# Patient Record
Sex: Male | Born: 1960 | Race: Black or African American | Hispanic: No | Marital: Single | Smoking: Current every day smoker
Health system: Southern US, Community
[De-identification: ages and names within clinical notes are randomized; demographics above are authoritative.]

## PROBLEM LIST (undated history)

## (undated) DIAGNOSIS — C801 Malignant (primary) neoplasm, unspecified: Secondary | ICD-10-CM

---

## 2015-09-04 ENCOUNTER — Emergency Department (HOSPITAL_COMMUNITY)
Admission: EM | Admit: 2015-09-04 | Discharge: 2015-09-04 | Disposition: A | Discharge status: Home / Self Care | Payer: BLUE CROSS/BLUE SHIELD | Attending: Emergency Medicine | Admitting: Emergency Medicine

## 2015-09-04 ENCOUNTER — Encounter (HOSPITAL_COMMUNITY): Payer: Self-pay | Admitting: Emergency Medicine

## 2015-09-04 ENCOUNTER — Emergency Department (HOSPITAL_COMMUNITY): Payer: BLUE CROSS/BLUE SHIELD

## 2015-09-04 DIAGNOSIS — F17210 Nicotine dependence, cigarettes, uncomplicated: Secondary | ICD-10-CM | POA: Insufficient documentation

## 2015-09-04 DIAGNOSIS — M25562 Pain in left knee: Secondary | ICD-10-CM | POA: Diagnosis not present

## 2015-09-04 MED ORDER — ACETAMINOPHEN 500 MG PO TABS
1000.0000 mg | ORAL_TABLET | Freq: Once | ORAL | Status: AC
  Administered 2015-09-04: 1000 mg via ORAL
  Filled 2015-09-04: qty 2

## 2015-09-04 MED ORDER — NAPROXEN 500 MG PO TABS: 500.0000 mg | ORAL_TABLET | Freq: Two times a day (BID) | ORAL | Status: DC

## 2015-09-04 MED ORDER — IBUPROFEN 400 MG PO TABS
800.0000 mg | ORAL_TABLET | Freq: Once | ORAL | Status: AC
  Administered 2015-09-04: 800 mg via ORAL
  Filled 2015-09-04: qty 2

## 2015-09-04 MED ORDER — ACETAMINOPHEN 500 MG PO TABS: 500.0000 mg | ORAL_TABLET | Freq: Four times a day (QID) | ORAL | Status: AC | PRN

## 2015-09-04 NOTE — ED Provider Notes (Signed)
CSN: [id number]     Arrival date & time 09/04/15  2993 History   First MD Initiated Contact with Patient 09/04/15 [phone]     Chief Complaint  Patient presents with  . Knee Pain     (Consider location/radiation/quality/duration/timing/severity/associated sxs/prior Treatment) Patient is a 55 y.o. male presenting with knee pain. The history is provided by the patient.  Knee Pain Location:  Knee Time since incident:  2 months Injury: no   Knee location:  L knee Pain details:    Quality:  Aching ("feels like someone stuck a knife in my knee and twisting it")   Radiates to:  Does not radiate   Severity:  Moderate   Onset quality:  Gradual   Timing:  Constant   Progression:  Worsening Dislocation: no   Prior injury to area:  No Relieved by:  NSAIDs Worsened by:  Nothing tried Associated symptoms: decreased ROM (secondary to pain)   Associated symptoms: no fever, no muscle weakness, no numbness, no swelling and no tingling    Patient presents with left knee pain x 2 months without known injury/trauma.  Patient ambulatory.  Pain relieved by [facility] powder, advil, and aleve.  No swelling, color change, fever, chills, numbness, or weakness.  History reviewed. No pertinent past medical history. History reviewed. No pertinent past surgical history. No family history on file. Social History  Substance Use Topics  . Smoking status: Current Every Day Smoker    Types: Cigars  . Smokeless tobacco: None  . Alcohol Use: Yes    Review of Systems  Constitutional: Negative for fever.  Cardiovascular: Negative for leg swelling.  Musculoskeletal: Positive for arthralgias (left knee) and gait problem (secondary to pain). Negative for joint swelling.  Skin: Negative for color change.  Neurological: Negative for weakness and numbness.  All other systems reviewed and are negative.     Allergies  Aleve  Home Medications   Prior to Admission medications   Medication Sig Start Date End Date  Taking? Authorizing Provider  acetaminophen (TYLENOL) 500 MG tablet Take 1 tablet (500 mg total) by mouth every 6 (six) hours as needed. 09/04/15   Gloriann Loan, PA-C  naproxen (NAPROSYN) 500 MG tablet Take 1 tablet (500 mg total) by mouth 2 (two) times daily. 09/04/15   Noble Bodie, PA-C   BP 118/70 mmHg  Pulse 51  Temp(Src) 97.7 F (36.5 C) (Oral)  Resp 18  SpO2 100% Physical Exam  Constitutional: He is oriented to person, place, and time. He appears well-developed and well-nourished.  HENT:  Head: Atraumatic.  Eyes: Conjunctivae are normal. No scleral icterus.  Neck: No tracheal deviation present.  Cardiovascular: Normal rate, regular rhythm and normal heart sounds.   Pulses:      Posterior tibial pulses are 2+ on the right side, and 2+ on the left side.  Pulmonary/Chest: Effort normal and breath sounds normal. No respiratory distress.  Abdominal: He exhibits no distension.  Musculoskeletal: He exhibits tenderness. He exhibits no edema.       Left knee: He exhibits decreased range of motion (secondary to pain). He exhibits no swelling, no effusion, no ecchymosis, no deformity, no laceration, no erythema, normal alignment, no LCL laxity, normal patellar mobility and no MCL laxity. Tenderness found.       Legs: Neurological: He is alert and oriented to person, place, and time.  Strength and sensation intact bilaterally throughout lower extremities.   Skin: Skin is warm and dry. No erythema.  No swelling, warmth, erythema.  Psychiatric: He  has a normal mood and affect. His behavior is normal.    ED Course  Procedures (including critical care time) Labs Review Labs Reviewed - No data to display  Imaging Review Dg Knee Complete 4 Views Left  09/04/2015  CLINICAL DATA:  55 year old male with knee pain for 2 months with no known injury. Initial encounter. EXAM: LEFT KNEE - COMPLETE 4+ VIEW COMPARISON:  None. FINDINGS: Mild medial compartment joint space loss. Lateral and patellofemoral  compartments are preserved. No significant degenerative spurring. There does appear to be suprapatellar soft tissue stranding, and anterior soft tissue thickening at the knee. Small joint effusion not excluded. Bone mineralization is within normal limits. Patella intact. No acute osseous abnormality identified. IMPRESSION: Mild degenerative changes at the medial compartment. Suprapatellar small joint effusion or soft tissue stranding. No acute osseous abnormality identified. Electronically Signed   By: Genevie Ann M.D.   On: 09/04/2015 10:18   I have personally reviewed and evaluated these images and lab results as part of my medical decision-making.   EKG Interpretation None      MDM   Final diagnoses:  Left knee pain    Patient presents with left knee pain x 2 months.  No injury.  No prior injury/surgery.  No swelling, warmth, erythema, ecchymosis.  Patient ambulatory.  TTP along lateral left knee.  Decreased ROM secondary to pain.  Intact distal pulses.  Will obtain plain films of left knee.  Patient given ibuprofen and tylenol in ED.    Plain films show mild degenerative changes at the medial compartment.  Suprapatellar small joint effusion or soft tissue stranding.  No acte osseous abnormality.    Patient given knee sleeve.  Recommend Aleve and Tylenol for pain.  Follow up orthopedics.  Discussed return precautions.  Patient agrees and acknowledges the above plan for discharge.    Gloriann Loan, PA-C 09/04/15 1036  Julianne Rice, MD 09/04/15 1539

## 2015-09-04 NOTE — Discharge Instructions (Signed)

## 2015-09-04 NOTE — ED Notes (Signed)
Patient states has had L knee pain x 2 months.   Patient states he has been taking Goody's, BC's, and advil for pain.   Patient denies injury.  Denies swelling.

## 2015-12-06 ENCOUNTER — Emergency Department (HOSPITAL_COMMUNITY): Payer: BLUE CROSS/BLUE SHIELD

## 2015-12-06 ENCOUNTER — Inpatient Hospital Stay (HOSPITAL_COMMUNITY)
Admission: EM | Admit: 2015-12-06 | Discharge: 2015-12-16 | DRG: 481 | Disposition: A | Discharge status: Rehabilitation Facility | Payer: BLUE CROSS/BLUE SHIELD | Attending: Internal Medicine | Admitting: Internal Medicine

## 2015-12-06 ENCOUNTER — Encounter (HOSPITAL_COMMUNITY): Payer: Self-pay | Admitting: Radiology

## 2015-12-06 ENCOUNTER — Inpatient Hospital Stay (HOSPITAL_COMMUNITY): Payer: BLUE CROSS/BLUE SHIELD

## 2015-12-06 DIAGNOSIS — Z419 Encounter for procedure for purposes other than remedying health state, unspecified: Secondary | ICD-10-CM

## 2015-12-06 DIAGNOSIS — D539 Nutritional anemia, unspecified: Secondary | ICD-10-CM | POA: Diagnosis present

## 2015-12-06 DIAGNOSIS — Z590 Homelessness: Secondary | ICD-10-CM | POA: Diagnosis not present

## 2015-12-06 DIAGNOSIS — S72302A Unspecified fracture of shaft of left femur, initial encounter for closed fracture: Secondary | ICD-10-CM | POA: Insufficient documentation

## 2015-12-06 DIAGNOSIS — R1084 Generalized abdominal pain: Secondary | ICD-10-CM | POA: Diagnosis not present

## 2015-12-06 DIAGNOSIS — E876 Hypokalemia: Secondary | ICD-10-CM | POA: Diagnosis not present

## 2015-12-06 DIAGNOSIS — D62 Acute posthemorrhagic anemia: Secondary | ICD-10-CM | POA: Diagnosis not present

## 2015-12-06 DIAGNOSIS — M7989 Other specified soft tissue disorders: Secondary | ICD-10-CM

## 2015-12-06 DIAGNOSIS — Z79899 Other long term (current) drug therapy: Secondary | ICD-10-CM | POA: Diagnosis not present

## 2015-12-06 DIAGNOSIS — M84552A Pathological fracture in neoplastic disease, left femur, initial encounter for fracture: Secondary | ICD-10-CM | POA: Diagnosis present

## 2015-12-06 DIAGNOSIS — M799 Soft tissue disorder, unspecified: Secondary | ICD-10-CM | POA: Diagnosis not present

## 2015-12-06 DIAGNOSIS — M25469 Effusion, unspecified knee: Secondary | ICD-10-CM

## 2015-12-06 DIAGNOSIS — T148XXA Other injury of unspecified body region, initial encounter: Secondary | ICD-10-CM | POA: Insufficient documentation

## 2015-12-06 DIAGNOSIS — E46 Unspecified protein-calorie malnutrition: Secondary | ICD-10-CM | POA: Diagnosis present

## 2015-12-06 DIAGNOSIS — C7931 Secondary malignant neoplasm of brain: Secondary | ICD-10-CM | POA: Diagnosis present

## 2015-12-06 DIAGNOSIS — S7292XA Unspecified fracture of left femur, initial encounter for closed fracture: Secondary | ICD-10-CM | POA: Diagnosis present

## 2015-12-06 DIAGNOSIS — Z681 Body mass index (BMI) 19 or less, adult: Secondary | ICD-10-CM

## 2015-12-06 DIAGNOSIS — Z01811 Encounter for preprocedural respiratory examination: Secondary | ICD-10-CM

## 2015-12-06 DIAGNOSIS — S7290XA Unspecified fracture of unspecified femur, initial encounter for closed fracture: Secondary | ICD-10-CM | POA: Diagnosis present

## 2015-12-06 DIAGNOSIS — M868X5 Other osteomyelitis, thigh: Secondary | ICD-10-CM | POA: Diagnosis not present

## 2015-12-06 DIAGNOSIS — R9431 Abnormal electrocardiogram [ECG] [EKG]: Secondary | ICD-10-CM

## 2015-12-06 DIAGNOSIS — R222 Localized swelling, mass and lump, trunk: Secondary | ICD-10-CM | POA: Diagnosis present

## 2015-12-06 DIAGNOSIS — R609 Edema, unspecified: Secondary | ICD-10-CM | POA: Diagnosis not present

## 2015-12-06 DIAGNOSIS — J432 Centrilobular emphysema: Secondary | ICD-10-CM | POA: Diagnosis present

## 2015-12-06 DIAGNOSIS — N5089 Other specified disorders of the male genital organs: Secondary | ICD-10-CM | POA: Insufficient documentation

## 2015-12-06 DIAGNOSIS — E279 Disorder of adrenal gland, unspecified: Secondary | ICD-10-CM | POA: Diagnosis present

## 2015-12-06 DIAGNOSIS — C799 Secondary malignant neoplasm of unspecified site: Secondary | ICD-10-CM | POA: Diagnosis present

## 2015-12-06 DIAGNOSIS — Z801 Family history of malignant neoplasm of trachea, bronchus and lung: Secondary | ICD-10-CM | POA: Diagnosis not present

## 2015-12-06 DIAGNOSIS — F17290 Nicotine dependence, other tobacco product, uncomplicated: Secondary | ICD-10-CM | POA: Diagnosis present

## 2015-12-06 DIAGNOSIS — C3490 Malignant neoplasm of unspecified part of unspecified bronchus or lung: Secondary | ICD-10-CM

## 2015-12-06 DIAGNOSIS — M79605 Pain in left leg: Secondary | ICD-10-CM | POA: Diagnosis present

## 2015-12-06 DIAGNOSIS — C3431 Malignant neoplasm of lower lobe, right bronchus or lung: Secondary | ICD-10-CM | POA: Diagnosis present

## 2015-12-06 DIAGNOSIS — C7972 Secondary malignant neoplasm of left adrenal gland: Secondary | ICD-10-CM | POA: Diagnosis not present

## 2015-12-06 DIAGNOSIS — R52 Pain, unspecified: Secondary | ICD-10-CM | POA: Insufficient documentation

## 2015-12-06 DIAGNOSIS — E871 Hypo-osmolality and hyponatremia: Secondary | ICD-10-CM | POA: Diagnosis not present

## 2015-12-06 DIAGNOSIS — Z9889 Other specified postprocedural states: Secondary | ICD-10-CM | POA: Diagnosis not present

## 2015-12-06 DIAGNOSIS — F4321 Adjustment disorder with depressed mood: Secondary | ICD-10-CM | POA: Diagnosis not present

## 2015-12-06 DIAGNOSIS — M25462 Effusion, left knee: Secondary | ICD-10-CM | POA: Diagnosis not present

## 2015-12-06 DIAGNOSIS — D179 Benign lipomatous neoplasm, unspecified: Secondary | ICD-10-CM | POA: Diagnosis not present

## 2015-12-06 DIAGNOSIS — Z515 Encounter for palliative care: Secondary | ICD-10-CM | POA: Diagnosis not present

## 2015-12-06 DIAGNOSIS — Z7189 Other specified counseling: Secondary | ICD-10-CM | POA: Diagnosis not present

## 2015-12-06 DIAGNOSIS — D638 Anemia in other chronic diseases classified elsewhere: Secondary | ICD-10-CM | POA: Diagnosis not present

## 2015-12-06 DIAGNOSIS — R64 Cachexia: Secondary | ICD-10-CM | POA: Diagnosis not present

## 2015-12-06 DIAGNOSIS — S72322A Displaced transverse fracture of shaft of left femur, initial encounter for closed fracture: Secondary | ICD-10-CM | POA: Diagnosis not present

## 2015-12-06 DIAGNOSIS — M84552S Pathological fracture in neoplastic disease, left femur, sequela: Secondary | ICD-10-CM | POA: Diagnosis not present

## 2015-12-06 DIAGNOSIS — C801 Malignant (primary) neoplasm, unspecified: Secondary | ICD-10-CM | POA: Diagnosis not present

## 2015-12-06 DIAGNOSIS — C7800 Secondary malignant neoplasm of unspecified lung: Secondary | ICD-10-CM

## 2015-12-06 DIAGNOSIS — C7801 Secondary malignant neoplasm of right lung: Secondary | ICD-10-CM | POA: Diagnosis not present

## 2015-12-06 DIAGNOSIS — N433 Hydrocele, unspecified: Secondary | ICD-10-CM | POA: Diagnosis present

## 2015-12-06 DIAGNOSIS — K5900 Constipation, unspecified: Secondary | ICD-10-CM | POA: Diagnosis present

## 2015-12-06 DIAGNOSIS — C7951 Secondary malignant neoplasm of bone: Secondary | ICD-10-CM | POA: Diagnosis not present

## 2015-12-06 LAB — HEPATIC FUNCTION PANEL
ALT: 8 U/L — ABNORMAL LOW (ref 17–63)
AST: 14 U/L — AB (ref 15–41)
Albumin: 2.9 g/dL — ABNORMAL LOW (ref 3.5–5.0)
Alkaline Phosphatase: 71 U/L (ref 38–126)
Total Bilirubin: 0.2 mg/dL — ABNORMAL LOW (ref 0.3–1.2)
Total Protein: 6.1 g/dL — ABNORMAL LOW (ref 6.5–8.1)

## 2015-12-06 LAB — CBC
HEMATOCRIT: 32.3 % — AB (ref 39.0–52.0)
HEMOGLOBIN: 10.6 g/dL — AB (ref 13.0–17.0)
MCH: 33.2 pg (ref 26.0–34.0)
MCHC: 32.8 g/dL (ref 30.0–36.0)
MCV: 101.3 fL — AB (ref 78.0–100.0)
Platelets: 314 10*3/uL (ref 150–400)
RBC: 3.19 MIL/uL — ABNORMAL LOW (ref 4.22–5.81)
RDW: 14 % (ref 11.5–15.5)
WBC: 5.6 10*3/uL (ref 4.0–10.5)

## 2015-12-06 LAB — BASIC METABOLIC PANEL
Anion gap: 10 (ref 5–15)
BUN: 13 mg/dL (ref 6–20)
CALCIUM: 8.5 mg/dL — AB (ref 8.9–10.3)
CHLORIDE: 105 mmol/L (ref 101–111)
CO2: 23 mmol/L (ref 22–32)
CREATININE: 0.64 mg/dL (ref 0.61–1.24)
GFR calc Af Amer: >60 mL/min (ref >60)
GFR calc non Af Amer: >60 mL/min (ref >60)
GLUCOSE: 92 mg/dL (ref 65–99)
Potassium: 3.6 mmol/L (ref 3.5–5.1)
Sodium: 138 mmol/L (ref 135–145)

## 2015-12-06 LAB — PSA: PSA: 0.86 ng/mL (ref 0.00–4.00)

## 2015-12-06 MED ORDER — HYDROMORPHONE HCL 1 MG/ML IJ SOLN
1.0000 mg | INTRAMUSCULAR | Status: DC | PRN
  Administered 2015-12-06 – 2015-12-07 (×4): 1 mg via INTRAVENOUS
  Filled 2015-12-06 (×3): qty 1

## 2015-12-06 MED ORDER — ENOXAPARIN SODIUM 40 MG/0.4ML ~~LOC~~ SOLN
40.0000 mg | SUBCUTANEOUS | Status: DC
  Administered 2015-12-06: 40 mg via SUBCUTANEOUS
  Filled 2015-12-06: qty 0.4

## 2015-12-06 MED ORDER — HYDROMORPHONE HCL 1 MG/ML IJ SOLN
1.0000 mg | Freq: Once | INTRAMUSCULAR | Status: AC
  Administered 2015-12-06: 1 mg via INTRAVENOUS
  Filled 2015-12-06: qty 1

## 2015-12-06 MED ORDER — DIATRIZOATE MEGLUMINE & SODIUM 66-10 % PO SOLN
ORAL | Status: AC
  Filled 2015-12-06: qty 30

## 2015-12-06 MED ORDER — ONDANSETRON HCL 4 MG/2ML IJ SOLN: 4.0000 mg | Freq: Four times a day (QID) | INTRAMUSCULAR | Status: DC | PRN

## 2015-12-06 MED ORDER — IOPAMIDOL (ISOVUE-300) INJECTION 61%
INTRAVENOUS | Status: AC
  Administered 2015-12-06: 100 mL
  Filled 2015-12-06: qty 100

## 2015-12-06 MED ORDER — HYDROMORPHONE HCL 1 MG/ML IJ SOLN
1.0000 mg | INTRAMUSCULAR | Status: DC | PRN
  Administered 2015-12-06: 1 mg via INTRAVENOUS
  Filled 2015-12-06: qty 1

## 2015-12-06 MED ORDER — PROMETHAZINE HCL 25 MG PO TABS: 25.0000 mg | ORAL_TABLET | Freq: Four times a day (QID) | ORAL | Status: DC | PRN

## 2015-12-06 MED ORDER — MORPHINE SULFATE (PF) 4 MG/ML IV SOLN
4.0000 mg | Freq: Once | INTRAVENOUS | Status: AC
  Administered 2015-12-06: 4 mg via INTRAVENOUS
  Filled 2015-12-06: qty 1

## 2015-12-06 NOTE — ED Notes (Signed)
Pt transported to CT ?

## 2015-12-06 NOTE — ED Notes (Signed)
CT notified to pick up patient per PA, to not wait for oral contrast

## 2015-12-06 NOTE — ED Provider Notes (Signed)
CSN: [id number]     Arrival date & time 12/06/15  1449 History   First MD Initiated Contact with Patient 12/06/15 1459     No chief complaint on file.    (Consider location/radiation/quality/duration/timing/severity/associated sxs/prior Treatment) HPI Comments: Patient presents to the ED with a chief complaint of left leg pain.  Patient states that he missed a small step at work and felt his left thigh pop.  It was followed by intense pain.  He has been unable to walk since the injury.  He states that he has chronic knee pain, but denies any new injury or pain to his left knee.  He denies any other symptoms.  The pain is worsened with palpation and movement.  The history is provided by the patient. No language interpreter was used.    No past medical history on file. No past surgical history on file. No family history on file. Social History  Substance Use Topics  . Smoking status: Current Every Day Smoker    Types: Cigars  . Smokeless tobacco: Not on file  . Alcohol Use: Yes    Review of Systems  Constitutional: Negative for fever and chills.  Respiratory: Negative for shortness of breath.   Cardiovascular: Negative for chest pain.  Gastrointestinal: Negative for nausea, vomiting, diarrhea and constipation.  Genitourinary: Negative for dysuria.  Musculoskeletal: Positive for myalgias and arthralgias.  All other systems reviewed and are negative.     Allergies  Aleve  Home Medications   Prior to Admission medications   Medication Sig Start Date End Date Taking? Authorizing Provider  acetaminophen (TYLENOL) 500 MG tablet Take 1 tablet (500 mg total) by mouth every 6 (six) hours as needed. 09/04/15   Gloriann Loan, PA-C  naproxen (NAPROSYN) 500 MG tablet Take 1 tablet (500 mg total) by mouth 2 (two) times daily. 09/04/15   Gloriann Loan, PA-C   There were no vitals taken for this visit. Physical Exam  Constitutional: He is oriented to person, place, and time. He appears  well-developed and well-nourished.  HENT:  Head: Normocephalic and atraumatic.  Eyes: Conjunctivae and EOM are normal. Pupils are equal, round, and reactive to light. Right eye exhibits no discharge. Left eye exhibits no discharge. No scleral icterus.  Neck: Normal range of motion. Neck supple. No JVD present.  Cardiovascular: Normal rate, regular rhythm and normal heart sounds.  Exam reveals no gallop and no friction rub.   No murmur heard. Pulmonary/Chest: Effort normal and breath sounds normal. No respiratory distress. He has no wheezes. He has no rales. He exhibits no tenderness.  Abdominal: Soft. He exhibits no distension and no mass. There is no tenderness. There is no rebound and no guarding.  Musculoskeletal: Normal range of motion. He exhibits no edema or tenderness.  Left thigh remarkable for obvious deformity, tender to palpation, no leg shortening, unable to extend knee, no bony abnormality or deformity, ROM and strength limited by pain  Neurological: He is alert and oriented to person, place, and time.  Skin: Skin is warm and dry.  Psychiatric: He has a normal mood and affect. His behavior is normal. Judgment and thought content normal.  Nursing note and vitals reviewed.   ED Course  Procedures (including critical care time) Results for orders placed or performed during the hospital encounter of 12/06/15  CBC  Result Value Ref Range   WBC 5.6 4.0 - 10.5 K/uL   RBC 3.19 (L) 4.22 - 5.81 MIL/uL   Hemoglobin 10.6 (L) 13.0 - 17.0 g/dL  HCT 32.3 (L) 39.0 - 52.0 %   MCV 101.3 (H) 78.0 - 100.0 fL   MCH 33.2 26.0 - 34.0 pg   MCHC 32.8 30.0 - 36.0 g/dL   RDW 14.0 11.5 - 15.5 %   Platelets 314 150 - 400 K/uL  Basic metabolic panel  Result Value Ref Range   Sodium 138 135 - 145 mmol/L   Potassium 3.6 3.5 - 5.1 mmol/L   Chloride 105 101 - 111 mmol/L   CO2 23 22 - 32 mmol/L   Glucose, Bld 92 65 - 99 mg/dL   BUN 13 6 - 20 mg/dL   Creatinine, Ser 0.64 0.61 - 1.24 mg/dL   Calcium  8.5 (L) 8.9 - 10.3 mg/dL   GFR calc non Af Amer >60 >60 mL/min   GFR calc Af Amer >60 >60 mL/min   Anion gap 10 5 - 15  PSA  Result Value Ref Range   PSA 0.86 0.00 - 4.00 ng/mL  Hepatic function panel  Result Value Ref Range   Total Protein 6.1 (L) 6.5 - 8.1 g/dL   Albumin 2.9 (L) 3.5 - 5.0 g/dL   AST 14 (L) 15 - 41 U/L   ALT 8 (L) 17 - 63 U/L   Alkaline Phosphatase 71 38 - 126 U/L   Total Bilirubin 0.2 (L) 0.3 - 1.2 mg/dL   Bilirubin, Direct <0.1 (L) 0.1 - 0.5 mg/dL   Indirect Bilirubin NOT CALCULATED 0.3 - 0.9 mg/dL   Dg Chest 1 View  12/06/2015  CLINICAL DATA:  Left femur fracture. Preoperative respiratory examination. EXAM: CHEST 1 VIEW COMPARISON:  None. FINDINGS: The cardiomediastinal silhouette is unremarkable. Pulmonary vascular congestion is noted. There is no evidence of focal airspace disease, pulmonary edema, suspicious pulmonary nodule/mass, pleural effusion, or pneumothorax. No acute bony abnormalities are identified. IMPRESSION: Pulmonary vascular congestion. Electronically Signed   By: Margarette Canada M.D.   On: 12/06/2015 16:35   Ct Chest W Contrast  12/06/2015  CLINICAL DATA:  Multiple myeloma.  Fell today. EXAM: CT CHEST, ABDOMEN, AND PELVIS WITH CONTRAST TECHNIQUE: Multidetector CT imaging of the chest, abdomen and pelvis was performed following the standard protocol during bolus administration of intravenous contrast. CONTRAST:  122m ISOVUE-300 IOPAMIDOL (ISOVUE-300) INJECTION 61% COMPARISON:  None. FINDINGS: CT CHEST No pneumothorax. No effusions. No acute fracture. No intrathoracic vascular injury. No mediastinal or hilar adenopathy. Axillary regions are unremarkable. There is a 2.5 x 3.6 cm mass in the posterior basal segment of the right lower lobe, suspicious for neoplasm. Extensive centrilobular emphysematous changes are present in the upper lobes. CT ABDOMEN AND PELVIS No acute traumatic findings. No peritoneal blood or free air. Liver, spleen, pancreas and kidneys are  unremarkable and intact. Aorta and IVC are normal in caliber, intact. There are adrenal masses bilaterally, measuring approximately 2 cm. There also is a 2.2 cm mass at the upper pole the right kidney which might represent a far posterior right adrenal nodule. The adrenals are poorly defined due to the marked paucity of surrounding fat. Bowel is unremarkable. Small volume ascites. No acute fracture is evident in the chest, abdomen or pelvis. There is a destructive bone lesion with associated soft tissue mass at the anterior aspect of the left femur greater trochanter, seen to best advantage on coronal images 76 series 304. This soft tissue component measures 1.9 x 2.5 cm. There also are lytic defects of the left femoral subcapital and intertrochanteric regions which are worrisome for risk of pathologic fracture. IMPRESSION: 1. No evidence  of acute traumatic injury in the chest, abdomen or pelvis. 2. 2.5 x 3.6 cm right lower lobe lung mass, suspicious for neoplasm. 3. Bilateral adrenal masses. There also is a 2.2 cm right suprarenal mass more posteriorly which may represent an additional adrenal mass. 4. Lytic lesions of the left femoral neck and intertrochanteric region, worrisome for risk for pathologic fracture. Electronically Signed   By: Andreas Newport M.D.   On: 12/06/2015 19:46   Ct Abdomen Pelvis W Contrast  12/06/2015  CLINICAL DATA:  Multiple myeloma.  Fell today. EXAM: CT CHEST, ABDOMEN, AND PELVIS WITH CONTRAST TECHNIQUE: Multidetector CT imaging of the chest, abdomen and pelvis was performed following the standard protocol during bolus administration of intravenous contrast. CONTRAST:  158m ISOVUE-300 IOPAMIDOL (ISOVUE-300) INJECTION 61% COMPARISON:  None. FINDINGS: CT CHEST No pneumothorax. No effusions. No acute fracture. No intrathoracic vascular injury. No mediastinal or hilar adenopathy. Axillary regions are unremarkable. There is a 2.5 x 3.6 cm mass in the posterior basal segment of the right  lower lobe, suspicious for neoplasm. Extensive centrilobular emphysematous changes are present in the upper lobes. CT ABDOMEN AND PELVIS No acute traumatic findings. No peritoneal blood or free air. Liver, spleen, pancreas and kidneys are unremarkable and intact. Aorta and IVC are normal in caliber, intact. There are adrenal masses bilaterally, measuring approximately 2 cm. There also is a 2.2 cm mass at the upper pole the right kidney which might represent a far posterior right adrenal nodule. The adrenals are poorly defined due to the marked paucity of surrounding fat. Bowel is unremarkable. Small volume ascites. No acute fracture is evident in the chest, abdomen or pelvis. There is a destructive bone lesion with associated soft tissue mass at the anterior aspect of the left femur greater trochanter, seen to best advantage on coronal images 76 series 304. This soft tissue component measures 1.9 x 2.5 cm. There also are lytic defects of the left femoral subcapital and intertrochanteric regions which are worrisome for risk of pathologic fracture. IMPRESSION: 1. No evidence of acute traumatic injury in the chest, abdomen or pelvis. 2. 2.5 x 3.6 cm right lower lobe lung mass, suspicious for neoplasm. 3. Bilateral adrenal masses. There also is a 2.2 cm right suprarenal mass more posteriorly which may represent an additional adrenal mass. 4. Lytic lesions of the left femoral neck and intertrochanteric region, worrisome for risk for pathologic fracture. Electronically Signed   By: DAndreas NewportM.D.   On: 12/06/2015 19:46   Ct Femur Left Wo Contrast  12/06/2015  CLINICAL DATA:  55year old male with a history of fracture left femur EXAM: CT OF THE LOWER LEFT EXTREMITY WITHOUT CONTRAST TECHNIQUE: Multidetector CT imaging of the was performed according to the standard protocol. COMPARISON:  Plain film of today's date. Contemporaneously CT of the abdomen/pelvis FINDINGS: Transverse fracture in the mid femur with  approximately 26 degrees of lateral angulation at the fracture site. There is medial displacement of the distal fracture fragment by 1/2 shaft width. Soft tissue somewhat limited by the exclusion of IV contrast, however, there is abnormal soft tissue centered at the site of the fracture involving the type low ex base and expanding outside of the cortex circumferentially at the fracture site. Differentiating abnormal/pathologic soft tissue from hematoma is difficult. Permeative changes of the cortex at the fracture site with mild periosteal reaction and a wide zone of transition at the lesion. Lytic lesion at the greater trochanter, the femoral head neck junction, and the posterior femoral neck. Partially imaged  right hydrocele versus lesion. IMPRESSION: Pathologic fracture at the mid left femoral diaphysis, with approximately 26 degrees of angulation at the fracture site, and permeative bone changes of the cortex. There is abnormal soft tissue within the medullary space centered at the fracture site, with circumferential soft tissue about the femur, and a wide zone of transition. The soft tissue may either represent a primary osteosarcoma, or metastatic disease. Additional lytic lesions of the femur involves the femoral neck junction, posterior femoral neck, and the greater trochanter, compatible with metastatic disease. Partially imaged right-sided hydrocele versus scrotal lesion. Recommend correlation with physical exam. Signed, Dulcy Fanny. Earleen Newport, DO [facility] Electronically Signed   By: Corrie Mckusick D.O.   On: 12/06/2015 19:46   Dg Femur Min 2 Views Left  12/06/2015  CLINICAL DATA:  Patient twisted leg with mid femur pain. Initial encounter. EXAM: LEFT FEMUR 2 VIEWS COMPARISON:  None. FINDINGS: There is a transverse displaced fracture through the mid femoral diaphysis. There is an irregular lytic lesion involving the mid femoral diaphysis at the  fracture site with overlying periosteal reaction. Overlying soft tissue swelling. IMPRESSION: Displaced transverse fracture through the mid femoral diaphysis. Irregular lytic lesion and periosteal reaction involving the mid femoral diaphysis at the fracture site is concerning for the possibility of metastatic disease or multiple myeloma. Electronically Signed   By: Lovey Newcomer M.D.   On: 12/06/2015 16:17    I have personally reviewed and evaluated these images and lab results as part of my medical decision-making.   EKG Interpretation   Date/Time:  Saturday Dec 06 2015 18:21:26 EDT Ventricular Rate:  62 PR Interval:  149 QRS Duration: 109 QT Interval:  415 QTC Calculation: 421 R Axis:   71 Text Interpretation:  Sinus rhythm Non-specific ST-t changes No previous  tracing Confirmed by Ashok Cordia  MD, Lennette Bihari ([id number]) on 12/06/2015 6:43:00 PM      MDM   Final diagnoses:  Fracture  Closed fracture of shaft of left femur, unspecified fracture morphology, initial encounter [facility])    Patient with left femur pain.  He felt a pop and twist in his left thigh.  Has been unable to walk since then.  Cannot extend knee. Very tender to palpation. Will check plain films.  Distal pulses intact.  Femur fracture as described above seen on plain films. Patient discussed with Dr. Ninfa Linden of orthopedics, who recommends medicine admission, as this is thought to be a pathologic fracture. Recommend CT scan of femur, also chest, abdomen, and pelvis. Recommends SPEP, UPEP, and PSA.  Also states that patient will need to be placed in Buck's Traction at 10 pounds after CT scan.  Appreciate internal medicine resident service for admitting the patient.      Montine Circle, PA-C 12/06/15 2131  Lajean Saver, MD 12/07/15 [phone]

## 2015-12-06 NOTE — ED Notes (Signed)
Went in to room to assess pt, pt in xray

## 2015-12-06 NOTE — Consult Note (Signed)
Reason for Consult:  Left femur fracture Referring Physician:   EDP Ashok Cordia, MD  Jung Yurchak is an 55 y.o. male.  HPI:   55 yo male who injured his femur just stepping wrong.  Felt a pop in his leg and was unable to ambulate. He denies previous left thigh or left hip pain, but does report left knee pain.  He states that he has felt minimal fatigue recently, but has experienced some weight loss.  He is a smoker.  History reviewed. No pertinent past medical history.  No past surgical history on file.  Family History  Problem Relation Age of Onset  . Hypertension Mother   . Heart disease Mother   . Diabetes Mother   . Lung cancer Father     Social History:  reports that he has been smoking Cigars.  He does not have any smokeless tobacco history on file. He reports that he drinks alcohol. He reports that he does not use illicit drugs.  Allergies:  Allergies  Allergen Reactions  . Aleve [Naproxen Sodium] Itching         Medications: I have reviewed the patient's current medications.  Results for orders placed or performed during the hospital encounter of 12/06/15 (from the past 48 hour(s))  CBC     Status: Abnormal   Collection Time: 12/06/15  4:20 PM  Result Value Ref Range   WBC 5.6 4.0 - 10.5 K/uL   RBC 3.19 (L) 4.22 - 5.81 MIL/uL   Hemoglobin 10.6 (L) 13.0 - 17.0 g/dL   HCT 32.3 (L) 39.0 - 52.0 %   MCV 101.3 (H) 78.0 - 100.0 fL   MCH 33.2 26.0 - 34.0 pg   MCHC 32.8 30.0 - 36.0 g/dL   RDW 14.0 11.5 - 15.5 %   Platelets 314 150 - 400 K/uL  Basic metabolic panel     Status: Abnormal   Collection Time: 12/06/15  4:20 PM  Result Value Ref Range   Sodium 138 135 - 145 mmol/L   Potassium 3.6 3.5 - 5.1 mmol/L   Chloride 105 101 - 111 mmol/L   CO2 23 22 - 32 mmol/L   Glucose, Bld 92 65 - 99 mg/dL   BUN 13 6 - 20 mg/dL   Creatinine, Ser 0.64 0.61 - 1.24 mg/dL   Calcium 8.5 (L) 8.9 - 10.3 mg/dL   GFR calc non Af Amer >60 >60 mL/min   GFR calc Af Amer >60 >60 mL/min     Comment: (NOTE) The eGFR has been calculated using the CKD EPI equation. This calculation has not been validated in all clinical situations. eGFR's persistently <60 mL/min signify possible Chronic Kidney Disease.    Anion gap 10 5 - 15  PSA     Status: None   Collection Time: 12/06/15  5:30 PM  Result Value Ref Range   PSA 0.86 0.00 - 4.00 ng/mL    Comment: (NOTE) While PSA levels of <=4.0 ng/ml are reported as reference range, some men with levels below 4.0 ng/ml can have prostate cancer and many men with PSA above 4.0 ng/ml do not have prostate cancer.  Other tests such as free PSA, age specific reference ranges, PSA velocity and PSA doubling time may be helpful especially in men less than 49 years old.   Hepatic function panel     Status: Abnormal   Collection Time: 12/06/15  7:09 PM  Result Value Ref Range   Total Protein 6.1 (L) 6.5 - 8.1 g/dL  Albumin 2.9 (L) 3.5 - 5.0 g/dL   AST 14 (L) 15 - 41 U/L   ALT 8 (L) 17 - 63 U/L   Alkaline Phosphatase 71 38 - 126 U/L   Total Bilirubin 0.2 (L) 0.3 - 1.2 mg/dL   Bilirubin, Direct <0.1 (L) 0.1 - 0.5 mg/dL   Indirect Bilirubin NOT CALCULATED 0.3 - 0.9 mg/dL    Dg Chest 1 View  12/06/2015  CLINICAL DATA:  Left femur fracture. Preoperative respiratory examination. EXAM: CHEST 1 VIEW COMPARISON:  None. FINDINGS: The cardiomediastinal silhouette is unremarkable. Pulmonary vascular congestion is noted. There is no evidence of focal airspace disease, pulmonary edema, suspicious pulmonary nodule/mass, pleural effusion, or pneumothorax. No acute bony abnormalities are identified. IMPRESSION: Pulmonary vascular congestion. Electronically Signed   By: Margarette Canada M.D.   On: 12/06/2015 16:35   Ct Chest W Contrast  12/06/2015  CLINICAL DATA:  Multiple myeloma.  Fell today. EXAM: CT CHEST, ABDOMEN, AND PELVIS WITH CONTRAST TECHNIQUE: Multidetector CT imaging of the chest, abdomen and pelvis was performed following the standard protocol during bolus  administration of intravenous contrast. CONTRAST:  146m ISOVUE-300 IOPAMIDOL (ISOVUE-300) INJECTION 61% COMPARISON:  None. FINDINGS: CT CHEST No pneumothorax. No effusions. No acute fracture. No intrathoracic vascular injury. No mediastinal or hilar adenopathy. Axillary regions are unremarkable. There is a 2.5 x 3.6 cm mass in the posterior basal segment of the right lower lobe, suspicious for neoplasm. Extensive centrilobular emphysematous changes are present in the upper lobes. CT ABDOMEN AND PELVIS No acute traumatic findings. No peritoneal blood or free air. Liver, spleen, pancreas and kidneys are unremarkable and intact. Aorta and IVC are normal in caliber, intact. There are adrenal masses bilaterally, measuring approximately 2 cm. There also is a 2.2 cm mass at the upper pole the right kidney which might represent a far posterior right adrenal nodule. The adrenals are poorly defined due to the marked paucity of surrounding fat. Bowel is unremarkable. Small volume ascites. No acute fracture is evident in the chest, abdomen or pelvis. There is a destructive bone lesion with associated soft tissue mass at the anterior aspect of the left femur greater trochanter, seen to best advantage on coronal images 76 series 304. This soft tissue component measures 1.9 x 2.5 cm. There also are lytic defects of the left femoral subcapital and intertrochanteric regions which are worrisome for risk of pathologic fracture. IMPRESSION: 1. No evidence of acute traumatic injury in the chest, abdomen or pelvis. 2. 2.5 x 3.6 cm right lower lobe lung mass, suspicious for neoplasm. 3. Bilateral adrenal masses. There also is a 2.2 cm right suprarenal mass more posteriorly which may represent an additional adrenal mass. 4. Lytic lesions of the left femoral neck and intertrochanteric region, worrisome for risk for pathologic fracture. Electronically Signed   By: DAndreas NewportM.D.   On: 12/06/2015 19:46   Ct Abdomen Pelvis W  Contrast  12/06/2015  CLINICAL DATA:  Multiple myeloma.  Fell today. EXAM: CT CHEST, ABDOMEN, AND PELVIS WITH CONTRAST TECHNIQUE: Multidetector CT imaging of the chest, abdomen and pelvis was performed following the standard protocol during bolus administration of intravenous contrast. CONTRAST:  1063mISOVUE-300 IOPAMIDOL (ISOVUE-300) INJECTION 61% COMPARISON:  None. FINDINGS: CT CHEST No pneumothorax. No effusions. No acute fracture. No intrathoracic vascular injury. No mediastinal or hilar adenopathy. Axillary regions are unremarkable. There is a 2.5 x 3.6 cm mass in the posterior basal segment of the right lower lobe, suspicious for neoplasm. Extensive centrilobular emphysematous changes are present in  the upper lobes. CT ABDOMEN AND PELVIS No acute traumatic findings. No peritoneal blood or free air. Liver, spleen, pancreas and kidneys are unremarkable and intact. Aorta and IVC are normal in caliber, intact. There are adrenal masses bilaterally, measuring approximately 2 cm. There also is a 2.2 cm mass at the upper pole the right kidney which might represent a far posterior right adrenal nodule. The adrenals are poorly defined due to the marked paucity of surrounding fat. Bowel is unremarkable. Small volume ascites. No acute fracture is evident in the chest, abdomen or pelvis. There is a destructive bone lesion with associated soft tissue mass at the anterior aspect of the left femur greater trochanter, seen to best advantage on coronal images 76 series 304. This soft tissue component measures 1.9 x 2.5 cm. There also are lytic defects of the left femoral subcapital and intertrochanteric regions which are worrisome for risk of pathologic fracture. IMPRESSION: 1. No evidence of acute traumatic injury in the chest, abdomen or pelvis. 2. 2.5 x 3.6 cm right lower lobe lung mass, suspicious for neoplasm. 3. Bilateral adrenal masses. There also is a 2.2 cm right suprarenal mass more posteriorly which may represent an  additional adrenal mass. 4. Lytic lesions of the left femoral neck and intertrochanteric region, worrisome for risk for pathologic fracture. Electronically Signed   By: Andreas Newport M.D.   On: 12/06/2015 19:46   Ct Femur Left Wo Contrast  12/06/2015  CLINICAL DATA:  55 year old male with a history of fracture left femur EXAM: CT OF THE LOWER LEFT EXTREMITY WITHOUT CONTRAST TECHNIQUE: Multidetector CT imaging of the was performed according to the standard protocol. COMPARISON:  Plain film of today's date. Contemporaneously CT of the abdomen/pelvis FINDINGS: Transverse fracture in the mid femur with approximately 26 degrees of lateral angulation at the fracture site. There is medial displacement of the distal fracture fragment by 1/2 shaft width. Soft tissue somewhat limited by the exclusion of IV contrast, however, there is abnormal soft tissue centered at the site of the fracture involving the type low ex base and expanding outside of the cortex circumferentially at the fracture site. Differentiating abnormal/pathologic soft tissue from hematoma is difficult. Permeative changes of the cortex at the fracture site with mild periosteal reaction and a wide zone of transition at the lesion. Lytic lesion at the greater trochanter, the femoral head neck junction, and the posterior femoral neck. Partially imaged right hydrocele versus lesion. IMPRESSION: Pathologic fracture at the mid left femoral diaphysis, with approximately 26 degrees of angulation at the fracture site, and permeative bone changes of the cortex. There is abnormal soft tissue within the medullary space centered at the fracture site, with circumferential soft tissue about the femur, and a wide zone of transition. The soft tissue may either represent a primary osteosarcoma, or metastatic disease. Additional lytic lesions of the femur involves the femoral neck junction, posterior femoral neck, and the greater trochanter, compatible with metastatic  disease. Partially imaged right-sided hydrocele versus scrotal lesion. Recommend correlation with physical exam. Signed, Dulcy Fanny. Earleen Newport, DO [facility] Radiology Electronically Signed   By: Corrie Mckusick D.O.   On: 12/06/2015 19:46   Dg Femur Min 2 Views Left  12/06/2015  CLINICAL DATA:  Patient twisted leg with mid femur pain. Initial encounter. EXAM: LEFT FEMUR 2 VIEWS COMPARISON:  None. FINDINGS: There is a transverse displaced fracture through the mid femoral diaphysis. There is an irregular lytic lesion involving the mid femoral diaphysis at the fracture  site with overlying periosteal reaction. Overlying soft tissue swelling. IMPRESSION: Displaced transverse fracture through the mid femoral diaphysis. Irregular lytic lesion and periosteal reaction involving the mid femoral diaphysis at the fracture site is concerning for the possibility of metastatic disease or multiple myeloma. Electronically Signed   By: Lovey Newcomer M.D.   On: 12/06/2015 16:17    Review of Systems  Constitutional: Positive for weight loss.  All other systems reviewed and are negative.  Blood pressure 135/79, pulse 62, temperature 98.6 F (37 C), temperature source Oral, resp. rate 16, height _0  (1.981 m), weight 73.211 kg (161 lb 6.4 oz), SpO2 100 %. Physical Exam  Constitutional: He is oriented to person, place, and time. He appears well-developed.  HENT:  Head: Normocephalic and atraumatic.  Eyes: EOM are normal. Pupils are equal, round, and reactive to light.  Neck: Neck supple.  Cardiovascular: Normal rate and regular rhythm.   Respiratory: Effort normal.  GI: Soft. Bowel sounds are normal.  Musculoskeletal:       Left upper leg: He exhibits tenderness, bony tenderness, swelling, edema and deformity.  Neurological: He is alert and oriented to person, place, and time.  Skin: Skin is warm and dry.  Psychiatric: He has a normal mood and affect.     Assessment/Plan: Pathologic left femur shaft fracture thru lytic lesion 1)  I have spoken to Mr. Seider in length ad informed him of his diagnosis.  The CT scans due show lesions in the left proximal femur as well as his lungs.  This is certainly a metastatic process and needs a full metastatic workup prior to proceeding to the OR for surgical stabilization of his femur.  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  Less likely is this an osteosarcoma with metastasis to the lungs.  However, a definitive diagnosis is needed first.  Will likely need involvement/input for Oncology.  Also, will likely need a CT guided biopsy of the femur lesion first to determine a diagnosis.  Will follow closely.  Jerrad Mendibles Y 12/06/2015, 10:19 PM

## 2015-12-06 NOTE — ED Notes (Addendum)
Pt arrived by [facility]. Pt was walking and stepped on a ledge, twisted left leg. Has swelling to left thigh. Given 253mg fentanyl pta.

## 2015-12-06 NOTE — Progress Notes (Deleted)
Date: 12/06/2015               Patient Name:  Marvin Hardy MRN: [id number]  DOB: 1961/06/30 Age / Sex: 55 y.o., male   PCP: No Pcp Per Patient         Medical Service: Internal Medicine Teaching Service         Attending Physician: Dr. Aldine Contes, MD    First Contact: Dr. Tiburcio Pea  Pager: [phone]  Second Contact: Dr. Randell Patient  Pager: [phone]       After Hours (After 5p/  First Contact Pager: [phone]  weekends / holidays): Second Contact Pager: [phone]   Chief Complaint: pain in LLE  History of Present Illness: Pt is a 55 yo M with no significant past medical history presenting to the hospital with pain in his left lower extremity. Patient states he was at work and twisted his left leg while trying to turn while walking down a step. States he heard his "bone pop" when this happened. Denies having any history of fractures in the past. Reports having a torn meniscus in his left knee for the past 2-3 months and is supposed to be getting surgery done sometime later this month. He denies having any recent fevers, change in appetite, cough, or hemoptysis. Reports having night sweats for the past few weeks and 7 pound weight loss (it is not clear from the history since when patient has been losing weight). Also reports having swelling in his testicle for the past 1 month. He also noticed a swelling on his left chest area a few weeks ago.   Meds: Current Facility-Administered Medications  Medication Dose Route Frequency Provider Last Rate Last Dose  . diatrizoate meglumine-sodium (GASTROGRAFIN) 66-10 % solution           . enoxaparin (LOVENOX) injection 40 mg  40 mg Subcutaneous Q24H Shela Leff, MD      . HYDROmorphone (DILAUDID) injection 1 mg  1 mg Intravenous Q2H PRN Shela Leff, MD        Allergies: Allergies as of 12/06/2015 - Review Complete 12/06/2015  Allergen Reaction Noted  . Aleve [naproxen sodium] Itching 09/04/2015   History reviewed. No pertinent past medical  history. No past surgical history on file. Family History  Problem Relation Age of Onset  . Hypertension Mother   . Heart disease Mother   . Diabetes Mother   . Lung cancer Father    Social History   Social History  . Marital Status: Single    Spouse Name: N/A  . Number of Children: N/A  . Years of Education: N/A   Occupational History  . Not on file.   Social History Main Topics  . Smoking status: Current Every Day Smoker    Types: Cigars  . Smokeless tobacco: Not on file     Comment: Smoking 1 cigar per day for the past 25 yrs  . Alcohol Use: 0.0 oz/week    0 Standard drinks or equivalent per week     Comment: Beer on weekends (patient is not sure how much)  . Drug Use: No  . Sexual Activity: Not on file   Other Topics Concern  . Not on file   Social History Narrative    Review of Systems: Review of Systems  Constitutional: Positive for weight loss. Negative for fever, chills and malaise/fatigue.       Night sweats  HENT: Negative for congestion and sore throat.   Eyes: Negative for blurred vision and pain.  Respiratory: Negative for cough, hemoptysis, sputum production, shortness of breath and wheezing.   Cardiovascular: Negative for chest pain, palpitations and leg swelling.  Gastrointestinal: Negative for nausea, vomiting, abdominal pain, blood in stool and melena.  Genitourinary: Negative for dysuria, hematuria and flank pain.  Musculoskeletal: Positive for joint pain. Negative for myalgias.       Pain in left knee and left femur  Skin: Negative for itching and rash.  Neurological: Negative for dizziness, focal weakness and headaches.    Physical Exam: Blood pressure 135/79, pulse 62, temperature 98.6 F (37 C), temperature source Oral, resp. rate 16, height _0  (1.981 m), weight 161 lb 6.4 oz (73.211 kg), SpO2 100 %. Physical Exam  Constitutional: He is oriented to person, place, and time. He appears well-developed and well-nourished. No distress.    HENT:  Head: Normocephalic and atraumatic.  Mouth/Throat: Oropharynx is clear and moist.  Eyes: EOM are normal. Pupils are equal, round, and reactive to light.  Neck: Normal range of motion. Neck supple. No tracheal deviation present.  Axillary lymphadenopathy  Cardiovascular: Normal rate, regular rhythm and intact distal pulses.  Exam reveals no gallop and no friction rub.   No murmur heard. Pulmonary/Chest: Effort normal and breath sounds normal. No respiratory distress. He has no wheezes. He has no rales.  Anterior lung fields clear to auscultation.  Abdominal: Soft. Bowel sounds are normal. He exhibits no distension. There is no tenderness. There is no rebound and no guarding.  No organomegaly  Genitourinary:  Right testicle is enlarged Inguinal lymphadenopathy present  Musculoskeletal: He exhibits no edema.  Closed fracture of left femur 10 x 10 cm nontender firm area noted at the left posterior axillary line lateral and inferior to the left scapula  Lymphadenopathy:    He has no cervical adenopathy.  Neurological: He is alert and oriented to person, place, and time.  Patient is able to wiggle his toes on the left and has +2 dorsalis pedis pulse on the left. Sensation to light touch is grossly intact as well.   Skin: Skin is warm and dry. No rash noted. No erythema.  Several 1x1 cm nontender nodules on the sternum   Lab results: Basic Metabolic Panel:  Recent Labs  12/06/15 1620  NA 138  K 3.6  CL 105  CO2 23  GLUCOSE 92  BUN 13  CREATININE 0.64  CALCIUM 8.5*   Liver Function Tests:  Recent Labs  12/06/15 1909  AST 14*  ALT 8*  ALKPHOS 71  BILITOT 0.2*  PROT 6.1*  ALBUMIN 2.9*   CBC:  Recent Labs  12/06/15 1620  WBC 5.6  HGB 10.6*  HCT 32.3*  MCV 101.3*  PLT 314   Imaging results:  Dg Chest 1 View  12/06/2015  CLINICAL DATA:  Left femur fracture. Preoperative respiratory examination. EXAM: CHEST 1 VIEW COMPARISON:  None. FINDINGS: The  cardiomediastinal silhouette is unremarkable. Pulmonary vascular congestion is noted. There is no evidence of focal airspace disease, pulmonary edema, suspicious pulmonary nodule/mass, pleural effusion, or pneumothorax. No acute bony abnormalities are identified. IMPRESSION: Pulmonary vascular congestion. Electronically Signed   By: Margarette Canada M.D.   On: 12/06/2015 16:35   Ct Chest W Contrast  12/06/2015  CLINICAL DATA:  Multiple myeloma.  Fell today. EXAM: CT CHEST, ABDOMEN, AND PELVIS WITH CONTRAST TECHNIQUE: Multidetector CT imaging of the chest, abdomen and pelvis was performed following the standard protocol during bolus administration of intravenous contrast. CONTRAST:  114m ISOVUE-300 IOPAMIDOL (ISOVUE-300) INJECTION 61% COMPARISON:  None. FINDINGS: CT CHEST No pneumothorax. No effusions. No acute fracture. No intrathoracic vascular injury. No mediastinal or hilar adenopathy. Axillary regions are unremarkable. There is a 2.5 x 3.6 cm mass in the posterior basal segment of the right lower lobe, suspicious for neoplasm. Extensive centrilobular emphysematous changes are present in the upper lobes. CT ABDOMEN AND PELVIS No acute traumatic findings. No peritoneal blood or free air. Liver, spleen, pancreas and kidneys are unremarkable and intact. Aorta and IVC are normal in caliber, intact. There are adrenal masses bilaterally, measuring approximately 2 cm. There also is a 2.2 cm mass at the upper pole the right kidney which might represent a far posterior right adrenal nodule. The adrenals are poorly defined due to the marked paucity of surrounding fat. Bowel is unremarkable. Small volume ascites. No acute fracture is evident in the chest, abdomen or pelvis. There is a destructive bone lesion with associated soft tissue mass at the anterior aspect of the left femur greater trochanter, seen to best advantage on coronal images 76 series 304. This soft tissue component measures 1.9 x 2.5 cm. There also are lytic  defects of the left femoral subcapital and intertrochanteric regions which are worrisome for risk of pathologic fracture. IMPRESSION: 1. No evidence of acute traumatic injury in the chest, abdomen or pelvis. 2. 2.5 x 3.6 cm right lower lobe lung mass, suspicious for neoplasm. 3. Bilateral adrenal masses. There also is a 2.2 cm right suprarenal mass more posteriorly which may represent an additional adrenal mass. 4. Lytic lesions of the left femoral neck and intertrochanteric region, worrisome for risk for pathologic fracture. Electronically Signed   By: Daniel R Pigman M.D.   On: 12/06/2015 19:46   Ct Abdomen Pelvis W Contrast  12/06/2015  CLINICAL DATA:  Multiple myeloma.  Fell today. EXAM: CT CHEST, ABDOMEN, AND PELVIS WITH CONTRAST TECHNIQUE: Multidetector CT imaging of the chest, abdomen and pelvis was performed following the standard protocol during bolus administration of intravenous contrast. CONTRAST:  100mL ISOVUE-300 IOPAMIDOL (ISOVUE-300) INJECTION 61% COMPARISON:  None. FINDINGS: CT CHEST No pneumothorax. No effusions. No acute fracture. No intrathoracic vascular injury. No mediastinal or hilar adenopathy. Axillary regions are unremarkable. There is a 2.5 x 3.6 cm mass in the posterior basal segment of the right lower lobe, suspicious for neoplasm. Extensive centrilobular emphysematous changes are present in the upper lobes. CT ABDOMEN AND PELVIS No acute traumatic findings. No peritoneal blood or free air. Liver, spleen, pancreas and kidneys are unremarkable and intact. Aorta and IVC are normal in caliber, intact. There are adrenal masses bilaterally, measuring approximately 2 cm. There also is a 2.2 cm mass at the upper pole the right kidney which might represent a far posterior right adrenal nodule. The adrenals are poorly defined due to the marked paucity of surrounding fat. Bowel is unremarkable. Small volume ascites. No acute fracture is evident in the chest, abdomen or pelvis. There is a  destructive bone lesion with associated soft tissue mass at the anterior aspect of the left femur greater trochanter, seen to best advantage on coronal images 76 series 304. This soft tissue component measures 1.9 x 2.5 cm. There also are lytic defects of the left femoral subcapital and intertrochanteric regions which are worrisome for risk of pathologic fracture. IMPRESSION: 1. No evidence of acute traumatic injury in the chest, abdomen or pelvis. 2. 2.5 x 3.6 cm right lower lobe lung mass, suspicious for neoplasm. 3. Bilateral adrenal masses. There also is a 2.2 cm right suprarenal mass more posteriorly   which may represent an additional adrenal mass. 4. Lytic lesions of the left femoral neck and intertrochanteric region, worrisome for risk for pathologic fracture. Electronically Signed   By: Andreas Newport M.D.   On: 12/06/2015 19:46   Ct Femur Left Wo Contrast  12/06/2015  CLINICAL DATA:  55 year old male with a history of fracture left femur EXAM: CT OF THE LOWER LEFT EXTREMITY WITHOUT CONTRAST TECHNIQUE: Multidetector CT imaging of the was performed according to the standard protocol. COMPARISON:  Plain film of today's date. Contemporaneously CT of the abdomen/pelvis FINDINGS: Transverse fracture in the mid femur with approximately 26 degrees of lateral angulation at the fracture site. There is medial displacement of the distal fracture fragment by 1/2 shaft width. Soft tissue somewhat limited by the exclusion of IV contrast, however, there is abnormal soft tissue centered at the site of the fracture involving the type low ex base and expanding outside of the cortex circumferentially at the fracture site. Differentiating abnormal/pathologic soft tissue from hematoma is difficult. Permeative changes of the cortex at the fracture site with mild periosteal reaction and a wide zone of transition at the lesion. Lytic lesion at the greater trochanter, the femoral head neck junction, and the posterior femoral  neck. Partially imaged right hydrocele versus lesion. IMPRESSION: Pathologic fracture at the mid left femoral diaphysis, with approximately 26 degrees of angulation at the fracture site, and permeative bone changes of the cortex. There is abnormal soft tissue within the medullary space centered at the fracture site, with circumferential soft tissue about the femur, and a wide zone of transition. The soft tissue may either represent a primary osteosarcoma, or metastatic disease. Additional lytic lesions of the femur involves the femoral neck junction, posterior femoral neck, and the greater trochanter, compatible with metastatic disease. Partially imaged right-sided hydrocele versus scrotal lesion. Recommend correlation with physical exam. Signed, Dulcy Fanny. Earleen Newport, DO [facility] Electronically Signed   By: Corrie Mckusick D.O.   On: 12/06/2015 19:46   Dg Femur Min 2 Views Left  12/06/2015  CLINICAL DATA:  Patient twisted leg with mid femur pain. Initial encounter. EXAM: LEFT FEMUR 2 VIEWS COMPARISON:  None. FINDINGS: There is a transverse displaced fracture through the mid femoral diaphysis. There is an irregular lytic lesion involving the mid femoral diaphysis at the fracture site with overlying periosteal reaction. Overlying soft tissue swelling. IMPRESSION: Displaced transverse fracture through the mid femoral diaphysis. Irregular lytic lesion and periosteal reaction involving the mid femoral diaphysis at the fracture site is concerning for the possibility of metastatic disease or multiple myeloma. Electronically Signed   By: Lovey Newcomer M.D.   On: 12/06/2015 16:17    Other results: EKG: NSR. T wave inversion in V3, no prior tracing to compare.   Assessment & Plan by Problem: Active Problems:   Femur fracture (HCC)   Femur fracture, left ([place])  55 year old male with no significant past medical history presenting with closed left femur  fracture.  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 Serum calcium level is normal. X-ray of left femur showing a displaced transverse fracture to the mid femoral diaphysis, irregular lytic lesion and periosteal reaction involving the mid femoral diaphysis at the fracture site concerning for metastatic disease or multiple myeloma. CT of left femur showing a pathologic fracture at the mid left femoral diaphysis and  permeative bone changes of the cortex. There is abnormal soft tissue within the medullary space centered at the fracture site, with circumferential soft tissue about the femur, and a wide zone of transition. The soft tissue may either represent a primary osteosarcoma or metastatic disease. Additional lytic lesions of the femur involving the femoral neck junction, posterior femoral neck, and the greater trochanter, compatible with metastatic disease. CT of chest, abdomen, pelvis with contrast showing a 2.5 x 3.6 cm right lower lobe lung mass, suspicious for neoplasm. Also showing bilateral adrenal masses. There also is a 2.2 cm right suprarenal mass more posteriorly which may represent an additional adrenal mass. On exam it was noted patient had a big firm mass in the left posterior axillary line area lateral and inferior to the scapula which patient states he noticed several weeks ago.  -Orthopedic surgery has been consulted, awaiting recommendations -Dilaudid 1 mg q2 prn pain  -Oncology needs to consulted in the morning. Patient needs a biopsy of either the growth on his left posterior axillary area or of the left femur to help determine the type of malignancy.  -F/u multiple myeloma panel, kappa/ lambda light chains, 24 hr urine IFE, serum protein electrophoresis -F/u a.m. CBC -F/u a.m. BMP  Right swollen testicle  Right testicle  swollen on exam. CT of femur showing right-sided hydrocele versus scrotal lesion. PSA is 0.86. -F/u scrotal ultrasound   T wave inversion in V3 EKG showing NSR with T wave inversion in V3. Patient is symptomatic- no CP, dizziness, or dyspnea. -F/u a.m. EKG   Macrocytic anemia Hemoglobin 10.6 and MCV 101.3, no previous labs to compare. -Follow-up anemia panel in a.m.  Diet: NPO for now  DVT ppx: Lovenox  Code: Full (confirmed with patient)  Dispo: Disposition is deferred at this time, awaiting improvement of current medical problems. Anticipated discharge in approximately 2-3 day(s).   The patient does not have a current PCP (No Pcp Per Patient) and does need an [facility]PC hospital follow-up appointment after discharge.  The patient does have transportation limitations that hinder transportation to clinic appointments.  Signed: Jaylena Holloway, MD 12/06/2015, 8:52 PM   

## 2015-12-06 NOTE — ED Notes (Signed)
ORtho will apply bucks traction when patient is admitted upstairs.

## 2015-12-07 ENCOUNTER — Inpatient Hospital Stay (HOSPITAL_COMMUNITY): Payer: BLUE CROSS/BLUE SHIELD

## 2015-12-07 ENCOUNTER — Encounter (HOSPITAL_COMMUNITY): Payer: Self-pay

## 2015-12-07 DIAGNOSIS — E279 Disorder of adrenal gland, unspecified: Secondary | ICD-10-CM

## 2015-12-07 DIAGNOSIS — N509 Disorder of male genital organs, unspecified: Secondary | ICD-10-CM

## 2015-12-07 DIAGNOSIS — D638 Anemia in other chronic diseases classified elsewhere: Secondary | ICD-10-CM

## 2015-12-07 DIAGNOSIS — R918 Other nonspecific abnormal finding of lung field: Secondary | ICD-10-CM

## 2015-12-07 DIAGNOSIS — M868X5 Other osteomyelitis, thigh: Secondary | ICD-10-CM

## 2015-12-07 DIAGNOSIS — R52 Pain, unspecified: Secondary | ICD-10-CM | POA: Insufficient documentation

## 2015-12-07 DIAGNOSIS — C801 Malignant (primary) neoplasm, unspecified: Secondary | ICD-10-CM

## 2015-12-07 DIAGNOSIS — C7951 Secondary malignant neoplasm of bone: Secondary | ICD-10-CM

## 2015-12-07 DIAGNOSIS — M79605 Pain in left leg: Secondary | ICD-10-CM

## 2015-12-07 DIAGNOSIS — M7989 Other specified soft tissue disorders: Secondary | ICD-10-CM | POA: Insufficient documentation

## 2015-12-07 DIAGNOSIS — E876 Hypokalemia: Secondary | ICD-10-CM

## 2015-12-07 DIAGNOSIS — T148XXA Other injury of unspecified body region, initial encounter: Secondary | ICD-10-CM | POA: Insufficient documentation

## 2015-12-07 LAB — BASIC METABOLIC PANEL
ANION GAP: 10 (ref 5–15)
BUN: 8 mg/dL (ref 6–20)
CALCIUM: 8.4 mg/dL — AB (ref 8.9–10.3)
CO2: 24 mmol/L (ref 22–32)
Chloride: 101 mmol/L (ref 101–111)
Creatinine, Ser: 0.58 mg/dL — ABNORMAL LOW (ref 0.61–1.24)
GLUCOSE: 93 mg/dL (ref 65–99)
POTASSIUM: 3.4 mmol/L — AB (ref 3.5–5.1)
Sodium: 135 mmol/L (ref 135–145)

## 2015-12-07 LAB — PSA, TOTAL AND FREE
PROSTATE SPECIFIC AG, SERUM: 0.8 ng/mL (ref 0.0–4.0)
PSA FREE PCT: 12.5 %
PSA FREE: 0.1 ng/mL

## 2015-12-07 LAB — RETICULOCYTES
RBC.: 3.45 MIL/uL — AB (ref 4.22–5.81)
RETIC COUNT ABSOLUTE: 48.3 10*3/uL (ref 19.0–186.0)
RETIC CT PCT: 1.4 % (ref 0.4–3.1)

## 2015-12-07 LAB — CBC
HEMATOCRIT: 34.7 % — AB (ref 39.0–52.0)
HEMOGLOBIN: 11.4 g/dL — AB (ref 13.0–17.0)
MCH: 33 pg (ref 26.0–34.0)
MCHC: 32.9 g/dL (ref 30.0–36.0)
MCV: 100.6 fL — ABNORMAL HIGH (ref 78.0–100.0)
Platelets: 332 10*3/uL (ref 150–400)
RBC: 3.45 MIL/uL — AB (ref 4.22–5.81)
RDW: 13.8 % (ref 11.5–15.5)
WBC: 5.6 10*3/uL (ref 4.0–10.5)

## 2015-12-07 LAB — IRON AND TIBC
Iron: 75 ug/dL (ref 45–182)
Saturation Ratios: 33 % (ref 17.9–39.5)
TIBC: 225 ug/dL — ABNORMAL LOW (ref 250–450)
UIBC: 150 ug/dL

## 2015-12-07 LAB — FERRITIN: Ferritin: 179 ng/mL (ref 24–336)

## 2015-12-07 LAB — VITAMIN B12: VITAMIN B 12: 889 pg/mL (ref 180–914)

## 2015-12-07 LAB — PROTIME-INR
INR: 1.08 (ref 0.00–1.49)
Prothrombin Time: 14.2 seconds (ref 11.6–15.2)

## 2015-12-07 LAB — FOLATE: FOLATE: 15.4 ng/mL (ref >5.9)

## 2015-12-07 MED ORDER — HYDROMORPHONE HCL 1 MG/ML IJ SOLN
INTRAMUSCULAR | Status: AC
  Administered 2015-12-07: 12:00:00
  Filled 2015-12-07: qty 1

## 2015-12-07 MED ORDER — HYDROMORPHONE HCL 1 MG/ML IJ SOLN
2.0000 mg | INTRAMUSCULAR | Status: DC | PRN
  Administered 2015-12-07 (×2): 2 mg via INTRAVENOUS
  Filled 2015-12-07 (×2): qty 2

## 2015-12-07 MED ORDER — LORAZEPAM 2 MG/ML IJ SOLN
0.5000 mg | Freq: Once | INTRAMUSCULAR | Status: AC
  Administered 2015-12-07: 0.5 mg via INTRAVENOUS
  Filled 2015-12-07: qty 1

## 2015-12-07 MED ORDER — DIPHENHYDRAMINE HCL 50 MG/ML IJ SOLN: 12.5000 mg | Freq: Four times a day (QID) | INTRAMUSCULAR | Status: DC | PRN

## 2015-12-07 MED ORDER — POTASSIUM CHLORIDE CRYS ER 20 MEQ PO TBCR
20.0000 meq | EXTENDED_RELEASE_TABLET | Freq: Once | ORAL | Status: AC
  Administered 2015-12-07: 20 meq via ORAL
  Filled 2015-12-07: qty 1

## 2015-12-07 MED ORDER — DIPHENHYDRAMINE HCL 12.5 MG/5ML PO ELIX: 12.5000 mg | ORAL_SOLUTION | Freq: Four times a day (QID) | ORAL | Status: DC | PRN

## 2015-12-07 MED ORDER — WHITE PETROLATUM GEL
Status: AC
  Administered 2015-12-07: 09:00:00
  Filled 2015-12-07: qty 1

## 2015-12-07 MED ORDER — SODIUM CHLORIDE 0.9% FLUSH: 9.0000 mL | INTRAVENOUS | Status: DC | PRN

## 2015-12-07 MED ORDER — KETOROLAC TROMETHAMINE 15 MG/ML IJ SOLN
15.0000 mg | Freq: Four times a day (QID) | INTRAMUSCULAR | Status: DC
  Administered 2015-12-07 – 2015-12-09 (×10): 15 mg via INTRAVENOUS
  Filled 2015-12-07 (×10): qty 1

## 2015-12-07 MED ORDER — GADOBENATE DIMEGLUMINE 529 MG/ML IV SOLN
15.0000 mL | Freq: Once | INTRAVENOUS | Status: AC | PRN
  Administered 2015-12-07: 15 mL via INTRAVENOUS

## 2015-12-07 MED ORDER — HYDROMORPHONE 1 MG/ML IV SOLN
INTRAVENOUS | Status: DC
  Administered 2015-12-07: 0.5 mg via INTRAVENOUS
  Administered 2015-12-07: 14:00:00 via INTRAVENOUS
  Filled 2015-12-07: qty 25

## 2015-12-07 MED ORDER — ONDANSETRON HCL 4 MG/2ML IJ SOLN: 4.0000 mg | Freq: Four times a day (QID) | INTRAMUSCULAR | Status: DC | PRN

## 2015-12-07 MED ORDER — HYDROMORPHONE HCL 1 MG/ML IJ SOLN: 2.0000 mg | INTRAMUSCULAR | Status: DC | PRN

## 2015-12-07 MED ORDER — NALOXONE HCL 0.4 MG/ML IJ SOLN: 0.4000 mg | INTRAMUSCULAR | Status: DC | PRN

## 2015-12-07 MED ORDER — HYDROMORPHONE HCL 1 MG/ML IJ SOLN: 1.0000 mg | Freq: Once | INTRAMUSCULAR | Status: AC

## 2015-12-07 NOTE — Progress Notes (Signed)
Patient ID: Marvin Hardy, male   DOB: [birth year], 55 y.o.   MRN: [id number] Mr. Walls is more comfortable in buck's traction on his left lower extremity.  He seems to understand what is going on and that a metastatic workup is on going.  Surgery needs to be delayed until a more definitive diagnosis is made.  I would recommend a CT guided biopsy of at least his left femur lesion so the appropriate surgical management to stabilize the femur can be made.  He will need a surgical construct that can also address his left proximal femur lesions as well.  If the femur lesion is metastatic disease from another source (lungs, etc), then will plan on surgery Tuesday.  If this is an osteosarcoma, then a different surgical plan will be needed.  Don't know if biopsy can be obtained today and if Oncologists can help guide what other areas to biopsy.  I can be reached at [phone].  Will follow.

## 2015-12-07 NOTE — Progress Notes (Signed)
Subjective: Marvin Hardy is a 55 yo previously healthy man who presented to the ED yesterday with a pathologic fracture to his left femur. Overnight he has had no acute events and his pain was moderately controlled. He was told this morning about his probably cancer diagnosis to which he responded appropriately with sadness and some fear. Marvin Hardy has a family history of cancer and is anxious to find out the definitive diagnosis and treatment plan.   He denied any SOB or chest pain.  Objective: Vital signs in last 24 hours: Filed Vitals:   12/06/15 1745 12/06/15 1830 12/06/15 2020 12/07/15 0512  BP: 135/81 126/84 135/79 117/69  Pulse: 73 67 62 61  Temp:   98.6 F (37 C) 98.1 F (36.7 C)  TempSrc:   Oral Oral  Resp: _0 Height:   _1  (1.981 m)   Weight:   73.211 kg (161 lb 6.4 oz)   SpO2: 100% 93% 100% 98%   Weight change:   Intake/Output Summary (Last 24 hours) at 12/07/15 1031 Last data filed at 12/07/15 0126  Gross per 24 hour  Intake      0 ml  Output    600 ml  Net   -600 ml   Physical exam: BP 117/69 mmHg  Pulse 61  Temp(Src) 98.1 F (36.7 C) (Oral)  Resp 16  Ht _2  (1.981 m)  Wt 73.211 kg (161 lb 6.4 oz)  BMI 18.66 kg/m2  SpO2 98% General appearance: alert, cooperative and mild distress Lungs: clear to auscultation bilaterally Chest wall: no tenderness, hard, nontender, fixated mass on the left chest wall Heart: regular rate and rhythm, S1, S2 normal, no murmur, click, rub or gallop Abdomen: soft, non-tender; bowel sounds normal; no masses,  no organomegaly Male genitalia: abnormal findings: hydrocele right  Lab Results: CBC    Component Value Date/Time   WBC 5.6 12/07/2015 0319   RBC 3.45* 12/07/2015 0319   RBC 3.45* 12/07/2015 0319   HGB 11.4* 12/07/2015 0319   HCT 34.7* 12/07/2015 0319   PLT 332 12/07/2015 0319   MCV 100.6* 12/07/2015 0319   MCH 33.0 12/07/2015 0319   MCHC 32.9 12/07/2015 0319   RDW 13.8 12/07/2015 0319   BMP  Latest Ref Rng 12/07/2015 12/06/2015  Glucose 65 - 99 mg/dL 93 92  BUN 6 - 20 mg/dL 8 13  Creatinine 0.61 - 1.24 mg/dL 0.58(L) 0.64  Sodium 135 - 145 mmol/L 135 138  Potassium 3.5 - 5.1 mmol/L 3.4(L) 3.6  Chloride 101 - 111 mmol/L 101 105  CO2 22 - 32 mmol/L 24 23  Calcium 8.9 - 10.3 mg/dL 8.4(L) 8.5(L)     Micro Results: No results found for this or any previous visit (from the past 240 hour(s)). Studies/Results: Dg Chest 1 View  12/06/2015  CLINICAL DATA:  Left femur fracture. Preoperative respiratory examination. EXAM: CHEST 1 VIEW COMPARISON:  None. FINDINGS: The cardiomediastinal silhouette is unremarkable. Pulmonary vascular congestion is noted. There is no evidence of focal airspace disease, pulmonary edema, suspicious pulmonary nodule/mass, pleural effusion, or pneumothorax. No acute bony abnormalities are identified. IMPRESSION: Pulmonary vascular congestion. Electronically Signed   By: Margarette Canada M.D.   On: 12/06/2015 16:35   Ct Chest W Contrast  12/06/2015  CLINICAL DATA:  Multiple myeloma.  Fell today. EXAM: CT CHEST, ABDOMEN, AND PELVIS WITH CONTRAST TECHNIQUE: Multidetector CT imaging of the chest, abdomen and pelvis was performed following the standard protocol during bolus administration of intravenous contrast. CONTRAST:  137m ISOVUE-300 IOPAMIDOL (ISOVUE-300) INJECTION 61% COMPARISON:  None. FINDINGS: CT CHEST No pneumothorax. No effusions. No acute fracture. No intrathoracic vascular injury. No mediastinal or hilar adenopathy. Axillary regions are unremarkable. There is a 2.5 x 3.6 cm mass in the posterior basal segment of the right lower lobe, suspicious for neoplasm. Extensive centrilobular emphysematous changes are present in the upper lobes. CT ABDOMEN AND PELVIS No acute traumatic findings. No peritoneal blood or free air. Liver, spleen, pancreas and kidneys are unremarkable and intact. Aorta and IVC are normal in caliber, intact. There are adrenal masses bilaterally, measuring  approximately 2 cm. There also is a 2.2 cm mass at the upper pole the right kidney which might represent a far posterior right adrenal nodule. The adrenals are poorly defined due to the marked paucity of surrounding fat. Bowel is unremarkable. Small volume ascites. No acute fracture is evident in the chest, abdomen or pelvis. There is a destructive bone lesion with associated soft tissue mass at the anterior aspect of the left femur greater trochanter, seen to best advantage on coronal images 76 series 304. This soft tissue component measures 1.9 x 2.5 cm. There also are lytic defects of the left femoral subcapital and intertrochanteric regions which are worrisome for risk of pathologic fracture. IMPRESSION: 1. No evidence of acute traumatic injury in the chest, abdomen or pelvis. 2. 2.5 x 3.6 cm right lower lobe lung mass, suspicious for neoplasm. 3. Bilateral adrenal masses. There also is a 2.2 cm right suprarenal mass more posteriorly which may represent an additional adrenal mass. 4. Lytic lesions of the left femoral neck and intertrochanteric region, worrisome for risk for pathologic fracture. Electronically Signed   By: DAndreas NewportM.D.   On: 12/06/2015 19:46   UKoreaScrotum  12/07/2015  CLINICAL DATA:  Right scrotal swelling for 2 months EXAM: ULTRASOUND OF SCROTUM TECHNIQUE: Complete ultrasound examination of the testicles, epididymis, and other scrotal structures was performed. COMPARISON:  None. FINDINGS: Right testicle Measurements: 3.5 x 3.1 x 2.7 cm. There is a 3 x 3 x 2 mm cyst in the right testis. No noncystic mass or microlithiasis visualized. Color flow is seen in the right testis. Left testicle Measurements: 4.3 x 3.5 x 2.8 cm. There is a 3 x 3 x 2 mm cyst in the left testis. No noncystic mass or microlithiasis visualized. Color flow is seen in the left testis. Right epididymis:  Normal in size and appearance. Left epididymis: No inflammatory focus. There are multiple cysts arising from the  epididymal head, largest measuring 1 x 1 x 0.8 cm. Hydrocele: There is a large hydrocele on the right. There is a much smaller hydrocele on the left. Varicocele:  None visualized. No scrotal wall thickening or scrotal abscess. IMPRESSION: Sizable right hydrocele of uncertain etiology. Small left hydrocele. There are several epididymal head cysts/spermatoceles on the left. There is no epididymal inflammation on either side. There is a tiny cyst in each testis. Etiology uncertain. No noncystic testicular mass is identified. Given these small intratesticular cysts, a followup study in 3-4 months to assess for stability would be reasonable. Color flow is appreciated in each testis. Electronically Signed   By: WLowella GripIII M.D.   On: 12/07/2015 08:53   Ct Abdomen Pelvis W Contrast  12/06/2015  CLINICAL DATA:  Multiple myeloma.  Fell today. EXAM: CT CHEST, ABDOMEN, AND PELVIS WITH CONTRAST TECHNIQUE: Multidetector CT imaging of the chest, abdomen and pelvis was performed following the standard protocol during bolus administration of intravenous  contrast. CONTRAST:  114m ISOVUE-300 IOPAMIDOL (ISOVUE-300) INJECTION 61% COMPARISON:  None. FINDINGS: CT CHEST No pneumothorax. No effusions. No acute fracture. No intrathoracic vascular injury. No mediastinal or hilar adenopathy. Axillary regions are unremarkable. There is a 2.5 x 3.6 cm mass in the posterior basal segment of the right lower lobe, suspicious for neoplasm. Extensive centrilobular emphysematous changes are present in the upper lobes. CT ABDOMEN AND PELVIS No acute traumatic findings. No peritoneal blood or free air. Liver, spleen, pancreas and kidneys are unremarkable and intact. Aorta and IVC are normal in caliber, intact. There are adrenal masses bilaterally, measuring approximately 2 cm. There also is a 2.2 cm mass at the upper pole the right kidney which might represent a far posterior right adrenal nodule. The adrenals are poorly defined due to the  marked paucity of surrounding fat. Bowel is unremarkable. Small volume ascites. No acute fracture is evident in the chest, abdomen or pelvis. There is a destructive bone lesion with associated soft tissue mass at the anterior aspect of the left femur greater trochanter, seen to best advantage on coronal images 76 series 304. This soft tissue component measures 1.9 x 2.5 cm. There also are lytic defects of the left femoral subcapital and intertrochanteric regions which are worrisome for risk of pathologic fracture. IMPRESSION: 1. No evidence of acute traumatic injury in the chest, abdomen or pelvis. 2. 2.5 x 3.6 cm right lower lobe lung mass, suspicious for neoplasm. 3. Bilateral adrenal masses. There also is a 2.2 cm right suprarenal mass more posteriorly which may represent an additional adrenal mass. 4. Lytic lesions of the left femoral neck and intertrochanteric region, worrisome for risk for pathologic fracture. Electronically Signed   By: DAndreas NewportM.D.   On: 12/06/2015 19:46   Ct Femur Left Wo Contrast  12/06/2015  CLINICAL DATA:  55year old male with a history of fracture left femur EXAM: CT OF THE LOWER LEFT EXTREMITY WITHOUT CONTRAST TECHNIQUE: Multidetector CT imaging of the was performed according to the standard protocol. COMPARISON:  Plain film of today's date. Contemporaneously CT of the abdomen/pelvis FINDINGS: Transverse fracture in the mid femur with approximately 26 degrees of lateral angulation at the fracture site. There is medial displacement of the distal fracture fragment by 1/2 shaft width. Soft tissue somewhat limited by the exclusion of IV contrast, however, there is abnormal soft tissue centered at the site of the fracture involving the type low ex base and expanding outside of the cortex circumferentially at the fracture site. Differentiating abnormal/pathologic soft tissue from hematoma is difficult. Permeative changes of the cortex at the fracture site with mild periosteal  reaction and a wide zone of transition at the lesion. Lytic lesion at the greater trochanter, the femoral head neck junction, and the posterior femoral neck. Partially imaged right hydrocele versus lesion. IMPRESSION: Pathologic fracture at the mid left femoral diaphysis, with approximately 26 degrees of angulation at the fracture site, and permeative bone changes of the cortex. There is abnormal soft tissue within the medullary space centered at the fracture site, with circumferential soft tissue about the femur, and a wide zone of transition. The soft tissue may either represent a primary osteosarcoma, or metastatic disease. Additional lytic lesions of the femur involves the femoral neck junction, posterior femoral neck, and the greater trochanter, compatible with metastatic disease. Partially imaged right-sided hydrocele versus scrotal lesion. Recommend correlation with physical exam. Signed, JDulcy Fanny WEarleen Newport DO [facility] Electronically Signed   By: JCorrie Mckusick  D.O.   On: 12/06/2015 19:46   Dg Femur Min 2 Views Left  12/06/2015  CLINICAL DATA:  Patient twisted leg with mid femur pain. Initial encounter. EXAM: LEFT FEMUR 2 VIEWS COMPARISON:  None. FINDINGS: There is a transverse displaced fracture through the mid femoral diaphysis. There is an irregular lytic lesion involving the mid femoral diaphysis at the fracture site with overlying periosteal reaction. Overlying soft tissue swelling. IMPRESSION: Displaced transverse fracture through the mid femoral diaphysis. Irregular lytic lesion and periosteal reaction involving the mid femoral diaphysis at the fracture site is concerning for the possibility of metastatic disease or multiple myeloma. Electronically Signed   By: Lovey Newcomer M.D.   On: 12/06/2015 16:17   Medications: I have reviewed the patient's current medications. Scheduled Meds: Scheduled Meds: . enoxaparin (LOVENOX) injection  40 mg  Subcutaneous Q24H  . HYDROmorphone   Intravenous Q4H  . ketorolac  15 mg Intravenous Q6H  . potassium chloride  20 mEq Oral Once   Current facility-administered medications:  .  diphenhydrAMINE (BENADRYL) injection 12.5 mg, 12.5 mg, Intravenous, Q6H PRN **OR** diphenhydrAMINE (BENADRYL) 12.5 MG/5ML elixir 12.5 mg, 12.5 mg, Oral, Q6H PRN, Milagros Loll, MD .  enoxaparin (LOVENOX) injection 40 mg, 40 mg, Subcutaneous, Q24H, Shela Leff, MD, 40 mg at 12/06/15 2143 .  HYDROmorphone (DILAUDID) 1 mg/mL PCA injection, , Intravenous, Q4H, Milagros Loll, MD .  ketorolac (TORADOL) 15 MG/ML injection 15 mg, 15 mg, Intravenous, Q6H, Corky Sox, MD, 15 mg at 12/07/15 0615 .  naloxone [facility]) injection 0.4 mg, 0.4 mg, Intravenous, PRN **AND** sodium chloride flush (NS) 0.9 % injection 9 mL, 9 mL, Intravenous, PRN, Milagros Loll, MD .  ondansetron [facility]) injection 4 mg, 4 mg, Intravenous, Q6H PRN, Milagros Loll, MD .  potassium chloride SA (K-DUR,KLOR-CON) CR tablet 20 mEq, 20 mEq, Oral, Once, Burgess Estelle, MD .  promethazine (PHENERGAN) tablet 25 mg, 25 mg, Oral, Q6H PRN, Corky Sox, MD  Assessment/Plan:  Mr. Revard is a 55 yo previously healthy man who presented to the ED with a pathological fracture of his left femur who was found to have multiple tumors involving the left femur, bilateral adrenals and left axillary chest wall.   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 Xray showed transverse displacement to the mid femoral diaphysis with irregular lytic lesions and periosteal reaction involving the mid femoral diaphysis at the fracture site. This is concerning for either a primary bone cancer or metastatic disease. CT of the femur showed a pathologic fracture at the mid femoral diaphysis with permeative bone changes in the cortex.  Additionally, there was abnormal soft tissue within the medullary space that extended into the fracture site. This is concerning for an osteosarcoma or metastatic disease. CT of chest, abdomen and pelvis revealed a right lower lung mass, possibly tumor. There were also bilateral adrenal masses found. There was an additional suprarenal mass found. On exam, a large, firm, nontender, fixated mass was found on the left chest wall near the axillary line that has been present for about 3 months.   -speak to IR about CT guided biopsy  -contact oncology  -pain management: PCA dilaudid, toradol 2m q6 IV   Right swollen testicle  Right testicle swollen on exam. CT of femur showing right-sided hydrocele versus scrotal lesion. PSA is 0.86. -scrotal ultrasound showed hydrocele  T wave inversion in V3 EKG showing NSR with T wave inversion in V3. Patient is symptomatic- no CP, dizziness, or dyspnea. -F/u a.m. EKG   Macrocytic anemia H+H 11.4/34.7 and MCV 100.6, no previous labs to compare.  This is a Careers information officer Note.  The care of the patient was discussed with Dr. Tiburcio Pea and the assessment and plan formulated with their assistance.  Please see their attached note for official documentation of the daily encounter.   LOS: 1 day   Wandra Mannan, Med Student 12/07/2015, 10:31 AM

## 2015-12-07 NOTE — Progress Notes (Signed)
Orthopedic Tech Progress Note Patient Details:  Marvin Hardy 1960/08/08 [id number]  Musculoskeletal Traction Type of Traction: Bucks Skin Traction Traction Location: lle Traction Weight: 10 lbs    Karolee Stamps 12/07/2015, 6:04 AM

## 2015-12-07 NOTE — H&P (Signed)
Date: 12/06/2015               Patient Name:  Marvin Hardy MRN: [id number]  DOB: 1961/06/30 Age / Sex: 55 y.o., male   PCP: No Pcp Per Patient         Medical Service: Internal Medicine Teaching Service         Attending Physician: Dr. Aldine Contes, MD    First Contact: Dr. Tiburcio Pea  Pager: [phone]  Second Contact: Dr. Randell Patient  Pager: [phone]       After Hours (After 5p/  First Contact Pager: [phone]  weekends / holidays): Second Contact Pager: [phone]   Chief Complaint: pain in LLE  History of Present Illness: Pt is a 55 yo M with no significant past medical history presenting to the hospital with pain in his left lower extremity. Patient states he was at work and twisted his left leg while trying to turn while walking down a step. States he heard his "bone pop" when this happened. Denies having any history of fractures in the past. Reports having a torn meniscus in his left knee for the past 2-3 months and is supposed to be getting surgery done sometime later this month. He denies having any recent fevers, change in appetite, cough, or hemoptysis. Reports having night sweats for the past few weeks and 7 pound weight loss (it is not clear from the history since when patient has been losing weight). Also reports having swelling in his testicle for the past 1 month. He also noticed a swelling on his left chest area a few weeks ago.   Meds: Current Facility-Administered Medications  Medication Dose Route Frequency Provider Last Rate Last Dose  . diatrizoate meglumine-sodium (GASTROGRAFIN) 66-10 % solution           . enoxaparin (LOVENOX) injection 40 mg  40 mg Subcutaneous Q24H Shela Leff, MD      . HYDROmorphone (DILAUDID) injection 1 mg  1 mg Intravenous Q2H PRN Shela Leff, MD        Allergies: Allergies as of 12/06/2015 - Review Complete 12/06/2015  Allergen Reaction Noted  . Aleve [naproxen sodium] Itching 09/04/2015   History reviewed. No pertinent past medical  history. No past surgical history on file. Family History  Problem Relation Age of Onset  . Hypertension Mother   . Heart disease Mother   . Diabetes Mother   . Lung cancer Father    Social History   Social History  . Marital Status: Single    Spouse Name: N/A  . Number of Children: N/A  . Years of Education: N/A   Occupational History  . Not on file.   Social History Main Topics  . Smoking status: Current Every Day Smoker    Types: Cigars  . Smokeless tobacco: Not on file     Comment: Smoking 1 cigar per day for the past 25 yrs  . Alcohol Use: 0.0 oz/week    0 Standard drinks or equivalent per week     Comment: Beer on weekends (patient is not sure how much)  . Drug Use: No  . Sexual Activity: Not on file   Other Topics Concern  . Not on file   Social History Narrative    Review of Systems: Review of Systems  Constitutional: Positive for weight loss. Negative for fever, chills and malaise/fatigue.       Night sweats  HENT: Negative for congestion and sore throat.   Eyes: Negative for blurred vision and pain.  Respiratory: Negative for cough, hemoptysis, sputum production, shortness of breath and wheezing.   Cardiovascular: Negative for chest pain, palpitations and leg swelling.  Gastrointestinal: Negative for nausea, vomiting, abdominal pain, blood in stool and melena.  Genitourinary: Negative for dysuria, hematuria and flank pain.  Musculoskeletal: Positive for joint pain. Negative for myalgias.       Pain in left knee and left femur  Skin: Negative for itching and rash.  Neurological: Negative for dizziness, focal weakness and headaches.    Physical Exam: Blood pressure 135/79, pulse 62, temperature 98.6 F (37 C), temperature source Oral, resp. rate 16, height _0  (1.981 m), weight 161 lb 6.4 oz (73.211 kg), SpO2 100 %. Physical Exam  Constitutional: He is oriented to person, place, and time. He appears well-developed and well-nourished. No distress.    HENT:  Head: Normocephalic and atraumatic.  Mouth/Throat: Oropharynx is clear and moist.  Eyes: EOM are normal. Pupils are equal, round, and reactive to light.  Neck: Normal range of motion. Neck supple. No tracheal deviation present.  Axillary lymphadenopathy  Cardiovascular: Normal rate, regular rhythm and intact distal pulses.  Exam reveals no gallop and no friction rub.   No murmur heard. Pulmonary/Chest: Effort normal and breath sounds normal. No respiratory distress. He has no wheezes. He has no rales.  Anterior lung fields clear to auscultation.  Abdominal: Soft. Bowel sounds are normal. He exhibits no distension. There is no tenderness. There is no rebound and no guarding.  No organomegaly  Genitourinary:  Right testicle is enlarged Inguinal lymphadenopathy present  Musculoskeletal: He exhibits no edema.  Closed fracture of left femur 10 x 10 cm nontender firm area noted at the left posterior axillary line lateral and inferior to the left scapula  Lymphadenopathy:    He has no cervical adenopathy.  Neurological: He is alert and oriented to person, place, and time.  Patient is able to wiggle his toes on the left and has +2 dorsalis pedis pulse on the left. Sensation to light touch is grossly intact as well.   Skin: Skin is warm and dry. No rash noted. No erythema.  Several 1x1 cm nontender nodules on the sternum   Lab results: Basic Metabolic Panel:  Recent Labs  12/06/15 1620  NA 138  K 3.6  CL 105  CO2 23  GLUCOSE 92  BUN 13  CREATININE 0.64  CALCIUM 8.5*   Liver Function Tests:  Recent Labs  12/06/15 1909  AST 14*  ALT 8*  ALKPHOS 71  BILITOT 0.2*  PROT 6.1*  ALBUMIN 2.9*   CBC:  Recent Labs  12/06/15 1620  WBC 5.6  HGB 10.6*  HCT 32.3*  MCV 101.3*  PLT 314   Imaging results:  Dg Chest 1 View  12/06/2015  CLINICAL DATA:  Left femur fracture. Preoperative respiratory examination. EXAM: CHEST 1 VIEW COMPARISON:  None. FINDINGS: The  cardiomediastinal silhouette is unremarkable. Pulmonary vascular congestion is noted. There is no evidence of focal airspace disease, pulmonary edema, suspicious pulmonary nodule/mass, pleural effusion, or pneumothorax. No acute bony abnormalities are identified. IMPRESSION: Pulmonary vascular congestion. Electronically Signed   By: Margarette Canada M.D.   On: 12/06/2015 16:35   Ct Chest W Contrast  12/06/2015  CLINICAL DATA:  Multiple myeloma.  Fell today. EXAM: CT CHEST, ABDOMEN, AND PELVIS WITH CONTRAST TECHNIQUE: Multidetector CT imaging of the chest, abdomen and pelvis was performed following the standard protocol during bolus administration of intravenous contrast. CONTRAST:  114m ISOVUE-300 IOPAMIDOL (ISOVUE-300) INJECTION 61% COMPARISON:  None. FINDINGS: CT CHEST No pneumothorax. No effusions. No acute fracture. No intrathoracic vascular injury. No mediastinal or hilar adenopathy. Axillary regions are unremarkable. There is a 2.5 x 3.6 cm mass in the posterior basal segment of the right lower lobe, suspicious for neoplasm. Extensive centrilobular emphysematous changes are present in the upper lobes. CT ABDOMEN AND PELVIS No acute traumatic findings. No peritoneal blood or free air. Liver, spleen, pancreas and kidneys are unremarkable and intact. Aorta and IVC are normal in caliber, intact. There are adrenal masses bilaterally, measuring approximately 2 cm. There also is a 2.2 cm mass at the upper pole the right kidney which might represent a far posterior right adrenal nodule. The adrenals are poorly defined due to the marked paucity of surrounding fat. Bowel is unremarkable. Small volume ascites. No acute fracture is evident in the chest, abdomen or pelvis. There is a destructive bone lesion with associated soft tissue mass at the anterior aspect of the left femur greater trochanter, seen to best advantage on coronal images 76 series 304. This soft tissue component measures 1.9 x 2.5 cm. There also are lytic  defects of the left femoral subcapital and intertrochanteric regions which are worrisome for risk of pathologic fracture. IMPRESSION: 1. No evidence of acute traumatic injury in the chest, abdomen or pelvis. 2. 2.5 x 3.6 cm right lower lobe lung mass, suspicious for neoplasm. 3. Bilateral adrenal masses. There also is a 2.2 cm right suprarenal mass more posteriorly which may represent an additional adrenal mass. 4. Lytic lesions of the left femoral neck and intertrochanteric region, worrisome for risk for pathologic fracture. Electronically Signed   By: Andreas Newport M.D.   On: 12/06/2015 19:46   Ct Abdomen Pelvis W Contrast  12/06/2015  CLINICAL DATA:  Multiple myeloma.  Fell today. EXAM: CT CHEST, ABDOMEN, AND PELVIS WITH CONTRAST TECHNIQUE: Multidetector CT imaging of the chest, abdomen and pelvis was performed following the standard protocol during bolus administration of intravenous contrast. CONTRAST:  160m ISOVUE-300 IOPAMIDOL (ISOVUE-300) INJECTION 61% COMPARISON:  None. FINDINGS: CT CHEST No pneumothorax. No effusions. No acute fracture. No intrathoracic vascular injury. No mediastinal or hilar adenopathy. Axillary regions are unremarkable. There is a 2.5 x 3.6 cm mass in the posterior basal segment of the right lower lobe, suspicious for neoplasm. Extensive centrilobular emphysematous changes are present in the upper lobes. CT ABDOMEN AND PELVIS No acute traumatic findings. No peritoneal blood or free air. Liver, spleen, pancreas and kidneys are unremarkable and intact. Aorta and IVC are normal in caliber, intact. There are adrenal masses bilaterally, measuring approximately 2 cm. There also is a 2.2 cm mass at the upper pole the right kidney which might represent a far posterior right adrenal nodule. The adrenals are poorly defined due to the marked paucity of surrounding fat. Bowel is unremarkable. Small volume ascites. No acute fracture is evident in the chest, abdomen or pelvis. There is a  destructive bone lesion with associated soft tissue mass at the anterior aspect of the left femur greater trochanter, seen to best advantage on coronal images 76 series 304. This soft tissue component measures 1.9 x 2.5 cm. There also are lytic defects of the left femoral subcapital and intertrochanteric regions which are worrisome for risk of pathologic fracture. IMPRESSION: 1. No evidence of acute traumatic injury in the chest, abdomen or pelvis. 2. 2.5 x 3.6 cm right lower lobe lung mass, suspicious for neoplasm. 3. Bilateral adrenal masses. There also is a 2.2 cm right suprarenal mass more posteriorly  which may represent an additional adrenal mass. 4. Lytic lesions of the left femoral neck and intertrochanteric region, worrisome for risk for pathologic fracture. Electronically Signed   By: Andreas Newport M.D.   On: 12/06/2015 19:46   Ct Femur Left Wo Contrast  12/06/2015  CLINICAL DATA:  55 year old male with a history of fracture left femur EXAM: CT OF THE LOWER LEFT EXTREMITY WITHOUT CONTRAST TECHNIQUE: Multidetector CT imaging of the was performed according to the standard protocol. COMPARISON:  Plain film of today's date. Contemporaneously CT of the abdomen/pelvis FINDINGS: Transverse fracture in the mid femur with approximately 26 degrees of lateral angulation at the fracture site. There is medial displacement of the distal fracture fragment by 1/2 shaft width. Soft tissue somewhat limited by the exclusion of IV contrast, however, there is abnormal soft tissue centered at the site of the fracture involving the type low ex base and expanding outside of the cortex circumferentially at the fracture site. Differentiating abnormal/pathologic soft tissue from hematoma is difficult. Permeative changes of the cortex at the fracture site with mild periosteal reaction and a wide zone of transition at the lesion. Lytic lesion at the greater trochanter, the femoral head neck junction, and the posterior femoral  neck. Partially imaged right hydrocele versus lesion. IMPRESSION: Pathologic fracture at the mid left femoral diaphysis, with approximately 26 degrees of angulation at the fracture site, and permeative bone changes of the cortex. There is abnormal soft tissue within the medullary space centered at the fracture site, with circumferential soft tissue about the femur, and a wide zone of transition. The soft tissue may either represent a primary osteosarcoma, or metastatic disease. Additional lytic lesions of the femur involves the femoral neck junction, posterior femoral neck, and the greater trochanter, compatible with metastatic disease. Partially imaged right-sided hydrocele versus scrotal lesion. Recommend correlation with physical exam. Signed, Dulcy Fanny. Earleen Newport, DO [facility] Electronically Signed   By: Corrie Mckusick D.O.   On: 12/06/2015 19:46   Dg Femur Min 2 Views Left  12/06/2015  CLINICAL DATA:  Patient twisted leg with mid femur pain. Initial encounter. EXAM: LEFT FEMUR 2 VIEWS COMPARISON:  None. FINDINGS: There is a transverse displaced fracture through the mid femoral diaphysis. There is an irregular lytic lesion involving the mid femoral diaphysis at the fracture site with overlying periosteal reaction. Overlying soft tissue swelling. IMPRESSION: Displaced transverse fracture through the mid femoral diaphysis. Irregular lytic lesion and periosteal reaction involving the mid femoral diaphysis at the fracture site is concerning for the possibility of metastatic disease or multiple myeloma. Electronically Signed   By: Lovey Newcomer M.D.   On: 12/06/2015 16:17    Other results: EKG: NSR. T wave inversion in V3, no prior tracing to compare.   Assessment & Plan by Problem: Active Problems:   Femur fracture (HCC)   Femur fracture, left ([place])  55 year old male with no significant past medical history presenting with closed left femur  fracture.  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 Serum calcium level is normal. X-ray of left femur showing a displaced transverse fracture to the mid femoral diaphysis, irregular lytic lesion and periosteal reaction involving the mid femoral diaphysis at the fracture site concerning for metastatic disease or multiple myeloma. CT of left femur showing a pathologic fracture at the mid left femoral diaphysis and  permeative bone changes of the cortex. There is abnormal soft tissue within the medullary space centered at the fracture site, with circumferential soft tissue about the femur, and a wide zone of transition. The soft tissue may either represent a primary osteosarcoma or metastatic disease. Additional lytic lesions of the femur involving the femoral neck junction, posterior femoral neck, and the greater trochanter, compatible with metastatic disease. CT of chest, abdomen, pelvis with contrast showing a 2.5 x 3.6 cm right lower lobe lung mass, suspicious for neoplasm. Also showing bilateral adrenal masses. There also is a 2.2 cm right suprarenal mass more posteriorly which may represent an additional adrenal mass. On exam it was noted patient had a big firm mass in the left posterior axillary line area lateral and inferior to the scapula which patient states he noticed several weeks ago.  -Orthopedic surgery has been consulted, awaiting recommendations -Dilaudid 1 mg q2 prn pain  -Oncology needs to consulted in the morning. Patient needs a biopsy of either the growth on his left posterior axillary area or of the left femur to help determine the type of malignancy.  -F/u multiple myeloma panel, kappa/ lambda light chains, 24 hr urine IFE, serum protein electrophoresis -F/u a.m. CBC -F/u a.m. BMP  Right swollen testicle  Right testicle  swollen on exam. CT of femur showing right-sided hydrocele versus scrotal lesion. PSA is 0.86. -F/u scrotal ultrasound   T wave inversion in V3 EKG showing NSR with T wave inversion in V3. Patient is symptomatic- no CP, dizziness, or dyspnea. -F/u a.m. EKG   Macrocytic anemia Hemoglobin 10.6 and MCV 101.3, no previous labs to compare. -Follow-up anemia panel in a.m.  Diet: NPO for now  DVT ppx: Lovenox  Code: Full (confirmed with patient)  Dispo: Disposition is deferred at this time, awaiting improvement of current medical problems. Anticipated discharge in approximately 2-3 day(s).   The patient does not have a current PCP (No Pcp Per Patient) and does need an [facility] hospital follow-up appointment after discharge.  The patient does have transportation limitations that hinder transportation to clinic appointments.  Signed: Shela Leff, MD 12/06/2015, 8:52 PM

## 2015-12-07 NOTE — Progress Notes (Signed)
[place] [phone]   [phone]  INPATIENT CONSULT NOTE  REFERRING PHYSICIAN: Dr. Burgess Estelle, [facility].  REASON FOR CONSULTATION:  55 years old African-American male with questionable metastatic cancer  HPI Marvin Hardy is a 55 y.o. male with no significant past medical history but long history of smoking. The patient was admitted to [name] on 12/06/2015 complaining of pain in the left lower extremity. He twisted his leg at work and heard a bone pop. X-ray of the left femur showed displaced transverse fracture throughout the mid femoral diaphysis was irregular lytic lesion and periosteal reaction involving the mid femoral diaphysis is at the fracture site concerning for possibility of metastatic disease or multiple myeloma. CT scan of the chest, abdomen and pelvis was performed on 12/06/2015 and it showed a 2.5 x 3.6 cm mass in the posterior basal segment of the right lower lobe suspicious for neoplasm. There was no mediastinal or hilar adenopathy. There was also bilateral adrenal masses. There was also 2.2 cm right suprarenal mass more posteriorly may represent an additional adrenal mass. There was lytic lesions of the left femoral neck and intertrochanteric region worrisome for risk of pathologic fracture. CT of the left femur showed pathologic fracture at the mid left femoral diathesis was approximately 26 of angulation at the fracture site and primitive bony changes of the cortex. There was abnormal soft tissue within the medullary space centered at the fracture site with circumferential soft tissue about the femur and a wide zone of transition. The soft tissue maser presented primary osteosarcoma or metastatic disease. There was additional lytic lesions of the femur involving the femoral neck junction, posterior femoral neck and the greater trochanter compatible with metastatic disease. The scan also showed  partially imaged right-sided hydrocele versus scrotal lesion. The patient underwent ultrasound of the scrotum on 12/07/2015 and it showed sizable right hydrocele of certain etiology. There was a tiny cyst in each testis was unclear etiology and no noncystic testicular mass identified. Dr. Tiburcio Pea kindly ask me to see the patient today for further evaluation and recommendation regarding his condition. The patient also mentions that he has a mass lesion on the left side of the back of the chest that has been going on for around 6 weeks and it was getting bigger. He was seen by orthopedic surgery and scheduled for surgical intervention early next week. He denied having any significant complaints today except for the pain in the left hip area. He denied having any significant fever or chills. He has no chest pain, shortness breath, cough or hemoptysis. The patient denied having any nausea or vomiting. He lost few pounds over the last few weeks. He has no headache or visual changes. Family history significant for a father with lung cancer. The patient is married and has 2 children. He works as a Curator. He has a history of smoking cigar for 35 years. He has no history of alcohol or drug abuse.   HPI  History reviewed. No pertinent past medical history.  No past surgical history on file.  Family History  Problem Relation Age of Onset  . Hypertension Mother   . Heart disease Mother   . Diabetes Mother   . Lung cancer Father     Social History Social History  Substance Use Topics  . Smoking status: Current Every Day Smoker    Types: Cigars  . Smokeless tobacco: None     Comment: Smoking 1 cigar  per day for the past 25 yrs  . Alcohol Use: 0.0 oz/week    0 Standard drinks or equivalent per week     Comment: Beer on weekends (patient is not sure how much)    Allergies  Allergen Reactions  . Aleve [Naproxen Sodium] Itching         Current Facility-Administered Medications  Medication  Dose Route Frequency Provider Last Rate Last Dose  . diphenhydrAMINE (BENADRYL) injection 12.5 mg  12.5 mg Intravenous Q6H PRN Milagros Loll, MD       Or  . diphenhydrAMINE (BENADRYL) 12.5 MG/5ML elixir 12.5 mg  12.5 mg Oral Q6H PRN Milagros Loll, MD      . enoxaparin (LOVENOX) injection 40 mg  40 mg Subcutaneous Q24H Shela Leff, MD   40 mg at 12/06/15 2143  . HYDROmorphone (DILAUDID) 1 mg/mL PCA injection   Intravenous Q4H Milagros Loll, MD      . HYDROmorphone (DILAUDID) injection 1 mg  1 mg Intravenous Once Burgess Estelle, MD      . ketorolac (TORADOL) 15 MG/ML injection 15 mg  15 mg Intravenous Q6H Corky Sox, MD   15 mg at 12/07/15 0615  . naloxone [facility]) injection 0.4 mg  0.4 mg Intravenous PRN Milagros Loll, MD       And  . sodium chloride flush (NS) 0.9 % injection 9 mL  9 mL Intravenous PRN Milagros Loll, MD      . ondansetron [facility]) injection 4 mg  4 mg Intravenous Q6H PRN Milagros Loll, MD      . potassium chloride SA (K-DUR,KLOR-CON) CR tablet 20 mEq  20 mEq Oral Once Burgess Estelle, MD      . promethazine (PHENERGAN) tablet 25 mg  25 mg Oral Q6H PRN Corky Sox, MD        Review of Systems  Constitutional: positive for fatigue and weight loss Eyes: negative Ears, nose, mouth, throat, and face: negative Respiratory: negative Cardiovascular: negative Gastrointestinal: negative Genitourinary:negative Integument/breast: negative Hematologic/lymphatic: negative Musculoskeletal:positive for bone pain Neurological: negative Behavioral/Psych: negative Endocrine: negative Allergic/Immunologic: negative  Physical Exam  LEX:NTZGY, healthy, no distress, well nourished and well developed SKIN: skin color, texture, turgor are normal, no rashes or significant lesions HEAD: Normocephalic, No masses, lesions, tenderness or abnormalities EYES: normal, PERRLA, Conjunctiva are pink and non-injected EARS: External ears normal, Canals clear OROPHARYNX:no  exudate, no erythema and lips, buccal mucosa, and tongue normal  NECK: supple, no adenopathy, no JVD LYMPH:  no palpable lymphadenopathy, no hepatosplenomegaly LUNGS: clear to auscultation , and palpation HEART: regular rate & rhythm, no murmurs and no gallops ABDOMEN:abdomen soft, non-tender, normal bowel sounds and no masses or organomegaly BACK: Back symmetric, no curvature., No CVA tenderness EXTREMITIES:no joint deformities, effusion, or inflammation, no edema, no skin discoloration  NEURO: alert & oriented x 3 with fluent speech, no focal motor/sensory deficits  PERFORMANCE STATUS: ECOG 1  LABORATORY DATA: Lab Results  Component Value Date   WBC 5.6 12/07/2015   HGB 11.4* 12/07/2015   HCT 34.7* 12/07/2015   MCV 100.6* 12/07/2015   PLT 332 12/07/2015    @LASTCHEM @  RADIOGRAPHIC STUDIES: Dg Chest 1 View  12/06/2015  CLINICAL DATA:  Left femur fracture. Preoperative respiratory examination. EXAM: CHEST 1 VIEW COMPARISON:  None. FINDINGS: The cardiomediastinal silhouette is unremarkable. Pulmonary vascular congestion is noted. There is no evidence of focal airspace disease, pulmonary edema, suspicious pulmonary nodule/mass, pleural effusion, or pneumothorax. No acute bony abnormalities are identified. IMPRESSION:  Pulmonary vascular congestion. Electronically Signed   By: Margarette Canada M.D.   On: 12/06/2015 16:35   Ct Chest W Contrast  12/06/2015  CLINICAL DATA:  Multiple myeloma.  Fell today. EXAM: CT CHEST, ABDOMEN, AND PELVIS WITH CONTRAST TECHNIQUE: Multidetector CT imaging of the chest, abdomen and pelvis was performed following the standard protocol during bolus administration of intravenous contrast. CONTRAST:  180m ISOVUE-300 IOPAMIDOL (ISOVUE-300) INJECTION 61% COMPARISON:  None. FINDINGS: CT CHEST No pneumothorax. No effusions. No acute fracture. No intrathoracic vascular injury. No mediastinal or hilar adenopathy. Axillary regions are unremarkable. There is a 2.5 x 3.6 cm mass  in the posterior basal segment of the right lower lobe, suspicious for neoplasm. Extensive centrilobular emphysematous changes are present in the upper lobes. CT ABDOMEN AND PELVIS No acute traumatic findings. No peritoneal blood or free air. Liver, spleen, pancreas and kidneys are unremarkable and intact. Aorta and IVC are normal in caliber, intact. There are adrenal masses bilaterally, measuring approximately 2 cm. There also is a 2.2 cm mass at the upper pole the right kidney which might represent a far posterior right adrenal nodule. The adrenals are poorly defined due to the marked paucity of surrounding fat. Bowel is unremarkable. Small volume ascites. No acute fracture is evident in the chest, abdomen or pelvis. There is a destructive bone lesion with associated soft tissue mass at the anterior aspect of the left femur greater trochanter, seen to best advantage on coronal images 76 series 304. This soft tissue component measures 1.9 x 2.5 cm. There also are lytic defects of the left femoral subcapital and intertrochanteric regions which are worrisome for risk of pathologic fracture. IMPRESSION: 1. No evidence of acute traumatic injury in the chest, abdomen or pelvis. 2. 2.5 x 3.6 cm right lower lobe lung mass, suspicious for neoplasm. 3. Bilateral adrenal masses. There also is a 2.2 cm right suprarenal mass more posteriorly which may represent an additional adrenal mass. 4. Lytic lesions of the left femoral neck and intertrochanteric region, worrisome for risk for pathologic fracture. Electronically Signed   By: DAndreas NewportM.D.   On: 12/06/2015 19:46   [place]rotum  12/07/2015  CLINICAL DATA:  Right scrotal swelling for 2 months EXAM: ULTRASOUND OF SCROTUM TECHNIQUE: Complete ultrasound examination of the testicles, epididymis, and other scrotal structures was performed. COMPARISON:  None. FINDINGS: Right testicle Measurements: 3.5 x 3.1 x 2.7 cm. There is a 3 x 3 x 2 mm cyst in the right testis. No  noncystic mass or microlithiasis visualized. Color flow is seen in the right testis. Left testicle Measurements: 4.3 x 3.5 x 2.8 cm. There is a 3 x 3 x 2 mm cyst in the left testis. No noncystic mass or microlithiasis visualized. Color flow is seen in the left testis. Right epididymis:  Normal in size and appearance. Left epididymis: No inflammatory focus. There are multiple cysts arising from the epididymal head, largest measuring 1 x 1 x 0.8 cm. Hydrocele: There is a large hydrocele on the right. There is a much smaller hydrocele on the left. Varicocele:  None visualized. No scrotal wall thickening or scrotal abscess. IMPRESSION: Sizable right hydrocele of uncertain etiology. Small left hydrocele. There are several epididymal head cysts/spermatoceles on the left. There is no epididymal inflammation on either side. There is a tiny cyst in each testis. Etiology uncertain. No noncystic testicular mass is identified. Given these small intratesticular cysts, a followup study in 3-4 months to assess for stability would be reasonable. Color flow is  appreciated in each testis. Electronically Signed   By: Lowella Grip III M.D.   On: 12/07/2015 08:53   Ct Abdomen Pelvis W Contrast  12/06/2015  CLINICAL DATA:  Multiple myeloma.  Fell today. EXAM: CT CHEST, ABDOMEN, AND PELVIS WITH CONTRAST TECHNIQUE: Multidetector CT imaging of the chest, abdomen and pelvis was performed following the standard protocol during bolus administration of intravenous contrast. CONTRAST:  142m ISOVUE-300 IOPAMIDOL (ISOVUE-300) INJECTION 61% COMPARISON:  None. FINDINGS: CT CHEST No pneumothorax. No effusions. No acute fracture. No intrathoracic vascular injury. No mediastinal or hilar adenopathy. Axillary regions are unremarkable. There is a 2.5 x 3.6 cm mass in the posterior basal segment of the right lower lobe, suspicious for neoplasm. Extensive centrilobular emphysematous changes are present in the upper lobes. CT ABDOMEN AND PELVIS No  acute traumatic findings. No peritoneal blood or free air. Liver, spleen, pancreas and kidneys are unremarkable and intact. Aorta and IVC are normal in caliber, intact. There are adrenal masses bilaterally, measuring approximately 2 cm. There also is a 2.2 cm mass at the upper pole the right kidney which might represent a far posterior right adrenal nodule. The adrenals are poorly defined due to the marked paucity of surrounding fat. Bowel is unremarkable. Small volume ascites. No acute fracture is evident in the chest, abdomen or pelvis. There is a destructive bone lesion with associated soft tissue mass at the anterior aspect of the left femur greater trochanter, seen to best advantage on coronal images 76 series 304. This soft tissue component measures 1.9 x 2.5 cm. There also are lytic defects of the left femoral subcapital and intertrochanteric regions which are worrisome for risk of pathologic fracture. IMPRESSION: 1. No evidence of acute traumatic injury in the chest, abdomen or pelvis. 2. 2.5 x 3.6 cm right lower lobe lung mass, suspicious for neoplasm. 3. Bilateral adrenal masses. There also is a 2.2 cm right suprarenal mass more posteriorly which may represent an additional adrenal mass. 4. Lytic lesions of the left femoral neck and intertrochanteric region, worrisome for risk for pathologic fracture. Electronically Signed   By: DAndreas NewportM.D.   On: 12/06/2015 19:46   Ct Femur Left Wo Contrast  12/06/2015  CLINICAL DATA:  55year old male with a history of fracture left femur EXAM: CT OF THE LOWER LEFT EXTREMITY WITHOUT CONTRAST TECHNIQUE: Multidetector CT imaging of the was performed according to the standard protocol. COMPARISON:  Plain film of today's date. Contemporaneously CT of the abdomen/pelvis FINDINGS: Transverse fracture in the mid femur with approximately 26 degrees of lateral angulation at the fracture site. There is medial displacement of the distal fracture fragment by 1/2 shaft  width. Soft tissue somewhat limited by the exclusion of IV contrast, however, there is abnormal soft tissue centered at the site of the fracture involving the type low ex base and expanding outside of the cortex circumferentially at the fracture site. Differentiating abnormal/pathologic soft tissue from hematoma is difficult. Permeative changes of the cortex at the fracture site with mild periosteal reaction and a wide zone of transition at the lesion. Lytic lesion at the greater trochanter, the femoral head neck junction, and the posterior femoral neck. Partially imaged right hydrocele versus lesion. IMPRESSION: Pathologic fracture at the mid left femoral diaphysis, with approximately 26 degrees of angulation at the fracture site, and permeative bone changes of the cortex. There is abnormal soft tissue within the medullary space centered at the fracture site, with circumferential soft tissue about the femur, and a wide zone of transition.  The soft tissue may either represent a primary osteosarcoma, or metastatic disease. Additional lytic lesions of the femur involves the femoral neck junction, posterior femoral neck, and the greater trochanter, compatible with metastatic disease. Partially imaged right-sided hydrocele versus scrotal lesion. Recommend correlation with physical exam. Signed, Dulcy Fanny. Earleen Newport, DO [facility] Electronically Signed   By: Corrie Mckusick D.O.   On: 12/06/2015 19:46   Dg Femur Min 2 Views Left  12/06/2015  CLINICAL DATA:  Patient twisted leg with mid femur pain. Initial encounter. EXAM: LEFT FEMUR 2 VIEWS COMPARISON:  None. FINDINGS: There is a transverse displaced fracture through the mid femoral diaphysis. There is an irregular lytic lesion involving the mid femoral diaphysis at the fracture site with overlying periosteal reaction. Overlying soft tissue swelling. IMPRESSION: Displaced transverse fracture through the mid femoral  diaphysis. Irregular lytic lesion and periosteal reaction involving the mid femoral diaphysis at the fracture site is concerning for the possibility of metastatic disease or multiple myeloma. Electronically Signed   By: Lovey Newcomer M.D.   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 The patient had right lower lobe lung mass in addition to mass in the posterior left chest wall as well as bilateral adrenal lesions and metastatic bone lesions with left femoral fracture.   PLAN: I had a lengthy discussion with the patient today about his current disease status and further investigation to confirm the diagnosis. I recommended for the patient to have MRI of the brain to rule out brain metastasis. For tissue diagnosis, I would recommend for the patient to have either ultrasound guided or CT-guided core biopsy of the left posterior chest wall mass or the right lower lobe pulmonary mass by interventional radiology. For the left tumor fraction, the patient will benefit from surgical intervention and biopsy at the same time. The patient may also benefit from a course of palliative radiotherapy to the left femur after the surgical intervention. For pain management the patient will continue on Dilaudid, tramadol and naproxen for now. He may also benefit from long-acting pain medication with either MS Contin 30 mg by mouth twice a day or OxyContin before discharge. I will arrange for the patient to have a follow-up visit with me at the [facility] after discharge from the hospital for further evaluation and more detailed discussion of his treatment options based on the final pathology.  The patient voices understanding of current disease status and treatment options and is in agreement with the current care  plan.  All questions were answered. The patient knows to call the clinic with any problems, questions or concerns. We can certainly see the patient much sooner if necessary.  Thank you so much for allowing me to participate in the care of Marvin Hardy. I will continue to follow up the patient with you and assist in his care.  Disclaimer: This note was dictated with voice recognition software. Similar sounding words can inadvertently be transcribed and may not be corrected upon review.   Alper Guilmette K. Dec 07, 2015, 12:55 PM

## 2015-12-07 NOTE — Progress Notes (Signed)
Subjective:  No acute events overnight. Pt was understandbly sad about receiving the diagnosis.   Objective: Vital signs in last 24 hours: Filed Vitals:   12/06/15 1745 12/06/15 1830 12/06/15 2020 12/07/15 0512  BP: 135/81 126/84 135/79 117/69  Pulse: 73 67 62 61  Temp:   98.6 F (37 C) 98.1 F (36.7 C)  TempSrc:   Oral Oral  Resp: _0 Height:   _1  (1.981 m)   Weight:   161 lb 6.4 oz (73.211 kg)   SpO2: 100% 93% 100% 98%   Weight change:   Intake/Output Summary (Last 24 hours) at 12/07/15 1136 Last data filed at 12/07/15 0126  Gross per 24 hour  Intake      0 ml  Output    600 ml  Net   -600 ml    General: Vital signs reviewed. Patient in NAD. Cardiovascular: regular rate, rhythm, no murmur appreciated  Pulmonary/Chest: Clear to auscultation bilaterally, no wheezes, rales, or rhonchi. Back: has a 10x10 cm nontender area at the left posterior axillary line lateral and inferior to left scapula  Abdominal: Soft, non-tender, non-distended, BS + GU: has enlargement of testes- hydrocele Extremities: No lower extremity edema bilaterally, pulses symmetric and intact bilaterally.  Has Buck traction on left lower extremity. Skin: Warm, dry and intact. No rashes or erythema.     Lab Results: Results for orders placed or performed during the hospital encounter of 12/06/15 (from the past 24 hour(s))  CBC     Status: Abnormal   Collection Time: 12/06/15  4:20 PM  Result Value Ref Range   WBC 5.6 4.0 - 10.5 K/uL   RBC 3.19 (L) 4.22 - 5.81 MIL/uL   Hemoglobin 10.6 (L) 13.0 - 17.0 g/dL   HCT 32.3 (L) 39.0 - 52.0 %   MCV 101.3 (H) 78.0 - 100.0 fL   MCH 33.2 26.0 - 34.0 pg   MCHC 32.8 30.0 - 36.0 g/dL   RDW 14.0 11.5 - 15.5 %   Platelets 314 150 - 400 K/uL  Basic metabolic panel     Status: Abnormal   Collection Time: 12/06/15  4:20 PM  Result Value Ref Range   Sodium 138 135 - 145 mmol/L   Potassium 3.6 3.5 - 5.1 mmol/L   Chloride 105 101 - 111 mmol/L   CO2  23 22 - 32 mmol/L   Glucose, Bld 92 65 - 99 mg/dL   BUN 13 6 - 20 mg/dL   Creatinine, Ser 0.64 0.61 - 1.24 mg/dL   Calcium 8.5 (L) 8.9 - 10.3 mg/dL   GFR calc non Af Amer >60 >60 mL/min   GFR calc Af Amer >60 >60 mL/min   Anion gap 10 5 - 15  PSA     Status: None   Collection Time: 12/06/15  5:30 PM  Result Value Ref Range   PSA 0.86 0.00 - 4.00 ng/mL  Hepatic function panel     Status: Abnormal   Collection Time: 12/06/15  7:09 PM  Result Value Ref Range   Total Protein 6.1 (L) 6.5 - 8.1 g/dL   Albumin 2.9 (L) 3.5 - 5.0 g/dL   AST 14 (L) 15 - 41 U/L   ALT 8 (L) 17 - 63 U/L   Alkaline Phosphatase 71 38 - 126 U/L   Total Bilirubin 0.2 (L) 0.3 - 1.2 mg/dL   Bilirubin, Direct <0.1 (L) 0.1 - 0.5 mg/dL   Indirect Bilirubin NOT CALCULATED 0.3 - 0.9 mg/dL  Basic metabolic panel     Status: Abnormal   Collection Time: 12/07/15  3:19 AM  Result Value Ref Range   Sodium 135 135 - 145 mmol/L   Potassium 3.4 (L) 3.5 - 5.1 mmol/L   Chloride 101 101 - 111 mmol/L   CO2 24 22 - 32 mmol/L   Glucose, Bld 93 65 - 99 mg/dL   BUN 8 6 - 20 mg/dL   Creatinine, Ser 4.84 (L) 0.61 - 1.24 mg/dL   Calcium 8.4 (L) 8.9 - 10.3 mg/dL   GFR calc non Af Amer >60 >60 mL/min   GFR calc Af Amer >60 >60 mL/min   Anion gap 10 5 - 15  CBC     Status: Abnormal   Collection Time: 12/07/15  3:19 AM  Result Value Ref Range   WBC 5.6 4.0 - 10.5 K/uL   RBC 3.45 (L) 4.22 - 5.81 MIL/uL   Hemoglobin 11.4 (L) 13.0 - 17.0 g/dL   HCT 14.5 (L) 65.4 - 72.8 %   MCV 100.6 (H) 78.0 - 100.0 fL   MCH 33.0 26.0 - 34.0 pg   MCHC 32.9 30.0 - 36.0 g/dL   RDW 25.0 31.1 - 74.3 %   Platelets 332 150 - 400 K/uL  Vitamin B12     Status: None   Collection Time: 12/07/15  3:19 AM  Result Value Ref Range   Vitamin B-12 889 180 - 914 pg/mL  Folate     Status: None   Collection Time: 12/07/15  3:19 AM  Result Value Ref Range   Folate 15.4 >5.9 ng/mL  Iron and TIBC     Status: Abnormal   Collection Time: 12/07/15  3:19 AM  Result  Value Ref Range   Iron 75 45 - 182 ug/dL   TIBC 436 (L) 205 - 859 ug/dL   Saturation Ratios 33 17.9 - 39.5 %   UIBC 150 ug/dL  Ferritin     Status: None   Collection Time: 12/07/15  3:19 AM  Result Value Ref Range   Ferritin 179 24 - 336 ng/mL  Reticulocytes     Status: Abnormal   Collection Time: 12/07/15  3:19 AM  Result Value Ref Range   Retic Ct Pct 1.4 0.4 - 3.1 %   RBC. 3.45 (L) 4.22 - 5.81 MIL/uL   Retic Count, Manual 48.3 19.0 - 186.0 K/uL    Micro Results: No results found for this or any previous visit (from the past 240 hour(s)). Studies/Results: Dg Chest 1 View  12/06/2015  CLINICAL DATA:  Left femur fracture. Preoperative respiratory examination. EXAM: CHEST 1 VIEW COMPARISON:  None. FINDINGS: The cardiomediastinal silhouette is unremarkable. Pulmonary vascular congestion is noted. There is no evidence of focal airspace disease, pulmonary edema, suspicious pulmonary nodule/mass, pleural effusion, or pneumothorax. No acute bony abnormalities are identified. IMPRESSION: Pulmonary vascular congestion. Electronically Signed   By: Harmon Pier M.D.   On: 12/06/2015 16:35   Ct Chest W Contrast  12/06/2015  CLINICAL DATA:  Multiple myeloma.  Fell today. EXAM: CT CHEST, ABDOMEN, AND PELVIS WITH CONTRAST TECHNIQUE: Multidetector CT imaging of the chest, abdomen and pelvis was performed following the standard protocol during bolus administration of intravenous contrast. CONTRAST:  ISOVUE-300 IOPAMIDOL (ISOVUE-300) INJECTION 61% COMPARISON:  None. FINDINGS: CT CHEST No pneumothorax. No effusions. No acute fracture. No intrathoracic vascular injury. No mediastinal or hilar adenopathy. Axillary regions are unremarkable. There is a 2.5 x 3.6 cm mass in the posterior basal segment of the right lower  lobe, suspicious for neoplasm. Extensive centrilobular emphysematous changes are present in the upper lobes. CT ABDOMEN AND PELVIS No acute traumatic findings. No peritoneal blood or free air.  Liver, spleen, pancreas and kidneys are unremarkable and intact. Aorta and IVC are normal in caliber, intact. There are adrenal masses bilaterally, measuring approximately 2 cm. There also is a 2.2 cm mass at the upper pole the right kidney which might represent a far posterior right adrenal nodule. The adrenals are poorly defined due to the marked paucity of surrounding fat. Bowel is unremarkable. Small volume ascites. No acute fracture is evident in the chest, abdomen or pelvis. There is a destructive bone lesion with associated soft tissue mass at the anterior aspect of the left femur greater trochanter, seen to best advantage on coronal images 76 series 304. This soft tissue component measures 1.9 x 2.5 cm. There also are lytic defects of the left femoral subcapital and intertrochanteric regions which are worrisome for risk of pathologic fracture. IMPRESSION: 1. No evidence of acute traumatic injury in the chest, abdomen or pelvis. 2. 2.5 x 3.6 cm right lower lobe lung mass, suspicious for neoplasm. 3. Bilateral adrenal masses. There also is a 2.2 cm right suprarenal mass more posteriorly which may represent an additional adrenal mass. 4. Lytic lesions of the left femoral neck and intertrochanteric region, worrisome for risk for pathologic fracture. Electronically Signed   By: Ellery Plunk M.D.   On: 12/06/2015 19:46   US Scrotum  12/07/2015  CLINICAL DATA:  Right scrotal swelling for 2 months EXAM: ULTRASOUND OF SCROTUM TECHNIQUE: Complete ultrasound examination of the testicles, epididymis, and other scrotal structures was performed. COMPARISON:  None. FINDINGS: Right testicle Measurements: 3.5 x 3.1 x 2.7 cm. There is a 3 x 3 x 2 mm cyst in the right testis. No noncystic mass or microlithiasis visualized. Color flow is seen in the right testis. Left testicle Measurements: 4.3 x 3.5 x 2.8 cm. There is a 3 x 3 x 2 mm cyst in the left testis. No noncystic mass or microlithiasis visualized. Color flow is  seen in the left testis. Right epididymis:  Normal in size and appearance. Left epididymis: No inflammatory focus. There are multiple cysts arising from the epididymal head, largest measuring 1 x 1 x 0.8 cm. Hydrocele: There is a large hydrocele on the right. There is a much smaller hydrocele on the left. Varicocele:  None visualized. No scrotal wall thickening or scrotal abscess. IMPRESSION: Sizable right hydrocele of uncertain etiology. Small left hydrocele. There are several epididymal head cysts/spermatoceles on the left. There is no epididymal inflammation on either side. There is a tiny cyst in each testis. Etiology uncertain. No noncystic testicular mass is identified. Given these small intratesticular cysts, a followup study in 3-4 months to assess for stability would be reasonable. Color flow is appreciated in each testis. Electronically Signed   By: Bretta Bang III M.D.   On: 12/07/2015 08:53   Ct Abdomen Pelvis W Contrast  12/06/2015  CLINICAL DATA:  Multiple myeloma.  Fell today. EXAM: CT CHEST, ABDOMEN, AND PELVIS WITH CONTRAST TECHNIQUE: Multidetector CT imaging of the chest, abdomen and pelvis was performed following the standard protocol during bolus administration of intravenous contrast. CONTRAST:  ISOVUE-300 IOPAMIDOL (ISOVUE-300) INJECTION 61% COMPARISON:  None. FINDINGS: CT CHEST No pneumothorax. No effusions. No acute fracture. No intrathoracic vascular injury. No mediastinal or hilar adenopathy. Axillary regions are unremarkable. There is a 2.5 x 3.6 cm mass in the posterior basal segment of  the right lower lobe, suspicious for neoplasm. Extensive centrilobular emphysematous changes are present in the upper lobes. CT ABDOMEN AND PELVIS No acute traumatic findings. No peritoneal blood or free air. Liver, spleen, pancreas and kidneys are unremarkable and intact. Aorta and IVC are normal in caliber, intact. There are adrenal masses bilaterally, measuring approximately 2 cm. There  also is a 2.2 cm mass at the upper pole the right kidney which might represent a far posterior right adrenal nodule. The adrenals are poorly defined due to the marked paucity of surrounding fat. Bowel is unremarkable. Small volume ascites. No acute fracture is evident in the chest, abdomen or pelvis. There is a destructive bone lesion with associated soft tissue mass at the anterior aspect of the left femur greater trochanter, seen to best advantage on coronal images 76 series 304. This soft tissue component measures 1.9 x 2.5 cm. There also are lytic defects of the left femoral subcapital and intertrochanteric regions which are worrisome for risk of pathologic fracture. IMPRESSION: 1. No evidence of acute traumatic injury in the chest, abdomen or pelvis. 2. 2.5 x 3.6 cm right lower lobe lung mass, suspicious for neoplasm. 3. Bilateral adrenal masses. There also is a 2.2 cm right suprarenal mass more posteriorly which may represent an additional adrenal mass. 4. Lytic lesions of the left femoral neck and intertrochanteric region, worrisome for risk for pathologic fracture. Electronically Signed   By: Andreas Newport M.D.   On: 12/06/2015 19:46   Ct Femur Left Wo Contrast  12/06/2015  CLINICAL DATA:  56 year old male with a history of fracture left femur EXAM: CT OF THE LOWER LEFT EXTREMITY WITHOUT CONTRAST TECHNIQUE: Multidetector CT imaging of the was performed according to the standard protocol. COMPARISON:  Plain film of today's date. Contemporaneously CT of the abdomen/pelvis FINDINGS: Transverse fracture in the mid femur with approximately 26 degrees of lateral angulation at the fracture site. There is medial displacement of the distal fracture fragment by 1/2 shaft width. Soft tissue somewhat limited by the exclusion of IV contrast, however, there is abnormal soft tissue centered at the site of the fracture involving the type low ex base and expanding outside of the cortex circumferentially at the fracture  site. Differentiating abnormal/pathologic soft tissue from hematoma is difficult. Permeative changes of the cortex at the fracture site with mild periosteal reaction and a wide zone of transition at the lesion. Lytic lesion at the greater trochanter, the femoral head neck junction, and the posterior femoral neck. Partially imaged right hydrocele versus lesion. IMPRESSION: Pathologic fracture at the mid left femoral diaphysis, with approximately 26 degrees of angulation at the fracture site, and permeative bone changes of the cortex. There is abnormal soft tissue within the medullary space centered at the fracture site, with circumferential soft tissue about the femur, and a wide zone of transition. The soft tissue may either represent a primary osteosarcoma, or metastatic disease. Additional lytic lesions of the femur involves the femoral neck junction, posterior femoral neck, and the greater trochanter, compatible with metastatic disease. Partially imaged right-sided hydrocele versus scrotal lesion. Recommend correlation with physical exam. Signed, Dulcy Fanny. Earleen Newport, DO [facility] Electronically Signed   By: Corrie Mckusick D.O.   On: 12/06/2015 19:46   Dg Femur Min 2 Views Left  12/06/2015  CLINICAL DATA:  Patient twisted leg with mid femur pain. Initial encounter. EXAM: LEFT FEMUR 2 VIEWS COMPARISON:  None. FINDINGS: There is a transverse displaced fracture through the mid femoral diaphysis.  There is an irregular lytic lesion involving the mid femoral diaphysis at the fracture site with overlying periosteal reaction. Overlying soft tissue swelling. IMPRESSION: Displaced transverse fracture through the mid femoral diaphysis. Irregular lytic lesion and periosteal reaction involving the mid femoral diaphysis at the fracture site is concerning for the possibility of metastatic disease or multiple myeloma. Electronically Signed   By: Lovey Newcomer M.D.   On:  12/06/2015 16:17   Medications: I have reviewed the patient's current medications. Scheduled Meds: . enoxaparin (LOVENOX) injection  40 mg Subcutaneous Q24H  . HYDROmorphone      . HYDROmorphone   Intravenous Q4H  . HYDROmorphone  1 mg Intravenous Once  . ketorolac  15 mg Intravenous Q6H  . potassium chloride  20 mEq Oral Once   Continuous Infusions:  PRN Meds:.diphenhydrAMINE **OR** diphenhydrAMINE, naloxone **AND** sodium chloride flush, ondansetron (ZOFRAN) IV, promethazine Assessment/Plan: Active Problems:   Femur fracture (HCC)   Femur fracture, left (HCC)   Closed fracture of shaft of left femur (HCC)   Swelling of right testicle   Fracture   Mass of soft tissue   Pain  Metastatic cancer, Likely primary lung cancer: Patient will need a CT guided biopsy of the several soft tissue masses. Consulted IR for the biopsy. Discussed with Oncology Dr Julien Nordmann who recommends that to be done first, and the biopsy to be taken from the soft tissue mass, or adrenal, but not the femur or bone.  Would also like a brain MRI for staging.   -CT guided biopsy- consulted IR -Consulted Oncology- will see the pt -Orthopedics following- recommend CT guided biopsy first to determine the type of cancer first and plan surgical management depending. On that.  -follow up multiple myeloma panel, kappa lambda light chain, 24 hr urine IFE, serum urine electrophoresis -ordered brain MRI per oncology   -chaplain support   -PCA pump for pain- dilaudid 1 mg q4 hours  -phenergan 25 mg q6 hours PRN   Pathologic left femur fracture: in the setting of metastatic cancer. Left femoral neck lytic lesion   -orthopedics following , on buck traction -surgical management depending on biopsy results    Right swollen testicle: Ultrasound showing hydrocele -outpatient follow up    Anaemia of chronic disease- HgB stable and MCV is borderline high    Hypokalemia: K of 3.4  -repleted    Dispo: Disposition is  deferred at this time, awaiting improvement of current medical problems.  Anticipated discharge in approximately 3 day(s).   The patient does have a current PCP (No Pcp Per Patient) and does not need an [facility] hospital follow-up appointment after discharge.  The patient does not have transportation limitations that hinder transportation to clinic appointments.  .Services Needed at time of discharge: Y = Yes, Blank = No PT:   OT:   RN:   Equipment:   Other:     LOS: 1 day   Marvin Estelle, MD 12/07/2015, 11:36 AM

## 2015-12-08 ENCOUNTER — Inpatient Hospital Stay (HOSPITAL_COMMUNITY): Payer: BLUE CROSS/BLUE SHIELD

## 2015-12-08 LAB — BASIC METABOLIC PANEL
Anion gap: 9 (ref 5–15)
BUN: 15 mg/dL (ref 6–20)
CALCIUM: 8.2 mg/dL — AB (ref 8.9–10.3)
CO2: 25 mmol/L (ref 22–32)
Chloride: 100 mmol/L — ABNORMAL LOW (ref 101–111)
Creatinine, Ser: 0.67 mg/dL (ref 0.61–1.24)
GFR calc non Af Amer: >60 mL/min (ref >60)
GLUCOSE: 94 mg/dL (ref 65–99)
Potassium: 3.9 mmol/L (ref 3.5–5.1)
Sodium: 134 mmol/L — ABNORMAL LOW (ref 135–145)

## 2015-12-08 LAB — PROTEIN ELECTROPHORESIS, SERUM
A/G RATIO SPE: 1 (ref 0.7–1.7)
ALBUMIN ELP: 3.1 g/dL (ref 2.9–4.4)
ALPHA-1-GLOBULIN: 0.3 g/dL (ref 0.0–0.4)
ALPHA-2-GLOBULIN: 0.8 g/dL (ref 0.4–1.0)
BETA GLOBULIN: 1 g/dL (ref 0.7–1.3)
GLOBULIN, TOTAL: 3.1 g/dL (ref 2.2–3.9)
Gamma Globulin: 1 g/dL (ref 0.4–1.8)
Total Protein ELP: 6.2 g/dL (ref 6.0–8.5)

## 2015-12-08 LAB — CBC
HCT: 32.2 % — ABNORMAL LOW (ref 39.0–52.0)
Hemoglobin: 10.6 g/dL — ABNORMAL LOW (ref 13.0–17.0)
MCH: 32.8 pg (ref 26.0–34.0)
MCHC: 32.9 g/dL (ref 30.0–36.0)
MCV: 99.7 fL (ref 78.0–100.0)
PLATELETS: 304 10*3/uL (ref 150–400)
RBC: 3.23 MIL/uL — ABNORMAL LOW (ref 4.22–5.81)
RDW: 13.7 % (ref 11.5–15.5)
WBC: 5.6 10*3/uL (ref 4.0–10.5)

## 2015-12-08 LAB — KAPPA/LAMBDA LIGHT CHAINS
KAPPA FREE LGHT CHN: 29.84 mg/L — AB (ref 3.30–19.40)
Kappa, lambda light chain ratio: 0.63 (ref 0.26–1.65)
LAMDA FREE LIGHT CHAINS: 47.28 mg/L — AB (ref 5.71–26.30)

## 2015-12-08 LAB — PROTIME-INR
INR: 1.08 (ref 0.00–1.49)
PROTHROMBIN TIME: 14.2 s (ref 11.6–15.2)

## 2015-12-08 MED ORDER — HYDROMORPHONE 1 MG/ML IV SOLN
INTRAVENOUS | Status: DC
  Administered 2015-12-08: 1.25 mg via INTRAVENOUS
  Administered 2015-12-08: 5.25 mg via INTRAVENOUS
  Administered 2015-12-08 – 2015-12-09 (×4): 2.25 mg via INTRAVENOUS
  Administered 2015-12-09: 2.5 mg via INTRAVENOUS
  Administered 2015-12-09: 1.5 mg via INTRAVENOUS
  Filled 2015-12-08: qty 25

## 2015-12-08 MED ORDER — MIDAZOLAM HCL 2 MG/2ML IJ SOLN
INTRAMUSCULAR | Status: AC | PRN
  Administered 2015-12-08: 1 mg via INTRAVENOUS
  Administered 2015-12-08: 0.5 mg via INTRAVENOUS

## 2015-12-08 MED ORDER — MIDAZOLAM HCL 2 MG/2ML IJ SOLN
INTRAMUSCULAR | Status: AC
  Filled 2015-12-08: qty 2

## 2015-12-08 MED ORDER — FENTANYL CITRATE (PF) 100 MCG/2ML IJ SOLN
INTRAMUSCULAR | Status: AC
  Filled 2015-12-08: qty 2

## 2015-12-08 MED ORDER — GELATIN ABSORBABLE 12-7 MM EX MISC
CUTANEOUS | Status: AC
  Filled 2015-12-08: qty 1

## 2015-12-08 MED ORDER — LIDOCAINE HCL (PF) 1 % IJ SOLN
INTRAMUSCULAR | Status: AC
  Filled 2015-12-08: qty 10

## 2015-12-08 NOTE — Progress Notes (Signed)
   12/08/15 1448  Clinical Encounter Type  Visited With Patient;Health care provider  Visit Type Initial;Spiritual support  Referral From Physician  Spiritual Encounters  Spiritual Needs Prayer;Emotional  Stress Factors  Patient Stress Factors Health changes;Lack of knowledge   Chaplain responded to a request to visit with the patient who is in the process of receiving a new diagnosis. Patient articulated that several tests are still pending. Chaplain offered support through facilitating a life review, offering empathic listening, and prayer. Spiritual care services available as needed.   Jeri Lager, Chaplain 12/08/2015 2:50 PM

## 2015-12-08 NOTE — Plan of Care (Signed)
Problem: Pain Management: Goal: Pain level will decrease Outcome: Not Progressing Had to change PCA settings d/t increased complaints of pain

## 2015-12-08 NOTE — Progress Notes (Signed)
Patient ID: Marvin Hardy, male   DOB: Mar 03, 1961, 54 y.o.   MRN: [id number] Will plan on surgery tomorrow afternoon (Tuesday) to stabilize his femur fracture most likely.  Will discuss with him in detail later today.

## 2015-12-08 NOTE — Progress Notes (Signed)
Subjective: Mr. Marvin Hardy had no acute events overnight. He reported that he pain is being well controlled with the PCA pump. Mr. Marvin Hardy is eager to hear about his treatment plan.   Objective: Vital signs in last 24 hours: Filed Vitals:   12/08/15 1100 12/08/15 1105 12/08/15 1146 12/08/15 1147  BP: 122/71 115/72    Pulse: 62 61    Temp:      TempSrc:      Resp: _0 Height:      Weight:      SpO2: 100% 100% 100%    Weight change:   Intake/Output Summary (Last 24 hours) at 12/08/15 1251 Last data filed at 12/08/15 0611  Gross per 24 hour  Intake    770 ml  Output    400 ml  Net    370 ml   Physical Exam: BP 115/72 mmHg  Pulse 61  Temp(Src) 98.7 F (37.1 C) (Oral)  Resp 11  Ht _1  (1.981 m)  Wt 73.211 kg (161 lb 6.4 oz)  BMI 18.66 kg/m2  SpO2 100% General appearance: alert, cooperative and mild distress Lungs: clear to auscultation bilaterally Chest wall: no tenderness, hard, nontender, fixated mass on the left chest wall Heart: regular rate and rhythm, S1, S2 normal, no murmur, click, rub or gallop Abdomen: soft, non-tender; bowel sounds normal; no masses, no organomegaly Male genitalia: abnormal findings: right hydrocele   Lab Results: CBC    Component Value Date/Time   WBC 5.6 12/08/2015 0221   RBC 3.23* 12/08/2015 0221   RBC 3.45* 12/07/2015 0319   HGB 10.6* 12/08/2015 0221   HCT 32.2* 12/08/2015 0221   PLT 304 12/08/2015 0221   MCV 99.7 12/08/2015 0221   MCH 32.8 12/08/2015 0221   MCHC 32.9 12/08/2015 0221   RDW 13.7 12/08/2015 0221   BMP Latest Ref Rng 12/08/2015 12/07/2015 12/06/2015  Glucose 65 - 99 mg/dL 94 93 92  BUN 6 - 20 mg/dL _2 Creatinine 0.61 - 1.24 mg/dL 0.67 0.58(L) 0.64  Sodium 135 - 145 mmol/L 134(L) 135 138  Potassium 3.5 - 5.1 mmol/L 3.9 3.4(L) 3.6  Chloride 101 - 111 mmol/L 100(L) 101 105  CO2 22 - 32 mmol/L _3 Calcium 8.9 - 10.3 mg/dL 8.2(L) 8.4(L) 8.5(L)   Micro Results: No results found for this or any  previous visit (from the past 240 hour(s)). Studies/Results: Dg Chest 1 View  12/06/2015  CLINICAL DATA:  Left femur fracture. Preoperative respiratory examination. EXAM: CHEST 1 VIEW COMPARISON:  None. FINDINGS: The cardiomediastinal silhouette is unremarkable. Pulmonary vascular congestion is noted. There is no evidence of focal airspace disease, pulmonary edema, suspicious pulmonary nodule/mass, pleural effusion, or pneumothorax. No acute bony abnormalities are identified. IMPRESSION: Pulmonary vascular congestion. Electronically Signed   By: Margarette Canada M.D.   On: 12/06/2015 16:35   Ct Chest W Contrast  12/06/2015  CLINICAL DATA:  Multiple myeloma.  Fell today. EXAM: CT CHEST, ABDOMEN, AND PELVIS WITH CONTRAST TECHNIQUE: Multidetector CT imaging of the chest, abdomen and pelvis was performed following the standard protocol during bolus administration of intravenous contrast. CONTRAST:  119m ISOVUE-300 IOPAMIDOL (ISOVUE-300) INJECTION 61% COMPARISON:  None. FINDINGS: CT CHEST No pneumothorax. No effusions. No acute fracture. No intrathoracic vascular injury. No mediastinal or hilar adenopathy. Axillary regions are unremarkable. There is a 2.5 x 3.6 cm mass in the posterior basal segment of the right lower lobe, suspicious for neoplasm. Extensive centrilobular emphysematous changes are present in the upper  lobes. CT ABDOMEN AND PELVIS No acute traumatic findings. No peritoneal blood or free air. Liver, spleen, pancreas and kidneys are unremarkable and intact. Aorta and IVC are normal in caliber, intact. There are adrenal masses bilaterally, measuring approximately 2 cm. There also is a 2.2 cm mass at the upper pole the right kidney which might represent a far posterior right adrenal nodule. The adrenals are poorly defined due to the marked paucity of surrounding fat. Bowel is unremarkable. Small volume ascites. No acute fracture is evident in the chest, abdomen or pelvis. There is a destructive bone lesion  with associated soft tissue mass at the anterior aspect of the left femur greater trochanter, seen to best advantage on coronal images 76 series 304. This soft tissue component measures 1.9 x 2.5 cm. There also are lytic defects of the left femoral subcapital and intertrochanteric regions which are worrisome for risk of pathologic fracture. IMPRESSION: 1. No evidence of acute traumatic injury in the chest, abdomen or pelvis. 2. 2.5 x 3.6 cm right lower lobe lung mass, suspicious for neoplasm. 3. Bilateral adrenal masses. There also is a 2.2 cm right suprarenal mass more posteriorly which may represent an additional adrenal mass. 4. Lytic lesions of the left femoral neck and intertrochanteric region, worrisome for risk for pathologic fracture. Electronically Signed   By: Andreas Newport M.D.   On: 12/06/2015 19:46   [name] CV Contrast  12/07/2015  CLINICAL DATA:  55 year old male with pathologic fracture left femur. Lung lesion. Evaluate for intracranial metastatic disease. Initial encounter. EXAM: MRI HEAD WITHOUT AND WITH CONTRAST TECHNIQUE: Multiplanar, multiecho pulse sequences of the brain and surrounding structures were obtained without and with intravenous contrast. CONTRAST:  37m MULTIHANCE GADOBENATE DIMEGLUMINE 529 MG/ML IV SOLN COMPARISON:  None. FINDINGS: Exam is motion degraded. In particular, post-contrast sequences are significantly motion degraded limiting evaluation for detection of subtle abnormality. No intracranial enhancing lesion detected. 4.1 x 2.6 x 2.2 cm mass adjacent to the left lateral pterygoid muscle. This is suspicious for possibility of metastatic disease. Given the motion degradation and this finding, contrast-enhanced CT of the head and neck recommended for further delineation. No acute infarct or intracranial hemorrhage. Major intracranial vascular structures appear patent. Abnormal signal intensity bone marrow upper cervical spine raises possibility of infiltration by  tumor or result of underlying anemia. Slightly heterogeneous bone marrow of the calvarium. Attention to the calvarium on CT bone windows. IMPRESSION: Post-contrast sequences are significantly motion degraded limiting evaluation for detection of subtle abnormality. No intracranial enhancing lesion detected. 4.1 x 2.6 x 2.2 cm mass adjacent to the left lateral pterygoid muscle. This is suspicious for possibility of metastatic disease. Given the motion degradation and this finding, contrast-enhanced CT of the head and neck recommended for further delineation. Abnormal signal intensity of bone marrow of the upper cervical spine raises possibility of infiltration by tumor or result of underlying anemia. Slightly heterogeneous bone marrow of the calvarium. Attention to the calvarium on CT bone windows to evaluate for possibility of myeloma. Electronically Signed   By: SGenia DelM.D.   On: 12/07/2015 13:26   [place]rot[place]  12/07/2015  CLINICAL DATA:  Right scrotal swelling for 2 months EXAM: ULTRASOUND OF SCROTUM TECHNIQUE: Complete ultrasound examination of the testicles, epididymis, and other scrotal structures was performed. COMPARISON:  None. FINDINGS: Right testicle Measurements: 3.5 x 3.1 x 2.7 cm. There is a 3 x 3 x 2 mm cyst in the right testis. No noncystic mass or microlithiasis visualized. Color flow  is seen in the right testis. Left testicle Measurements: 4.3 x 3.5 x 2.8 cm. There is a 3 x 3 x 2 mm cyst in the left testis. No noncystic mass or microlithiasis visualized. Color flow is seen in the left testis. Right epididymis:  Normal in size and appearance. Left epididymis: No inflammatory focus. There are multiple cysts arising from the epididymal head, largest measuring 1 x 1 x 0.8 cm. Hydrocele: There is a large hydrocele on the right. There is a much smaller hydrocele on the left. Varicocele:  None visualized. No scrotal wall thickening or scrotal abscess. IMPRESSION: Sizable right hydrocele of uncertain  etiology. Small left hydrocele. There are several epididymal head cysts/spermatoceles on the left. There is no epididymal inflammation on either side. There is a tiny cyst in each testis. Etiology uncertain. No noncystic testicular mass is identified. Given these small intratesticular cysts, a followup study in 3-4 months to assess for stability would be reasonable. Color flow is appreciated in each testis. Electronically Signed   By: Lowella Grip III M.D.   On: 12/07/2015 08:53   Ct Abdomen Pelvis W Contrast  12/06/2015  CLINICAL DATA:  Multiple myeloma.  Fell today. EXAM: CT CHEST, ABDOMEN, AND PELVIS WITH CONTRAST TECHNIQUE: Multidetector CT imaging of the chest, abdomen and pelvis was performed following the standard protocol during bolus administration of intravenous contrast. CONTRAST:  170m ISOVUE-300 IOPAMIDOL (ISOVUE-300) INJECTION 61% COMPARISON:  None. FINDINGS: CT CHEST No pneumothorax. No effusions. No acute fracture. No intrathoracic vascular injury. No mediastinal or hilar adenopathy. Axillary regions are unremarkable. There is a 2.5 x 3.6 cm mass in the posterior basal segment of the right lower lobe, suspicious for neoplasm. Extensive centrilobular emphysematous changes are present in the upper lobes. CT ABDOMEN AND PELVIS No acute traumatic findings. No peritoneal blood or free air. Liver, spleen, pancreas and kidneys are unremarkable and intact. Aorta and IVC are normal in caliber, intact. There are adrenal masses bilaterally, measuring approximately 2 cm. There also is a 2.2 cm mass at the upper pole the right kidney which might represent a far posterior right adrenal nodule. The adrenals are poorly defined due to the marked paucity of surrounding fat. Bowel is unremarkable. Small volume ascites. No acute fracture is evident in the chest, abdomen or pelvis. There is a destructive bone lesion with associated soft tissue mass at the anterior aspect of the left femur greater trochanter, seen  to best advantage on coronal images 76 series 304. This soft tissue component measures 1.9 x 2.5 cm. There also are lytic defects of the left femoral subcapital and intertrochanteric regions which are worrisome for risk of pathologic fracture. IMPRESSION: 1. No evidence of acute traumatic injury in the chest, abdomen or pelvis. 2. 2.5 x 3.6 cm right lower lobe lung mass, suspicious for neoplasm. 3. Bilateral adrenal masses. There also is a 2.2 cm right suprarenal mass more posteriorly which may represent an additional adrenal mass. 4. Lytic lesions of the left femoral neck and intertrochanteric region, worrisome for risk for pathologic fracture. Electronically Signed   By: DAndreas NewportM.D.   On: 12/06/2015 19:46   Ct Femur Left Wo Contrast  12/06/2015  CLINICAL DATA:  55year old male with a history of fracture left femur EXAM: CT OF THE LOWER LEFT EXTREMITY WITHOUT CONTRAST TECHNIQUE: Multidetector CT imaging of the was performed according to the standard protocol. COMPARISON:  Plain film of today's date. Contemporaneously CT of the abdomen/pelvis FINDINGS: Transverse fracture in the mid femur with approximately 26  degrees of lateral angulation at the fracture site. There is medial displacement of the distal fracture fragment by 1/2 shaft width. Soft tissue somewhat limited by the exclusion of IV contrast, however, there is abnormal soft tissue centered at the site of the fracture involving the type low ex base and expanding outside of the cortex circumferentially at the fracture site. Differentiating abnormal/pathologic soft tissue from hematoma is difficult. Permeative changes of the cortex at the fracture site with mild periosteal reaction and a wide zone of transition at the lesion. Lytic lesion at the greater trochanter, the femoral head neck junction, and the posterior femoral neck. Partially imaged right hydrocele versus lesion. IMPRESSION: Pathologic fracture at the mid left femoral diaphysis, with  approximately 26 degrees of angulation at the fracture site, and permeative bone changes of the cortex. There is abnormal soft tissue within the medullary space centered at the fracture site, with circumferential soft tissue about the femur, and a wide zone of transition. The soft tissue may either represent a primary osteosarcoma, or metastatic disease. Additional lytic lesions of the femur involves the femoral neck junction, posterior femoral neck, and the greater trochanter, compatible with metastatic disease. Partially imaged right-sided hydrocele versus scrotal lesion. Recommend correlation with physical exam. Signed, Dulcy Fanny. Earleen Newport, DO [facility] Radiology Electronically Signed   By: Corrie Mckusick D.O.   On: 12/06/2015 19:46   US Biopsy  12/08/2015  INDICATION: Left chest wall palpable mass, right lower lobe mass, concern for metastatic disease EXAM: ULTRASOUND GUIDED LEFT CHEST WALL SOFT TISSUE MASS BIOPSY MEDICATIONS: 1% LIDOCAINE LOCALLY ANESTHESIA/SEDATION: Versed 1.5 mg IV Moderate Sedation Time:  10 minutes The patient was continuously monitored during the procedure by the interventional radiology nurse under my direct supervision. PROCEDURE: The procedure, risks, benefits, and alternatives were explained to the patient. Questions regarding the procedure were encouraged and answered. The patient understands and consents to the procedure. The left chest wall soft tissue mass was prepped with ChloraPrep in a sterile fashion, and a sterile drape was applied covering the operative field. A sterile gown and sterile gloves were used for the procedure. Local anesthesia was provided with 1% Lidocaine. Previous imaging reviewed. Preliminary ultrasound performed. The left chest wall soft tissue mass was imaged with ultrasound. Overlying skin was marked. Under sterile conditions and local anesthesia, a 17 gauge coaxial needle was advanced into the lesion under  direct ultrasound. Images obtained for documentation. 3 18 gauge core biopsies obtained through the access. Needle tract embolized with Gel-Foam. Samples were placed in formalin. No immediate complication. Patient tolerated the procedure well. COMPLICATIONS: None immediate. FINDINGS: Imaging confirms needle placed in in the left chest wall mass for core biopsy IMPRESSION: Successful ultrasound left chest wall soft tissue mass 18 gauge core biopsy Electronically Signed   By: Jerilynn Mages.  Shick M.D.   On: 12/08/2015 12:33   Dg Femur Min 2 Views Left  12/06/2015  CLINICAL DATA:  Patient twisted leg with mid femur pain. Initial encounter. EXAM: LEFT FEMUR 2 VIEWS COMPARISON:  None. FINDINGS: There is a transverse displaced fracture through the mid femoral diaphysis. There is an irregular lytic lesion involving the mid femoral diaphysis at the fracture site with overlying periosteal reaction. Overlying soft tissue swelling. IMPRESSION: Displaced transverse fracture through the mid femoral diaphysis. Irregular lytic lesion and periosteal reaction involving the mid femoral diaphysis at the fracture site is concerning for the possibility of metastatic disease or multiple myeloma. Electronically Signed   By: Polly Cobia.D.  On: 12/06/2015 16:17   Medications: I have reviewed the patient's current medications. Scheduled Meds: . enoxaparin (LOVENOX) injection  40 mg Subcutaneous Q24H  . gelatin adsorbable      . HYDROmorphone   Intravenous Q4H  . ketorolac  15 mg Intravenous Q6H  . lidocaine (PF)      . midazolam       Continuous Infusions:  PRN Meds:.diphenhydrAMINE **OR** diphenhydrAMINE, naloxone **AND** sodium chloride flush, ondansetron (ZOFRAN) IV, promethazine  Assessment/Plan: Mr. Stailey is a 55 yo previously healthy man who presented to the ED with a pathological fracture of his left femur who was found to have multiple tumors involving the left femur, bilateral adrenals and left axillary chest wall.    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 Xray showed transverse displacement to the mid femoral diaphysis with irregular lytic lesions and periosteal reaction involving the mid femoral diaphysis at the fracture site. This is concerning for either a primary bone cancer or metastatic disease. CT of the femur showed a pathologic fracture at the mid femoral diaphysis with permeative bone changes in the cortex. Additionally, there was abnormal soft tissue within the medullary space that extended into the fracture site. This is concerning for an osteosarcoma or metastatic disease. CT of chest, abdomen and pelvis revealed a right lower lung mass, possibly tumor. There were also bilateral adrenal masses found. There was an additional suprarenal mass found. On exam, a large, firm, nontender, fixated mass was found on the left chest wall near the axillary line that has been present for about 3 months.  -f/u on US guided biopsy of lateral chest wall mass  -appreciate oncology recs  -pain management: PCA dilaudid, toradol 30m q6 IV   Right swollen testicle  Right testicle swollen on exam. CT of femur showing right-sided hydrocele versus scrotal lesion. PSA is 0.86. -scrotal ultrasound showed hydrocele   T wave inversion in V3 EKG showing NSR with T wave inversion in V3. Patient is symptomatic- no CP, dizziness, or dyspnea. -EKG normal  Macrocytic anemia H+H 10.6/32.2 and MCV 99.7 -continue following   This is a MCareers information officerNote.  The care of the patient was discussed with Dr. STiburcio Peaand the assessment and plan formulated with their assistance.  Please see their attached note for official documentation of the daily encounter.   LOS: 2 days   LWandra Mannan Med Student 12/08/2015, 12:51 PM

## 2015-12-08 NOTE — Procedures (Signed)
Successful LT POSTERIOR CHEST WALL MASS [place] BX No comp Stable Path pending Full report in PACS

## 2015-12-08 NOTE — Consult Note (Signed)
Chief Complaint: Patient was seen in consultation today for left posterior chest wall soft tissue mass biopsy Chief Complaint  Patient presents with  . Leg Pain   at the request of Dr Curt Bears  Referring Physician(s): Dr Curt Bears  Supervising Physician: Daryll Brod  Patient Status: In-pt   History of Present Illness: Marvin Hardy is a 55 y.o. male   Pt admitted 5/6 with worsening left leg pain Felt pop after missing step at work  Femur xray: IMPRESSION: Displaced transverse fracture through the mid femoral diaphysis.  Irregular lytic lesion and periosteal reaction involving the mid femoral diaphysis at the fracture site is concerning for the possibility of metastatic disease or multiple myeloma.  CT CAP IMPRESSION: 1. No evidence of acute traumatic injury in the chest, abdomen or pelvis. 2. 2.5 x 3.6 cm right lower lobe lung mass, suspicious for neoplasm. 3. Bilateral adrenal masses. There also is a 2.2 cm right suprarenal mass more posteriorly which may represent an additional adrenal mass. 4. Lytic lesions of the left femoral neck and intertrochanteric region, worrisome for risk for pathologic fracture.  MR Brain IMPRESSION: Post-contrast sequences are significantly motion degraded limiting evaluation for detection of subtle abnormality.  No intracranial enhancing lesion detected.  4.1 x 2.6 x 2.2 cm mass adjacent to the left lateral pterygoid muscle. This is suspicious for possibility of metastatic disease. Given the motion degradation and this finding, contrast-enhanced CT of the head and neck recommended for further delineation.  Abnormal signal intensity of bone marrow of the upper cervical spine raises possibility of infiltration by tumor or result of underlying anemia.  Slightly heterogeneous bone marrow of the calvarium. Attention to the calvarium on CT bone windows to evaluate for possibility of Myeloma.  Left posterior  chest wall mass x 6 weeks; enlarging Request for L posterior chest wall soft tissue mass bx for tissue diagnosis per Dr Julien Nordmann Imaging reviewed with IR Rad and approved for procedure   History reviewed. No pertinent past medical history.  History reviewed. No pertinent past surgical history.  Allergies: Aleve  Medications: Prior to Admission medications   Medication Sig Start Date End Date Taking? Authorizing Provider  acetaminophen (TYLENOL) 500 MG tablet Take 1 tablet (500 mg total) by mouth every 6 (six) hours as needed. Patient taking differently: Take 500 mg by mouth every 6 (six) hours as needed for moderate pain.  09/04/15  Yes Gloriann Loan, PA-C  Aspirin-Salicylamide-Caffeine (BC FAST PAIN RELIEF) [phone] MG PACK Take 1 Package by mouth daily as needed (for pain).   Yes Historical Provider, MD  ibuprofen (ADVIL,MOTRIN) 800 MG tablet Take 800 mg by mouth every 4 (four) hours as needed for moderate pain.   Yes Historical Provider, MD  naproxen (NAPROSYN) 500 MG tablet Take 1 tablet (500 mg total) by mouth 2 (two) times daily. 09/04/15   Gloriann Loan, PA-C     Family History  Problem Relation Age of Onset  . Hypertension Mother   . Heart disease Mother   . Diabetes Mother   . Lung cancer Father     Social History   Social History  . Marital Status: Single    Spouse Name: N/A  . Number of Children: N/A  . Years of Education: N/A   Social History Main Topics  . Smoking status: Current Every Day Smoker    Types: Cigars  . Smokeless tobacco: None     Comment: Smoking 1 cigar per day for the past 25 yrs  . Alcohol  Use: 0.0 oz/week    0 Standard drinks or equivalent per week     Comment: Beer on weekends (patient is not sure how much)  . Drug Use: No  . Sexual Activity: Not Asked   Other Topics Concern  . None   Social History Narrative      Review of Systems: A 12 point ROS discussed and pertinent positives are indicated in the HPI above.  All other systems are  negative.  Review of Systems  Constitutional: Positive for activity change, appetite change, fatigue and unexpected weight change. Negative for fever.  Eyes: Negative for visual disturbance.  Respiratory: Negative for shortness of breath.   Gastrointestinal: Positive for abdominal pain.  Musculoskeletal: Positive for back pain.  Neurological: Positive for weakness.  Psychiatric/Behavioral: Negative for behavioral problems and confusion.    Vital Signs: BP 117/58 mmHg  Pulse 62  Temp(Src) 98.7 F (37.1 C) (Oral)  Resp 16  Ht 6' 6"  (1.981 m)  Wt 161 lb 6.4 oz (73.211 kg)  BMI 18.66 kg/m2  SpO2 100%  Physical Exam  Constitutional: He is oriented to person, place, and time.  Cardiovascular: Normal rate, regular rhythm and normal heart sounds.   Pulmonary/Chest: Effort normal and breath sounds normal.  Abdominal: Soft. Bowel sounds are normal.  Musculoskeletal: Normal range of motion.  Left leg fracture  Neurological: He is alert and oriented to person, place, and time.  Skin: Skin is warm and dry.  Psychiatric: He has a normal mood and affect. His behavior is normal. Judgment and thought content normal.  Nursing note and vitals reviewed.   Mallampati Score:  MD Evaluation Airway: WNL Heart: WNL Abdomen: WNL Chest/ Lungs: WNL ASA  Classification: 3 Mallampati/Airway Score: One  Imaging: Dg Chest 1 View  12/06/2015  CLINICAL DATA:  Left femur fracture. Preoperative respiratory examination. EXAM: CHEST 1 VIEW COMPARISON:  None. FINDINGS: The cardiomediastinal silhouette is unremarkable. Pulmonary vascular congestion is noted. There is no evidence of focal airspace disease, pulmonary edema, suspicious pulmonary nodule/mass, pleural effusion, or pneumothorax. No acute bony abnormalities are identified. IMPRESSION: Pulmonary vascular congestion. Electronically Signed   By: Margarette Canada M.D.   On: 12/06/2015 16:35   Ct Chest W Contrast  12/06/2015  CLINICAL DATA:  Multiple  myeloma.  Fell today. EXAM: CT CHEST, ABDOMEN, AND PELVIS WITH CONTRAST TECHNIQUE: Multidetector CT imaging of the chest, abdomen and pelvis was performed following the standard protocol during bolus administration of intravenous contrast. CONTRAST:  167m ISOVUE-300 IOPAMIDOL (ISOVUE-300) INJECTION 61% COMPARISON:  None. FINDINGS: CT CHEST No pneumothorax. No effusions. No acute fracture. No intrathoracic vascular injury. No mediastinal or hilar adenopathy. Axillary regions are unremarkable. There is a 2.5 x 3.6 cm mass in the posterior basal segment of the right lower lobe, suspicious for neoplasm. Extensive centrilobular emphysematous changes are present in the upper lobes. CT ABDOMEN AND PELVIS No acute traumatic findings. No peritoneal blood or free air. Liver, spleen, pancreas and kidneys are unremarkable and intact. Aorta and IVC are normal in caliber, intact. There are adrenal masses bilaterally, measuring approximately 2 cm. There also is a 2.2 cm mass at the upper pole the right kidney which might represent a far posterior right adrenal nodule. The adrenals are poorly defined due to the marked paucity of surrounding fat. Bowel is unremarkable. Small volume ascites. No acute fracture is evident in the chest, abdomen or pelvis. There is a destructive bone lesion with associated soft tissue mass at the anterior aspect of the left femur greater trochanter,  seen to best advantage on coronal images 76 series 304. This soft tissue component measures 1.9 x 2.5 cm. There also are lytic defects of the left femoral subcapital and intertrochanteric regions which are worrisome for risk of pathologic fracture. IMPRESSION: 1. No evidence of acute traumatic injury in the chest, abdomen or pelvis. 2. 2.5 x 3.6 cm right lower lobe lung mass, suspicious for neoplasm. 3. Bilateral adrenal masses. There also is a 2.2 cm right suprarenal mass more posteriorly which may represent an additional adrenal mass. 4. Lytic lesions of  the left femoral neck and intertrochanteric region, worrisome for risk for pathologic fracture. Electronically Signed   By: Andreas Newport M.D.   On: 12/06/2015 19:46   Mr [name] EP Contrast  12/07/2015  CLINICAL DATA:  56 year old male with pathologic fracture left femur. Lung lesion. Evaluate for intracranial metastatic disease. Initial encounter. EXAM: MRI HEAD WITHOUT AND WITH CONTRAST TECHNIQUE: Multiplanar, multiecho pulse sequences of the brain and surrounding structures were obtained without and with intravenous contrast. CONTRAST:  63m MULTIHANCE GADOBENATE DIMEGLUMINE 529 MG/ML IV SOLN COMPARISON:  None. FINDINGS: Exam is motion degraded. In particular, post-contrast sequences are significantly motion degraded limiting evaluation for detection of subtle abnormality. No intracranial enhancing lesion detected. 4.1 x 2.6 x 2.2 cm mass adjacent to the left lateral pterygoid muscle. This is suspicious for possibility of metastatic disease. Given the motion degradation and this finding, contrast-enhanced CT of the head and neck recommended for further delineation. No acute infarct or intracranial hemorrhage. Major intracranial vascular structures appear patent. Abnormal signal intensity bone marrow upper cervical spine raises possibility of infiltration by tumor or result of underlying anemia. Slightly heterogeneous bone marrow of the calvarium. Attention to the calvarium on CT bone windows. IMPRESSION: Post-contrast sequences are significantly motion degraded limiting evaluation for detection of subtle abnormality. No intracranial enhancing lesion detected. 4.1 x 2.6 x 2.2 cm mass adjacent to the left lateral pterygoid muscle. This is suspicious for possibility of metastatic disease. Given the motion degradation and this finding, contrast-enhanced CT of the head and neck recommended for further delineation. Abnormal signal intensity of bone marrow of the upper cervical spine raises possibility of  infiltration by tumor or result of underlying anemia. Slightly heterogeneous bone marrow of the calvarium. Attention to the calvarium on CT bone windows to evaluate for possibility of myeloma. Electronically Signed   By: SGenia DelM.D.   On: 12/07/2015 13:26   [place]rotum  12/07/2015  CLINICAL DATA:  Right scrotal swelling for 2 months EXAM: ULTRASOUND OF SCROTUM TECHNIQUE: Complete ultrasound examination of the testicles, epididymis, and other scrotal structures was performed. COMPARISON:  None. FINDINGS: Right testicle Measurements: 3.5 x 3.1 x 2.7 cm. There is a 3 x 3 x 2 mm cyst in the right testis. No noncystic mass or microlithiasis visualized. Color flow is seen in the right testis. Left testicle Measurements: 4.3 x 3.5 x 2.8 cm. There is a 3 x 3 x 2 mm cyst in the left testis. No noncystic mass or microlithiasis visualized. Color flow is seen in the left testis. Right epididymis:  Normal in size and appearance. Left epididymis: No inflammatory focus. There are multiple cysts arising from the epididymal head, largest measuring 1 x 1 x 0.8 cm. Hydrocele: There is a large hydrocele on the right. There is a much smaller hydrocele on the left. Varicocele:  None visualized. No scrotal wall thickening or scrotal abscess. IMPRESSION: Sizable right hydrocele of uncertain etiology. Small left hydrocele. There are several  epididymal head cysts/spermatoceles on the left. There is no epididymal inflammation on either side. There is a tiny cyst in each testis. Etiology uncertain. No noncystic testicular mass is identified. Given these small intratesticular cysts, a followup study in 3-4 months to assess for stability would be reasonable. Color flow is appreciated in each testis. Electronically Signed   By: Lowella Grip III M.D.   On: 12/07/2015 08:53   Ct Abdomen Pelvis W Contrast  12/06/2015  CLINICAL DATA:  Multiple myeloma.  Fell today. EXAM: CT CHEST, ABDOMEN, AND PELVIS WITH CONTRAST TECHNIQUE:  Multidetector CT imaging of the chest, abdomen and pelvis was performed following the standard protocol during bolus administration of intravenous contrast. CONTRAST:  179m ISOVUE-300 IOPAMIDOL (ISOVUE-300) INJECTION 61% COMPARISON:  None. FINDINGS: CT CHEST No pneumothorax. No effusions. No acute fracture. No intrathoracic vascular injury. No mediastinal or hilar adenopathy. Axillary regions are unremarkable. There is a 2.5 x 3.6 cm mass in the posterior basal segment of the right lower lobe, suspicious for neoplasm. Extensive centrilobular emphysematous changes are present in the upper lobes. CT ABDOMEN AND PELVIS No acute traumatic findings. No peritoneal blood or free air. Liver, spleen, pancreas and kidneys are unremarkable and intact. Aorta and IVC are normal in caliber, intact. There are adrenal masses bilaterally, measuring approximately 2 cm. There also is a 2.2 cm mass at the upper pole the right kidney which might represent a far posterior right adrenal nodule. The adrenals are poorly defined due to the marked paucity of surrounding fat. Bowel is unremarkable. Small volume ascites. No acute fracture is evident in the chest, abdomen or pelvis. There is a destructive bone lesion with associated soft tissue mass at the anterior aspect of the left femur greater trochanter, seen to best advantage on coronal images 76 series 304. This soft tissue component measures 1.9 x 2.5 cm. There also are lytic defects of the left femoral subcapital and intertrochanteric regions which are worrisome for risk of pathologic fracture. IMPRESSION: 1. No evidence of acute traumatic injury in the chest, abdomen or pelvis. 2. 2.5 x 3.6 cm right lower lobe lung mass, suspicious for neoplasm. 3. Bilateral adrenal masses. There also is a 2.2 cm right suprarenal mass more posteriorly which may represent an additional adrenal mass. 4. Lytic lesions of the left femoral neck and intertrochanteric region, worrisome for risk for  pathologic fracture. Electronically Signed   By: DAndreas NewportM.D.   On: 12/06/2015 19:46   Ct Femur Left Wo Contrast  12/06/2015  CLINICAL DATA:  55year old male with a history of fracture left femur EXAM: CT OF THE LOWER LEFT EXTREMITY WITHOUT CONTRAST TECHNIQUE: Multidetector CT imaging of the was performed according to the standard protocol. COMPARISON:  Plain film of today's date. Contemporaneously CT of the abdomen/pelvis FINDINGS: Transverse fracture in the mid femur with approximately 26 degrees of lateral angulation at the fracture site. There is medial displacement of the distal fracture fragment by 1/2 shaft width. Soft tissue somewhat limited by the exclusion of IV contrast, however, there is abnormal soft tissue centered at the site of the fracture involving the type low ex base and expanding outside of the cortex circumferentially at the fracture site. Differentiating abnormal/pathologic soft tissue from hematoma is difficult. Permeative changes of the cortex at the fracture site with mild periosteal reaction and a wide zone of transition at the lesion. Lytic lesion at the greater trochanter, the femoral head neck junction, and the posterior femoral neck. Partially imaged right hydrocele versus lesion. IMPRESSION: Pathologic  fracture at the mid left femoral diaphysis, with approximately 26 degrees of angulation at the fracture site, and permeative bone changes of the cortex. There is abnormal soft tissue within the medullary space centered at the fracture site, with circumferential soft tissue about the femur, and a wide zone of transition. The soft tissue may either represent a primary osteosarcoma, or metastatic disease. Additional lytic lesions of the femur involves the femoral neck junction, posterior femoral neck, and the greater trochanter, compatible with metastatic disease. Partially imaged right-sided hydrocele versus scrotal lesion. Recommend correlation with physical exam. Signed,  Dulcy Fanny. Earleen Newport, DO [facility] Electronically Signed   By: Corrie Mckusick D.O.   On: 12/06/2015 19:46   Dg Femur Min 2 Views Left  12/06/2015  CLINICAL DATA:  Patient twisted leg with mid femur pain. Initial encounter. EXAM: LEFT FEMUR 2 VIEWS COMPARISON:  None. FINDINGS: There is a transverse displaced fracture through the mid femoral diaphysis. There is an irregular lytic lesion involving the mid femoral diaphysis at the fracture site with overlying periosteal reaction. Overlying soft tissue swelling. IMPRESSION: Displaced transverse fracture through the mid femoral diaphysis. Irregular lytic lesion and periosteal reaction involving the mid femoral diaphysis at the fracture site is concerning for the possibility of metastatic disease or multiple myeloma. Electronically Signed   By: Lovey Newcomer M.D.   On: 12/06/2015 16:17    Labs:  CBC:  Recent Labs  12/06/15 1620 12/07/15 0319 12/08/15 0221  WBC 5.6 5.6 5.6  HGB 10.6* 11.4* 10.6*  HCT 32.3* 34.7* 32.2*  PLT 314 332 304    COAGS:  Recent Labs  12/07/15 1309 12/08/15 0221  INR 1.08 1.08    BMP:  Recent Labs  12/06/15 1620 12/07/15 0319 12/08/15 0221  NA 138 135 134*  K 3.6 3.4* 3.9  CL 105 101 100*  CO2 23 24 25   GLUCOSE 92 93 94  BUN 13 8 15   CALCIUM 8.5* 8.4* 8.2*  CREATININE 0.64 0.58* 0.67  GFRNONAA >60 >60 >60  GFRAA >60 >60 >60    LIVER FUNCTION TESTS:  Recent Labs  12/06/15 1909  BILITOT 0.2*  AST 14*  ALT 8*  ALKPHOS 71  PROT 6.1*  ALBUMIN 2.9*    TUMOR MARKERS: No results for input(s): AFPTM, CEA, CA199, CHROMGRNA in the last 8760 hours.  Assessment and Plan:  Left femur pathologic fx Lung mass; B adrenal masses Posterior chest wall ST mass---enlarging Now scheduled for biopsy of ST mass Risks and Benefits discussed with the patient including, but not limited to bleeding, infection, damage to adjacent structures or low yield  requiring additional tests. All of the patient's questions were answered, patient is agreeable to proceed. Consent signed and in chart.   Thank you for this interesting consult.  I greatly enjoyed meeting Allie Gerhold and look forward to participating in their care.  A copy of this report was sent to the requesting provider on this date.  Electronically Signed: Monia Sabal A 12/08/2015, 8:24 AM   I spent a total of 40 Minutes    in face to face in clinical consultation, greater than 50% of which was counseling/coordinating care for left posterior chest wall soft tissue mass biopsy

## 2015-12-08 NOTE — Progress Notes (Signed)
Subjective:  No acute events overnight.  Underwent CT guided biopsy this morning. Underwent brain MRI yesterday Pain well controlled He denies headaches, vision changes, problems swallowing, denies skin tumors. Does have pain in upper jaw.   Objective: Vital signs in last 24 hours: Filed Vitals:   12/08/15 1100 12/08/15 1105 12/08/15 1146 12/08/15 1147  BP: 122/71 115/72    Pulse: 62 61    Temp:      TempSrc:      Resp: _0 Height:      Weight:      SpO2: 100% 100% 100%    Weight change:   Intake/Output Summary (Last 24 hours) at 12/08/15 1152 Last data filed at 12/08/15 0611  Gross per 24 hour  Intake    770 ml  Output    400 ml  Net    370 ml    General: Vital signs reviewed. Patient in NAD. Cardiovascular: regular rate, rhythm, no murmur appreciated  Pulmonary/Chest: unable to examine lungs due to pt discomfort  Back: has a 10x10 cm nontender area at the left posterior axillary line lateral and inferior to left scapula  Abdominal: Soft, non-tender, non-distended, BS + Extremities:  Has Buck traction on left lower extremity. Knee area is still swollen Skin: Warm, dry and intact. No rashes or erythema.     Lab Results: Results for orders placed or performed during the hospital encounter of 12/06/15 (from the past 24 hour(s))  Protime-INR     Status: None   Collection Time: 12/07/15  1:09 PM  Result Value Ref Range   Prothrombin Time 14.2 11.6 - 15.2 seconds   INR 1.08 0.00 - 6.50  Basic metabolic panel     Status: Abnormal   Collection Time: 12/08/15  2:21 AM  Result Value Ref Range   Sodium 134 (L) 135 - 145 mmol/L   Potassium 3.9 3.5 - 5.1 mmol/L   Chloride 100 (L) 101 - 111 mmol/L   CO2 25 22 - 32 mmol/L   Glucose, Bld 94 65 - 99 mg/dL   BUN 15 6 - 20 mg/dL   Creatinine, Ser 0.67 0.61 - 1.24 mg/dL   Calcium 8.2 (L) 8.9 - 10.3 mg/dL   GFR calc non Af Amer >60 >60 mL/min   GFR calc Af Amer >60 >60 mL/min   Anion gap 9 5 - 15  CBC      Status: Abnormal   Collection Time: 12/08/15  2:21 AM  Result Value Ref Range   WBC 5.6 4.0 - 10.5 K/uL   RBC 3.23 (L) 4.22 - 5.81 MIL/uL   Hemoglobin 10.6 (L) 13.0 - 17.0 g/dL   HCT 32.2 (L) 39.0 - 52.0 %   MCV 99.7 78.0 - 100.0 fL   MCH 32.8 26.0 - 34.0 pg   MCHC 32.9 30.0 - 36.0 g/dL   RDW 13.7 11.5 - 15.5 %   Platelets 304 150 - 400 K/uL  Protime-INR     Status: None   Collection Time: 12/08/15  2:21 AM  Result Value Ref Range   Prothrombin Time 14.2 11.6 - 15.2 seconds   INR 1.08 0.00 - 1.49    Micro Results: No results found for this or any previous visit (from the past 240 hour(s)). Studies/Results: Dg Chest 1 View  12/06/2015  CLINICAL DATA:  Left femur fracture. Preoperative respiratory examination. EXAM: CHEST 1 VIEW COMPARISON:  None. FINDINGS: The cardiomediastinal silhouette is unremarkable. Pulmonary vascular congestion is noted. There is no evidence  of focal airspace disease, pulmonary edema, suspicious pulmonary nodule/mass, pleural effusion, or pneumothorax. No acute bony abnormalities are identified. IMPRESSION: Pulmonary vascular congestion. Electronically Signed   By: Margarette Canada M.D.   On: 12/06/2015 16:35   Ct Chest W Contrast  12/06/2015  CLINICAL DATA:  Multiple myeloma.  Fell today. EXAM: CT CHEST, ABDOMEN, AND PELVIS WITH CONTRAST TECHNIQUE: Multidetector CT imaging of the chest, abdomen and pelvis was performed following the standard protocol during bolus administration of intravenous contrast. CONTRAST:  121m ISOVUE-300 IOPAMIDOL (ISOVUE-300) INJECTION 61% COMPARISON:  None. FINDINGS: CT CHEST No pneumothorax. No effusions. No acute fracture. No intrathoracic vascular injury. No mediastinal or hilar adenopathy. Axillary regions are unremarkable. There is a 2.5 x 3.6 cm mass in the posterior basal segment of the right lower lobe, suspicious for neoplasm. Extensive centrilobular emphysematous changes are present in the upper lobes. CT ABDOMEN AND PELVIS No acute  traumatic findings. No peritoneal blood or free air. Liver, spleen, pancreas and kidneys are unremarkable and intact. Aorta and IVC are normal in caliber, intact. There are adrenal masses bilaterally, measuring approximately 2 cm. There also is a 2.2 cm mass at the upper pole the right kidney which might represent a far posterior right adrenal nodule. The adrenals are poorly defined due to the marked paucity of surrounding fat. Bowel is unremarkable. Small volume ascites. No acute fracture is evident in the chest, abdomen or pelvis. There is a destructive bone lesion with associated soft tissue mass at the anterior aspect of the left femur greater trochanter, seen to best advantage on coronal images 76 series 304. This soft tissue component measures 1.9 x 2.5 cm. There also are lytic defects of the left femoral subcapital and intertrochanteric regions which are worrisome for risk of pathologic fracture. IMPRESSION: 1. No evidence of acute traumatic injury in the chest, abdomen or pelvis. 2. 2.5 x 3.6 cm right lower lobe lung mass, suspicious for neoplasm. 3. Bilateral adrenal masses. There also is a 2.2 cm right suprarenal mass more posteriorly which may represent an additional adrenal mass. 4. Lytic lesions of the left femoral neck and intertrochanteric region, worrisome for risk for pathologic fracture. Electronically Signed   By: DAndreas NewportM.D.   On: 12/06/2015 19:46   Mr BJeri CosWYQContrast  12/07/2015  CLINICAL DATA:  55year old male with pathologic fracture left femur. Lung lesion. Evaluate for intracranial metastatic disease. Initial encounter. EXAM: MRI HEAD WITHOUT AND WITH CONTRAST TECHNIQUE: Multiplanar, multiecho pulse sequences of the brain and surrounding structures were obtained without and with intravenous contrast. CONTRAST:  118mMULTIHANCE GADOBENATE DIMEGLUMINE 529 MG/ML IV SOLN COMPARISON:  None. FINDINGS: Exam is motion degraded. In particular, post-contrast sequences are  significantly motion degraded limiting evaluation for detection of subtle abnormality. No intracranial enhancing lesion detected. 4.1 x 2.6 x 2.2 cm mass adjacent to the left lateral pterygoid muscle. This is suspicious for possibility of metastatic disease. Given the motion degradation and this finding, contrast-enhanced CT of the head and neck recommended for further delineation. No acute infarct or intracranial hemorrhage. Major intracranial vascular structures appear patent. Abnormal signal intensity bone marrow upper cervical spine raises possibility of infiltration by tumor or result of underlying anemia. Slightly heterogeneous bone marrow of the calvarium. Attention to the calvarium on CT bone windows. IMPRESSION: Post-contrast sequences are significantly motion degraded limiting evaluation for detection of subtle abnormality. No intracranial enhancing lesion detected. 4.1 x 2.6 x 2.2 cm mass adjacent to the left lateral pterygoid muscle. This is suspicious for  possibility of metastatic disease. Given the motion degradation and this finding, contrast-enhanced CT of the head and neck recommended for further delineation. Abnormal signal intensity of bone marrow of the upper cervical spine raises possibility of infiltration by tumor or result of underlying anemia. Slightly heterogeneous bone marrow of the calvarium. Attention to the calvarium on CT bone windows to evaluate for possibility of myeloma. Electronically Signed   By: Genia Del M.D.   On: 12/07/2015 13:26   US Scrotum  12/07/2015  CLINICAL DATA:  Right scrotal swelling for 2 months EXAM: ULTRASOUND OF SCROTUM TECHNIQUE: Complete ultrasound examination of the testicles, epididymis, and other scrotal structures was performed. COMPARISON:  None. FINDINGS: Right testicle Measurements: 3.5 x 3.1 x 2.7 cm. There is a 3 x 3 x 2 mm cyst in the right testis. No noncystic mass or microlithiasis visualized. Color flow is seen in the right testis. Left  testicle Measurements: 4.3 x 3.5 x 2.8 cm. There is a 3 x 3 x 2 mm cyst in the left testis. No noncystic mass or microlithiasis visualized. Color flow is seen in the left testis. Right epididymis:  Normal in size and appearance. Left epididymis: No inflammatory focus. There are multiple cysts arising from the epididymal head, largest measuring 1 x 1 x 0.8 cm. Hydrocele: There is a large hydrocele on the right. There is a much smaller hydrocele on the left. Varicocele:  None visualized. No scrotal wall thickening or scrotal abscess. IMPRESSION: Sizable right hydrocele of uncertain etiology. Small left hydrocele. There are several epididymal head cysts/spermatoceles on the left. There is no epididymal inflammation on either side. There is a tiny cyst in each testis. Etiology uncertain. No noncystic testicular mass is identified. Given these small intratesticular cysts, a followup study in 3-4 months to assess for stability would be reasonable. Color flow is appreciated in each testis. Electronically Signed   By: Lowella Grip III M.D.   On: 12/07/2015 08:53   Ct Abdomen Pelvis W Contrast  12/06/2015  CLINICAL DATA:  Multiple myeloma.  Fell today. EXAM: CT CHEST, ABDOMEN, AND PELVIS WITH CONTRAST TECHNIQUE: Multidetector CT imaging of the chest, abdomen and pelvis was performed following the standard protocol during bolus administration of intravenous contrast. CONTRAST:  182m ISOVUE-300 IOPAMIDOL (ISOVUE-300) INJECTION 61% COMPARISON:  None. FINDINGS: CT CHEST No pneumothorax. No effusions. No acute fracture. No intrathoracic vascular injury. No mediastinal or hilar adenopathy. Axillary regions are unremarkable. There is a 2.5 x 3.6 cm mass in the posterior basal segment of the right lower lobe, suspicious for neoplasm. Extensive centrilobular emphysematous changes are present in the upper lobes. CT ABDOMEN AND PELVIS No acute traumatic findings. No peritoneal blood or free air. Liver, spleen, pancreas and  kidneys are unremarkable and intact. Aorta and IVC are normal in caliber, intact. There are adrenal masses bilaterally, measuring approximately 2 cm. There also is a 2.2 cm mass at the upper pole the right kidney which might represent a far posterior right adrenal nodule. The adrenals are poorly defined due to the marked paucity of surrounding fat. Bowel is unremarkable. Small volume ascites. No acute fracture is evident in the chest, abdomen or pelvis. There is a destructive bone lesion with associated soft tissue mass at the anterior aspect of the left femur greater trochanter, seen to best advantage on coronal images 76 series 304. This soft tissue component measures 1.9 x 2.5 cm. There also are lytic defects of the left femoral subcapital and intertrochanteric regions which are worrisome for risk of  pathologic fracture. IMPRESSION: 1. No evidence of acute traumatic injury in the chest, abdomen or pelvis. 2. 2.5 x 3.6 cm right lower lobe lung mass, suspicious for neoplasm. 3. Bilateral adrenal masses. There also is a 2.2 cm right suprarenal mass more posteriorly which may represent an additional adrenal mass. 4. Lytic lesions of the left femoral neck and intertrochanteric region, worrisome for risk for pathologic fracture. Electronically Signed   By: Andreas Newport M.D.   On: 12/06/2015 19:46   Ct Femur Left Wo Contrast  12/06/2015  CLINICAL DATA:  55 year old male with a history of fracture left femur EXAM: CT OF THE LOWER LEFT EXTREMITY WITHOUT CONTRAST TECHNIQUE: Multidetector CT imaging of the was performed according to the standard protocol. COMPARISON:  Plain film of today's date. Contemporaneously CT of the abdomen/pelvis FINDINGS: Transverse fracture in the mid femur with approximately 26 degrees of lateral angulation at the fracture site. There is medial displacement of the distal fracture fragment by 1/2 shaft width. Soft tissue somewhat limited by the exclusion of IV contrast, however, there is  abnormal soft tissue centered at the site of the fracture involving the type low ex base and expanding outside of the cortex circumferentially at the fracture site. Differentiating abnormal/pathologic soft tissue from hematoma is difficult. Permeative changes of the cortex at the fracture site with mild periosteal reaction and a wide zone of transition at the lesion. Lytic lesion at the greater trochanter, the femoral head neck junction, and the posterior femoral neck. Partially imaged right hydrocele versus lesion. IMPRESSION: Pathologic fracture at the mid left femoral diaphysis, with approximately 26 degrees of angulation at the fracture site, and permeative bone changes of the cortex. There is abnormal soft tissue within the medullary space centered at the fracture site, with circumferential soft tissue about the femur, and a wide zone of transition. The soft tissue may either represent a primary osteosarcoma, or metastatic disease. Additional lytic lesions of the femur involves the femoral neck junction, posterior femoral neck, and the greater trochanter, compatible with metastatic disease. Partially imaged right-sided hydrocele versus scrotal lesion. Recommend correlation with physical exam. Signed, Dulcy Fanny. Earleen Newport, DO [facility] Electronically Signed   By: Corrie Mckusick D.O.   On: 12/06/2015 19:46   Dg Femur Min 2 Views Left  12/06/2015  CLINICAL DATA:  Patient twisted leg with mid femur pain. Initial encounter. EXAM: LEFT FEMUR 2 VIEWS COMPARISON:  None. FINDINGS: There is a transverse displaced fracture through the mid femoral diaphysis. There is an irregular lytic lesion involving the mid femoral diaphysis at the fracture site with overlying periosteal reaction. Overlying soft tissue swelling. IMPRESSION: Displaced transverse fracture through the mid femoral diaphysis. Irregular lytic lesion and periosteal reaction involving the mid femoral  diaphysis at the fracture site is concerning for the possibility of metastatic disease or multiple myeloma. Electronically Signed   By: Lovey Newcomer M.D.   On: 12/06/2015 16:17   Medications: I have reviewed the patient's current medications. Scheduled Meds: . enoxaparin (LOVENOX) injection  40 mg Subcutaneous Q24H  . gelatin adsorbable      . HYDROmorphone   Intravenous Q4H  . ketorolac  15 mg Intravenous Q6H  . lidocaine (PF)      . midazolam       Continuous Infusions:  PRN Meds:.diphenhydrAMINE **OR** diphenhydrAMINE, naloxone **AND** sodium chloride flush, ondansetron (ZOFRAN) IV, promethazine Assessment/Plan: Active Problems:   Femur fracture (HCC)   Femur fracture, left (HCC)   Closed fracture of shaft  of left femur (HCC)   Swelling of right testicle   Fracture   Mass of soft tissue   Pain  Metastatic cancer, Likely primary lung cancer: Patient will need a CT guided biopsy of the several soft tissue masses. Consulted IR for the biopsy. Discussed with Oncology Dr Julien Nordmann who recommends that to be done first. He underwent biopsy this morning. Brain MRI was done yesterday which showed 4x2x2 cm mass adjacent to the left lateral pterygoid muscle, likely metastatic disease.   -CT guided biopsy results pending -Consulted Oncology- following and appreciate recs -Orthopedics following -follow up multiple myeloma panel, kappa lambda light chain, 24 hr urine IFE, serum urine electrophoresis   -chaplain support   -PCA pump for pain- dilaudid 1 mg q4 hours  -phenergan 25 mg q6 hours PRN   Pathologic left femur fracture: in the setting of metastatic cancer. Left femoral neck lytic lesion   -orthopedics following , on buck traction -Plan on surgery tomorrow afternoon    Anaemia of chronic disease- HgB stable and MCV is borderline high    Dispo: Disposition is deferred at this time, awaiting improvement of current medical problems.  Anticipated discharge in approximately 3 day(s).    The patient does have a current PCP (No Pcp Per Patient) and does not need an [facility] hospital follow-up appointment after discharge.  The patient does not have transportation limitations that hinder transportation to clinic appointments.  .Services Needed at time of discharge: Y = Yes, Blank = No PT:   OT:   RN:   Equipment:   Other:     LOS: 2 days   Burgess Estelle, MD 12/08/2015, 11:52 AM

## 2015-12-09 ENCOUNTER — Encounter (HOSPITAL_COMMUNITY): Payer: Self-pay | Admitting: Certified Registered Nurse Anesthetist

## 2015-12-09 ENCOUNTER — Inpatient Hospital Stay (HOSPITAL_COMMUNITY): Payer: BLUE CROSS/BLUE SHIELD | Admitting: Certified Registered Nurse Anesthetist

## 2015-12-09 ENCOUNTER — Inpatient Hospital Stay (HOSPITAL_COMMUNITY): Payer: BLUE CROSS/BLUE SHIELD

## 2015-12-09 ENCOUNTER — Encounter (HOSPITAL_COMMUNITY)
Admission: EM | Disposition: A | Discharge status: Rehabilitation Facility | Payer: Self-pay | Source: Home / Self Care | Attending: Internal Medicine

## 2015-12-09 HISTORY — PX: FEMUR IM NAIL: SHX1597

## 2015-12-09 LAB — BASIC METABOLIC PANEL
Anion gap: 11 (ref 5–15)
BUN: 16 mg/dL (ref 6–20)
CALCIUM: 8.2 mg/dL — AB (ref 8.9–10.3)
CO2: 26 mmol/L (ref 22–32)
CREATININE: 0.8 mg/dL (ref 0.61–1.24)
Chloride: 96 mmol/L — ABNORMAL LOW (ref 101–111)
GFR calc Af Amer: >60 mL/min (ref >60)
GLUCOSE: 97 mg/dL (ref 65–99)
Potassium: 4 mmol/L (ref 3.5–5.1)
SODIUM: 133 mmol/L — AB (ref 135–145)

## 2015-12-09 LAB — CBC
HCT: 31.4 % — ABNORMAL LOW (ref 39.0–52.0)
HEMOGLOBIN: 10.5 g/dL — AB (ref 13.0–17.0)
MCH: 34.1 pg — AB (ref 26.0–34.0)
MCHC: 33.4 g/dL (ref 30.0–36.0)
MCV: 101.9 fL — AB (ref 78.0–100.0)
PLATELETS: 290 10*3/uL (ref 150–400)
RBC: 3.08 MIL/uL — ABNORMAL LOW (ref 4.22–5.81)
RDW: 13.5 % (ref 11.5–15.5)
WBC: 5.4 10*3/uL (ref 4.0–10.5)

## 2015-12-09 LAB — SURGICAL PCR SCREEN
MRSA, PCR: NEGATIVE
Staphylococcus aureus: NEGATIVE

## 2015-12-09 SURGERY — INSERTION, INTRAMEDULLARY ROD, FEMUR
Anesthesia: General | Laterality: Left

## 2015-12-09 MED ORDER — METHOCARBAMOL 500 MG PO TABS
500.0000 mg | ORAL_TABLET | Freq: Four times a day (QID) | ORAL | Status: DC | PRN
  Administered 2015-12-10 – 2015-12-16 (×9): 500 mg via ORAL
  Filled 2015-12-09 (×10): qty 1

## 2015-12-09 MED ORDER — FENTANYL CITRATE (PF) 250 MCG/5ML IJ SOLN
INTRAMUSCULAR | Status: AC
  Filled 2015-12-09: qty 5

## 2015-12-09 MED ORDER — LACTATED RINGERS IV SOLN
INTRAVENOUS | Status: DC
  Administered 2015-12-09: 14:00:00 via INTRAVENOUS

## 2015-12-09 MED ORDER — HYDROMORPHONE 1 MG/ML IV SOLN
INTRAVENOUS | Status: DC
  Administered 2015-12-10: 2.45 mg via INTRAVENOUS
  Administered 2015-12-10: 2.8 mg via INTRAVENOUS
  Administered 2015-12-10: 2.45 mg via INTRAVENOUS
  Administered 2015-12-10: 4.9 mg via INTRAVENOUS
  Administered 2015-12-10: 1.4 mg via INTRAVENOUS
  Administered 2015-12-10: 2.8 mg via INTRAVENOUS
  Administered 2015-12-11: 1.75 mg via INTRAVENOUS
  Administered 2015-12-11: 1.05 mg via INTRAVENOUS
  Administered 2015-12-11: 01:00:00 via INTRAVENOUS
  Administered 2015-12-11: 2.45 mg via INTRAVENOUS
  Administered 2015-12-11: 3.5 mg via INTRAVENOUS
  Filled 2015-12-09 (×2): qty 25

## 2015-12-09 MED ORDER — NEOSTIGMINE METHYLSULFATE 10 MG/10ML IV SOLN
INTRAVENOUS | Status: DC | PRN
  Administered 2015-12-09: 3 mg via INTRAVENOUS

## 2015-12-09 MED ORDER — SODIUM CHLORIDE 0.9 % IV SOLN
INTRAVENOUS | Status: DC
  Administered 2015-12-09 – 2015-12-10 (×2): via INTRAVENOUS

## 2015-12-09 MED ORDER — DEXTROSE 5 % IV SOLN
500.0000 mg | Freq: Four times a day (QID) | INTRAVENOUS | Status: DC | PRN
  Administered 2015-12-09: 500 mg via INTRAVENOUS
  Filled 2015-12-09 (×2): qty 5

## 2015-12-09 MED ORDER — FENTANYL CITRATE (PF) 100 MCG/2ML IJ SOLN
INTRAMUSCULAR | Status: DC | PRN
  Administered 2015-12-09: 100 ug via INTRAVENOUS
  Administered 2015-12-09 (×3): 50 ug via INTRAVENOUS

## 2015-12-09 MED ORDER — HYDROMORPHONE HCL 1 MG/ML IJ SOLN
INTRAMUSCULAR | Status: AC
  Administered 2015-12-09: 0.5 mg via INTRAVENOUS
  Filled 2015-12-09: qty 1

## 2015-12-09 MED ORDER — MIDAZOLAM HCL 2 MG/2ML IJ SOLN
INTRAMUSCULAR | Status: AC
  Filled 2015-12-09: qty 2

## 2015-12-09 MED ORDER — ONDANSETRON HCL 4 MG/2ML IJ SOLN
INTRAMUSCULAR | Status: AC
  Filled 2015-12-09: qty 2

## 2015-12-09 MED ORDER — MENTHOL 3 MG MT LOZG: 1.0000 | LOZENGE | OROMUCOSAL | Status: DC | PRN

## 2015-12-09 MED ORDER — PROPOFOL 10 MG/ML IV BOLUS
INTRAVENOUS | Status: AC
  Filled 2015-12-09: qty 20

## 2015-12-09 MED ORDER — MIDAZOLAM HCL 5 MG/5ML IJ SOLN
INTRAMUSCULAR | Status: DC | PRN
  Administered 2015-12-09: 2 mg via INTRAVENOUS

## 2015-12-09 MED ORDER — CEFAZOLIN SODIUM-DEXTROSE 2-4 GM/100ML-% IV SOLN
2.0000 g | Freq: Four times a day (QID) | INTRAVENOUS | Status: AC
  Administered 2015-12-09 – 2015-12-10 (×2): 2 g via INTRAVENOUS
  Filled 2015-12-09 (×2): qty 100

## 2015-12-09 MED ORDER — 0.9 % SODIUM CHLORIDE (POUR BTL) OPTIME
TOPICAL | Status: DC | PRN
  Administered 2015-12-09: 1000 mL

## 2015-12-09 MED ORDER — CEFAZOLIN SODIUM-DEXTROSE 2-4 GM/100ML-% IV SOLN
INTRAVENOUS | Status: AC
  Filled 2015-12-09: qty 100

## 2015-12-09 MED ORDER — OXYCODONE HCL 5 MG PO TABS
5.0000 mg | ORAL_TABLET | ORAL | Status: DC | PRN
  Administered 2015-12-11 (×2): 10 mg via ORAL
  Filled 2015-12-09 (×2): qty 2

## 2015-12-09 MED ORDER — ONDANSETRON HCL 4 MG/2ML IJ SOLN: 4.0000 mg | Freq: Four times a day (QID) | INTRAMUSCULAR | Status: DC | PRN

## 2015-12-09 MED ORDER — ROCURONIUM BROMIDE 100 MG/10ML IV SOLN
INTRAVENOUS | Status: DC | PRN
  Administered 2015-12-09: 50 mg via INTRAVENOUS

## 2015-12-09 MED ORDER — ONDANSETRON HCL 4 MG PO TABS: 4.0000 mg | ORAL_TABLET | Freq: Four times a day (QID) | ORAL | Status: DC | PRN

## 2015-12-09 MED ORDER — ONDANSETRON HCL 4 MG/2ML IJ SOLN: 4.0000 mg | Freq: Once | INTRAMUSCULAR | Status: DC | PRN

## 2015-12-09 MED ORDER — METOCLOPRAMIDE HCL 5 MG/ML IJ SOLN: 5.0000 mg | Freq: Three times a day (TID) | INTRAMUSCULAR | Status: DC | PRN

## 2015-12-09 MED ORDER — ALUM & MAG HYDROXIDE-SIMETH 200-200-20 MG/5ML PO SUSP: 30.0000 mL | ORAL | Status: DC | PRN

## 2015-12-09 MED ORDER — METOCLOPRAMIDE HCL 5 MG PO TABS
5.0000 mg | ORAL_TABLET | Freq: Three times a day (TID) | ORAL | Status: DC | PRN
  Administered 2015-12-10: 10 mg via ORAL
  Filled 2015-12-09: qty 2

## 2015-12-09 MED ORDER — CEFAZOLIN SODIUM-DEXTROSE 2-4 GM/100ML-% IV SOLN
2.0000 g | Freq: Once | INTRAVENOUS | Status: AC
  Administered 2015-12-09: 2 g via INTRAVENOUS

## 2015-12-09 MED ORDER — GLYCOPYRROLATE 0.2 MG/ML IJ SOLN
INTRAMUSCULAR | Status: DC | PRN
  Administered 2015-12-09: 0.2 mg via INTRAVENOUS
  Administered 2015-12-09: 0.4 mg via INTRAVENOUS

## 2015-12-09 MED ORDER — ACETAMINOPHEN 325 MG PO TABS: 650.0000 mg | ORAL_TABLET | Freq: Four times a day (QID) | ORAL | Status: DC | PRN

## 2015-12-09 MED ORDER — PHENOL 1.4 % MT LIQD: 1.0000 | OROMUCOSAL | Status: DC | PRN

## 2015-12-09 MED ORDER — HYDROMORPHONE HCL 1 MG/ML IJ SOLN: 1.0000 mg | INTRAMUSCULAR | Status: DC | PRN

## 2015-12-09 MED ORDER — HYDROMORPHONE HCL 1 MG/ML IJ SOLN
0.2500 mg | INTRAMUSCULAR | Status: DC | PRN
  Administered 2015-12-09 (×4): 0.5 mg via INTRAVENOUS

## 2015-12-09 MED ORDER — DOCUSATE SODIUM 100 MG PO CAPS
100.0000 mg | ORAL_CAPSULE | Freq: Two times a day (BID) | ORAL | Status: DC
  Administered 2015-12-09 – 2015-12-16 (×14): 100 mg via ORAL
  Filled 2015-12-09 (×14): qty 1

## 2015-12-09 MED ORDER — ARTIFICIAL TEARS OP OINT
TOPICAL_OINTMENT | OPHTHALMIC | Status: AC
  Filled 2015-12-09: qty 3.5

## 2015-12-09 MED ORDER — ACETAMINOPHEN 650 MG RE SUPP: 650.0000 mg | Freq: Four times a day (QID) | RECTAL | Status: DC | PRN

## 2015-12-09 MED ORDER — ONDANSETRON HCL 4 MG/2ML IJ SOLN
INTRAMUSCULAR | Status: DC | PRN
  Administered 2015-12-09: 4 mg via INTRAVENOUS

## 2015-12-09 MED ORDER — LIDOCAINE HCL (CARDIAC) 20 MG/ML IV SOLN
INTRAVENOUS | Status: DC | PRN
  Administered 2015-12-09: 80 mg via INTRATRACHEAL
  Administered 2015-12-09: 60 mg via INTRAVENOUS

## 2015-12-09 MED ORDER — PROPOFOL 10 MG/ML IV BOLUS
INTRAVENOUS | Status: DC | PRN
  Administered 2015-12-09: 200 mg via INTRAVENOUS

## 2015-12-09 SURGICAL SUPPLY — 56 items
BIT DRILL SHORT 4.0 (BIT) ×1 IMPLANT
BNDG COHESIVE 4X5 TAN NS LF (GAUZE/BANDAGES/DRESSINGS) ×3 IMPLANT
CONT SPEC 4OZ CLIKSEAL STRL BL (MISCELLANEOUS) ×3 IMPLANT
COVER PERINEAL POST (MISCELLANEOUS) ×3 IMPLANT
COVER SURGICAL LIGHT HANDLE (MISCELLANEOUS) ×3 IMPLANT
DRAPE C-ARMOR (DRAPES) ×3 IMPLANT
DRAPE INCISE IOBAN 66X45 STRL (DRAPES) ×3 IMPLANT
DRAPE STERI IOBAN 125X83 (DRAPES) ×3 IMPLANT
DRESSING AQUACEL AG SP 3.5X4 (GAUZE/BANDAGES/DRESSINGS) ×1 IMPLANT
DRILL BIT SHORT 4.0 (BIT) ×2
DRSG AQUACEL AG ADV 3.5X 6 (GAUZE/BANDAGES/DRESSINGS) ×3 IMPLANT
DRSG AQUACEL AG SP 3.5X4 (GAUZE/BANDAGES/DRESSINGS) ×3
DRSG MEPILEX BORDER 4X4 (GAUZE/BANDAGES/DRESSINGS) ×3 IMPLANT
DRSG MEPILEX BORDER 4X8 (GAUZE/BANDAGES/DRESSINGS) ×3 IMPLANT
DRSG PAD ABDOMINAL 8X10 ST (GAUZE/BANDAGES/DRESSINGS) IMPLANT
DURAPREP 26ML APPLICATOR (WOUND CARE) ×3 IMPLANT
ELECT REM PT RETURN 9FT ADLT (ELECTROSURGICAL) ×3
ELECTRODE REM PT RTRN 9FT ADLT (ELECTROSURGICAL) ×1 IMPLANT
FACESHIELD WRAPAROUND (MASK) ×3 IMPLANT
GAUZE XEROFORM 5X9 LF (GAUZE/BANDAGES/DRESSINGS) ×3 IMPLANT
GLOVE BIO SURGEON STRL SZ8 (GLOVE) ×3 IMPLANT
GLOVE BIOGEL PI IND STRL 8 (GLOVE) ×1 IMPLANT
GLOVE BIOGEL PI INDICATOR 8 (GLOVE) ×2
GLOVE ORTHO TXT STRL SZ7.5 (GLOVE) ×3 IMPLANT
GOWN STRL REUS W/ TWL LRG LVL3 (GOWN DISPOSABLE) ×1 IMPLANT
GOWN STRL REUS W/ TWL XL LVL3 (GOWN DISPOSABLE) ×2 IMPLANT
GOWN STRL REUS W/TWL LRG LVL3 (GOWN DISPOSABLE) ×2
GOWN STRL REUS W/TWL XL LVL3 (GOWN DISPOSABLE) ×4
GUIDE PIN 3.2X343 (PIN) ×1
GUIDE PIN 3.2X343MM (PIN) ×2
GUIDE ROD 3.0 (MISCELLANEOUS) ×3
KIT BASIN OR (CUSTOM PROCEDURE TRAY) ×3 IMPLANT
KIT ROOM TURNOVER OR (KITS) ×3 IMPLANT
LINER BOOT UNIVERSAL DISP (MISCELLANEOUS) ×3 IMPLANT
MANIFOLD NEPTUNE II (INSTRUMENTS) ×3 IMPLANT
NAIL TRIGEN LEFT 11.5X38-125 (Nail) ×3 IMPLANT
NS IRRIG 1000ML POUR BTL (IV SOLUTION) ×3 IMPLANT
PACK GENERAL/GYN (CUSTOM PROCEDURE TRAY) ×3 IMPLANT
PAD ARMBOARD 7.5X6 YLW CONV (MISCELLANEOUS) ×6 IMPLANT
PAD CAST 4YDX4 CTTN HI CHSV (CAST SUPPLIES) IMPLANT
PADDING CAST COTTON 4X4 STRL (CAST SUPPLIES)
PIN GUIDE 3.2X343MM (PIN) ×1 IMPLANT
ROD GUIDE 3.0 (MISCELLANEOUS) ×1 IMPLANT
SCREW LAG COMPR KIT 95/90 (Screw) ×3 IMPLANT
SCREW TRIGEN LOW PROF 5.0X37.5 (Screw) ×3 IMPLANT
SCREW TRIGEN LOW PROF 5.0X40 (Screw) ×3 IMPLANT
STAPLER VISISTAT 35W (STAPLE) ×3 IMPLANT
SUT VIC AB 0 CT1 27 (SUTURE) ×4
SUT VIC AB 0 CT1 27XBRD ANBCTR (SUTURE) ×2 IMPLANT
SUT VIC AB 1 CT1 27 (SUTURE) ×2
SUT VIC AB 1 CT1 27XBRD ANBCTR (SUTURE) ×1 IMPLANT
SUT VIC AB 2-0 CT1 27 (SUTURE) ×4
SUT VIC AB 2-0 CT1 TAPERPNT 27 (SUTURE) ×2 IMPLANT
TOWEL OR 17X24 6PK STRL BLUE (TOWEL DISPOSABLE) ×3 IMPLANT
TOWEL OR 17X26 10 PK STRL BLUE (TOWEL DISPOSABLE) ×3 IMPLANT
WATER STERILE IRR 1000ML POUR (IV SOLUTION) IMPLANT

## 2015-12-09 NOTE — Anesthesia Preprocedure Evaluation (Addendum)
Anesthesia Evaluation  Patient identified by MRN, date of birth, ID band Patient awake    Reviewed: Allergy & Precautions, H&P , NPO status , Patient's Chart, lab work & pertinent test results  History of Anesthesia Complications Negative for: history of anesthetic complications  Airway Mallampati: II  TM Distance: >3 FB Neck ROM: full    Dental no notable dental hx.    Pulmonary Current Smoker,    Pulmonary exam normal breath sounds clear to auscultation       Cardiovascular negative cardio ROS Normal cardiovascular exam Rhythm:regular Rate:Normal     Neuro/Psych negative neurological ROS     GI/Hepatic negative GI ROS, Neg liver ROS,   Endo/Other  negative endocrine ROS  Renal/GU negative Renal ROS     Musculoskeletal   Abdominal   Peds  Hematology negative hematology ROS (+)   Anesthesia Other Findings   Reproductive/Obstetrics negative OB ROS                             Anesthesia Physical Anesthesia Plan  ASA: III  Anesthesia Plan: General   Post-op Pain Management:    Induction: Intravenous  Airway Management Planned: Oral ETT  Additional Equipment:   Intra-op Plan:   Post-operative Plan:   Informed Consent: I have reviewed the patients History and Physical, chart, labs and discussed the procedure including the risks, benefits and alternatives for the proposed anesthesia with the patient or authorized representative who has indicated his/her understanding and acceptance.   Dental Advisory Given  Plan Discussed with: Anesthesiologist, CRNA and Surgeon  Anesthesia Plan Comments:        Anesthesia Quick Evaluation

## 2015-12-09 NOTE — Progress Notes (Signed)
Subjective:  No acute events overnight.  Underwent CT guided biopsy yesterday. Results pending  Denies any pain. Site looks intact.  Will go to OR today for orthopaedics      Objective: Vital signs in last 24 hours: Filed Vitals:   12/08/15 2059 12/08/15 2137 12/09/15 0357 12/09/15 0801  BP: 133/80  116/59   Pulse: 87  69   Temp: 98.4 F (36.9 C)  98.7 F (37.1 C)   TempSrc: Oral  Oral   Resp: _0 Height:      Weight:      SpO2: 99% 99% 100% 99%   Weight change:   Intake/Output Summary (Last 24 hours) at 12/09/15 0841 Last data filed at 12/09/15 0800  Gross per 24 hour  Intake    480 ml  Output    725 ml  Net   -245 ml    General: Vital signs reviewed. Patient in NAD. Cardiovascular: regular rate, rhythm, no murmur appreciated  Pulmonary/Chest: CTAB no wheezing Back: has a 10x10 cm nontender area at the left posterior axillary line lateral and inferior to left scapula  Abdominal: Soft, non-tender, non-distended, BS + Extremities:  Has Buck traction on left lower extremity. Knee area is still swollen Skin: Warm, dry and intact. No rashes or erythema.     Lab Results: Results for orders placed or performed during the hospital encounter of 12/06/15 (from the past 24 hour(s))  Surgical pcr screen     Status: None   Collection Time: 12/08/15  9:45 PM  Result Value Ref Range   MRSA, PCR NEGATIVE NEGATIVE   Staphylococcus aureus NEGATIVE NEGATIVE  CBC     Status: Abnormal   Collection Time: 12/09/15  7:14 AM  Result Value Ref Range   WBC 5.4 4.0 - 10.5 K/uL   RBC 3.08 (L) 4.22 - 5.81 MIL/uL   Hemoglobin 10.5 (L) 13.0 - 17.0 g/dL   HCT 31.4 (L) 39.0 - 52.0 %   MCV 101.9 (H) 78.0 - 100.0 fL   MCH 34.1 (H) 26.0 - 34.0 pg   MCHC 33.4 30.0 - 36.0 g/dL   RDW 13.5 11.5 - 15.5 %   Platelets 290 150 - 400 K/uL  Basic metabolic panel     Status: Abnormal   Collection Time: 12/09/15  7:14 AM  Result Value Ref Range   Sodium 133 (L) 135 - 145 mmol/L     Potassium 4.0 3.5 - 5.1 mmol/L   Chloride 96 (L) 101 - 111 mmol/L   CO2 26 22 - 32 mmol/L   Glucose, Bld 97 65 - 99 mg/dL   BUN 16 6 - 20 mg/dL   Creatinine, Ser 0.80 0.61 - 1.24 mg/dL   Calcium 8.2 (L) 8.9 - 10.3 mg/dL   GFR calc non Af Amer >60 >60 mL/min   GFR calc Af Amer >60 >60 mL/min   Anion gap 11 5 - 15    Micro Results: Recent Results (from the past 240 hour(s))  Surgical pcr screen     Status: None   Collection Time: 12/08/15  9:45 PM  Result Value Ref Range Status   MRSA, PCR NEGATIVE NEGATIVE Final   Staphylococcus aureus NEGATIVE NEGATIVE Final    Comment:        The Xpert SA Assay (FDA approved for NASAL specimens in patients over 67 years of age), is one component of a comprehensive surveillance program.  Test performance has been validated by [facility] for patients greater than or  equal to 6 year old. It is not intended to diagnose infection nor to guide or monitor treatment.    Studies/Results: Mr Kizzie Fantasia Contrast  12/07/2015  CLINICAL DATA:  55 year old male with pathologic fracture left femur. Lung lesion. Evaluate for intracranial metastatic disease. Initial encounter. EXAM: MRI HEAD WITHOUT AND WITH CONTRAST TECHNIQUE: Multiplanar, multiecho pulse sequences of the brain and surrounding structures were obtained without and with intravenous contrast. CONTRAST:  3m MULTIHANCE GADOBENATE DIMEGLUMINE 529 MG/ML IV SOLN COMPARISON:  None. FINDINGS: Exam is motion degraded. In particular, post-contrast sequences are significantly motion degraded limiting evaluation for detection of subtle abnormality. No intracranial enhancing lesion detected. 4.1 x 2.6 x 2.2 cm mass adjacent to the left lateral pterygoid muscle. This is suspicious for possibility of metastatic disease. Given the motion degradation and this finding, contrast-enhanced CT of the head and neck recommended for further delineation. No acute infarct or intracranial hemorrhage. Major intracranial  vascular structures appear patent. Abnormal signal intensity bone marrow upper cervical spine raises possibility of infiltration by tumor or result of underlying anemia. Slightly heterogeneous bone marrow of the calvarium. Attention to the calvarium on CT bone windows. IMPRESSION: Post-contrast sequences are significantly motion degraded limiting evaluation for detection of subtle abnormality. No intracranial enhancing lesion detected. 4.1 x 2.6 x 2.2 cm mass adjacent to the left lateral pterygoid muscle. This is suspicious for possibility of metastatic disease. Given the motion degradation and this finding, contrast-enhanced CT of the head and neck recommended for further delineation. Abnormal signal intensity of bone marrow of the upper cervical spine raises possibility of infiltration by tumor or result of underlying anemia. Slightly heterogeneous bone marrow of the calvarium. Attention to the calvarium on CT bone windows to evaluate for possibility of myeloma. Electronically Signed   By: SGenia DelM.D.   On: 12/07/2015 13:26   [place]  12/08/2015  INDICATION: Left chest wall palpable mass, right lower lobe mass, concern for metastatic disease EXAM: ULTRASOUND GUIDED LEFT CHEST WALL SOFT TISSUE MASS BIOPSY MEDICATIONS: 1% LIDOCAINE LOCALLY ANESTHESIA/SEDATION: Versed 1.5 mg IV Moderate Sedation Time:  10 minutes The patient was continuously monitored during the procedure by the interventional radiology nurse under my direct supervision. PROCEDURE: The procedure, risks, benefits, and alternatives were explained to the patient. Questions regarding the procedure were encouraged and answered. The patient understands and consents to the procedure. The left chest wall soft tissue mass was prepped with ChloraPrep in a sterile fashion, and a sterile drape was applied covering the operative field. A sterile gown and sterile gloves were used for the procedure. Local anesthesia was provided with 1% Lidocaine.  Previous imaging reviewed. Preliminary ultrasound performed. The left chest wall soft tissue mass was imaged with ultrasound. Overlying skin was marked. Under sterile conditions and local anesthesia, a 17 gauge coaxial needle was advanced into the lesion under direct ultrasound. Images obtained for documentation. 3 18 gauge core biopsies obtained through the access. Needle tract embolized with Gel-Foam. Samples were placed in formalin. No immediate complication. Patient tolerated the procedure well. COMPLICATIONS: None immediate. FINDINGS: Imaging confirms needle placed in in the left chest wall mass for core biopsy IMPRESSION: Successful ultrasound left chest wall soft tissue mass 18 gauge core biopsy Electronically Signed   By: MJerilynn Mages  Shick M.D.   On: 12/08/2015 12:33   Medications: I have reviewed the patient's current medications. Scheduled Meds: . enoxaparin (LOVENOX) injection  40 mg Subcutaneous Q24H  . HYDROmorphone   Intravenous Q4H  . 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 Brain MRI done on Sunday.  No M spike observed on protein electrophoresis, so unlikely to be MM. Kappa free chain 29 and lambda chain of 47.  -CT guided biopsy results pending -Consulted Oncology- following and appreciate recs -Orthopedics following -follow up multiple myeloma panel, 24 hr urine IFE   -chaplain support   -PCA pump for pain- dilaudid 1 mg q4 hours    -phenergan 25 mg q6 hours PRN   Pathologic left femur fracture: in the setting of metastatic cancer. Left femoral neck lytic lesion   -orthopedics following , on buck traction -Plan on surgery today -lovenox 40 mg after surgery    Anaemia of chronic disease- HgB stable and MCV is borderline high    DVT Ppx: Pt is on lovenox 40 mg daily  Diet- has been NPO  Dispo: Disposition is deferred at this time, awaiting improvement of current medical problems.  Anticipated discharge in approximately 3 day(s).   The patient does have a current PCP (No Pcp Per Patient) and does not need an [facility] hospital follow-up appointment after discharge.  The patient does not have transportation limitations that hinder transportation to clinic appointments.  .Services Needed at time of discharge: Y = Yes, Blank = No PT:   OT:   RN:   Equipment:   Other:     LOS: 3 days   Burgess Estelle, MD 12/09/2015, 8:41 AM

## 2015-12-09 NOTE — Progress Notes (Signed)
Patient ID: Marvin Hardy, male   DOB: [birth year], 55 y.o.   MRN: [id number] Marvin Hardy understand fully that we are proceeding to the OR today to stabilize his pathologic left femur shaft fractures.  We will attempt to obtain tissue for pathologic identification in the same setting.  The risks and benefits have been discussed in detail.

## 2015-12-09 NOTE — Progress Notes (Signed)
Subjective: Marvin Hardy has had no acute events overnight. He says that his pain has been difficult overnight, but the pain medication is helping.  Objective: Vital signs in last 24 hours: Filed Vitals:   12/08/15 2137 12/09/15 0357 12/09/15 0801 12/09/15 1125  BP:  116/59    Pulse:  69    Temp:  98.7 F (37.1 C)    TempSrc:  Oral    Resp: _0 Height:      Weight:      SpO2: 99% 100% 99% 97%   Weight change:   Intake/Output Summary (Last 24 hours) at 12/09/15 1145 Last data filed at 12/09/15 0900  Gross per 24 hour  Intake    480 ml  Output    725 ml  Net   -245 ml   Physical exam: BP 116/59 mmHg  Pulse 69  Temp(Src) 98.7 F (37.1 C) (Oral)  Resp 11  Ht _1  (1.981 m)  Wt 73.211 kg (161 lb 6.4 oz)  BMI 18.66 kg/m2  SpO2 97% General appearance: alert and cooperative Lungs: clear to auscultation bilaterally Chest wall: no tenderness, fixed, nontender mass on lateral left chest wall  Heart: regular rate and rhythm, S1, S2 normal, no murmur, click, rub or gallop  Lab Results: CBC    Component Value Date/Time   WBC 5.4 12/09/2015 0714   RBC 3.08* 12/09/2015 0714   RBC 3.45* 12/07/2015 0319   HGB 10.5* 12/09/2015 0714   HCT 31.4* 12/09/2015 0714   PLT 290 12/09/2015 0714   MCV 101.9* 12/09/2015 0714   MCH 34.1* 12/09/2015 0714   MCHC 33.4 12/09/2015 0714   RDW 13.5 12/09/2015 0714   BMP Latest Ref Rng 12/09/2015 12/08/2015 12/07/2015  Glucose 65 - 99 mg/dL 97 94 93  BUN 6 - 20 mg/dL _2 Creatinine 0.61 - 1.24 mg/dL 0.80 0.67 0.58(L)  Sodium 135 - 145 mmol/L 133(L) 134(L) 135  Potassium 3.5 - 5.1 mmol/L 4.0 3.9 3.4(L)  Chloride 101 - 111 mmol/L 96(L) 100(L) 101  CO2 22 - 32 mmol/L _3 Calcium 8.9 - 10.3 mg/dL 8.2(L) 8.2(L) 8.4(L)    Micro Results: Recent Results (from the past 240 hour(s))  Surgical pcr screen     Status: None   Collection Time: 12/08/15  9:45 PM  Result Value Ref Range Status   MRSA, PCR NEGATIVE NEGATIVE Final   Staphylococcus aureus NEGATIVE NEGATIVE Final    Comment:        The Xpert SA Assay (FDA approved for NASAL specimens in patients over 65 years of age), is one component of a comprehensive surveillance program.  Test performance has been validated by [facility] for patients greater than or equal to 61 year old. It is not intended to diagnose infection nor to guide or monitor treatment.    Studies/Results: Marvin Hardy Contrast  12/07/2015  CLINICAL DATA:  55 year old male with pathologic fracture left femur. Lung lesion. Evaluate for intracranial metastatic disease. Initial encounter. EXAM: MRI HEAD WITHOUT AND WITH CONTRAST TECHNIQUE: Multiplanar, multiecho pulse sequences of the brain and surrounding structures were obtained without and with intravenous contrast. CONTRAST:  72m MULTIHANCE GADOBENATE DIMEGLUMINE 529 MG/ML IV SOLN COMPARISON:  None. FINDINGS: Exam is motion degraded. In particular, post-contrast sequences are significantly motion degraded limiting evaluation for detection of subtle abnormality. No intracranial enhancing lesion detected. 4.1 x 2.6 x 2.2 cm mass adjacent to the left lateral pterygoid muscle. This is suspicious for possibility of  metastatic disease. Given the motion degradation and this finding, contrast-enhanced CT of the head and neck recommended for further delineation. No acute infarct or intracranial hemorrhage. Major intracranial vascular structures appear patent. Abnormal signal intensity bone marrow upper cervical spine raises possibility of infiltration by tumor or result of underlying anemia. Slightly heterogeneous bone marrow of the calvarium. Attention to the calvarium on CT bone windows. IMPRESSION: Post-contrast sequences are significantly motion degraded limiting evaluation for detection of subtle abnormality. No intracranial enhancing lesion detected. 4.1 x 2.6 x 2.2 cm mass adjacent to the left lateral pterygoid muscle. This is suspicious for  possibility of metastatic disease. Given the motion degradation and this finding, contrast-enhanced CT of the head and neck recommended for further delineation. Abnormal signal intensity of bone marrow of the upper cervical spine raises possibility of infiltration by tumor or result of underlying anemia. Slightly heterogeneous bone marrow of the calvarium. Attention to the calvarium on CT bone windows to evaluate for possibility of myeloma. Electronically Signed   By: Genia Del M.D.   On: 12/07/2015 13:26   US Biopsy  12/08/2015  INDICATION: Left chest wall palpable mass, right lower lobe mass, concern for metastatic disease EXAM: ULTRASOUND GUIDED LEFT CHEST WALL SOFT TISSUE MASS BIOPSY MEDICATIONS: 1% LIDOCAINE LOCALLY ANESTHESIA/SEDATION: Versed 1.5 mg IV Moderate Sedation Time:  10 minutes The patient was continuously monitored during the procedure by the interventional radiology nurse under my direct supervision. PROCEDURE: The procedure, risks, benefits, and alternatives were explained to the patient. Questions regarding the procedure were encouraged and answered. The patient understands and consents to the procedure. The left chest wall soft tissue mass was prepped with ChloraPrep in a sterile fashion, and a sterile drape was applied covering the operative field. A sterile gown and sterile gloves were used for the procedure. Local anesthesia was provided with 1% Lidocaine. Previous imaging reviewed. Preliminary ultrasound performed. The left chest wall soft tissue mass was imaged with ultrasound. Overlying skin was marked. Under sterile conditions and local anesthesia, a 17 gauge coaxial needle was advanced into the lesion under direct ultrasound. Images obtained for documentation. 3 18 gauge core biopsies obtained through the access. Needle tract embolized with Gel-Foam. Samples were placed in formalin. No immediate complication. Patient tolerated the procedure well. COMPLICATIONS: None immediate.  FINDINGS: Imaging confirms needle placed in in the left chest wall mass for core biopsy IMPRESSION: Successful ultrasound left chest wall soft tissue mass 18 gauge core biopsy Electronically Signed   By: Jerilynn Mages.  Shick M.D.   On: 12/08/2015 12:33   Medications: I have reviewed the patient's current medications. Scheduled Meds: . enoxaparin (LOVENOX) injection  40 mg Subcutaneous Q24H  . HYDROmorphone   Intravenous Q4H  . ketorolac  15 mg Intravenous Q6H   Continuous Infusions:  PRN Meds:.diphenhydrAMINE **OR** diphenhydrAMINE, naloxone **AND** sodium chloride flush, ondansetron (ZOFRAN) IV, promethazine Assessment/Plan: Assessment/Plan: Marvin Hardy is a 55 yo previously healthy man who presented to the ED with a pathological fracture of his left femur who was found to have multiple tumors involving the left femur, bilateral adrenals and left axillary chest wall.   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 Xray showed transverse displacement to the mid femoral diaphysis with irregular lytic lesions and periosteal reaction involving the mid femoral diaphysis at the fracture site. This is concerning for either a primary bone cancer or metastatic disease. CT of  the femur showed a pathologic fracture at the mid femoral diaphysis with permeative bone changes in the cortex. Additionally, there was abnormal soft tissue within the medullary space that extended into the fracture site. This is concerning for an osteosarcoma or metastatic disease. CT of chest, abdomen and pelvis revealed a right lower lung mass, possibly tumor. There were also bilateral adrenal masses found. There was an additional suprarenal mass found. On exam, a large, firm, nontender, fixated mass was found on the left chest wall near the axillary line that has been present for about 3 months.  -f/u on US  guided biopsy of lateral chest wall mass  -OR today for stabilization of left femoral shaft fracture -appreciate oncology recs  -multiple myeloma protein electrophoresis negative -pain management: PCA dilaudid, toradol 39m q6 IV   Right swollen testicle  Right testicle swollen on exam. CT of femur showing right-sided hydrocele versus scrotal lesion. PSA is 0.86. -scrotal ultrasound showed hydrocele   T wave inversion in V3 EKG showing NSR with T wave inversion in V3. Patient is symptomatic- no CP, dizziness, or dyspnea. -sinus tahycardia with unchanged T wave changes consistent with hx of STEMI   Macrocytic anemia H+H 10.5/31.4 and MCV 101.9 -continue following  This is a MCareers information officerNote.  The care of the patient was discussed with Dr. STiburcio Peaand the assessment and plan formulated with their assistance.  Please see their attached note for official documentation of the daily encounter.   LOS: 3 days   LWandra Mannan Med Student 12/09/2015, 11:45 AM

## 2015-12-09 NOTE — Anesthesia Postprocedure Evaluation (Signed)
Anesthesia Post Note  Patient: Marvin Hardy  Procedure(s) Performed: Procedure(s) (LRB): INTRAMEDULLARY (IM) NAIL LEFT FEMUR (Left)  Patient location during evaluation: PACU Anesthesia Type: General Level of consciousness: awake and alert Pain management: pain level controlled Vital Signs Assessment: post-procedure vital signs reviewed and stable Respiratory status: spontaneous breathing, nonlabored ventilation, respiratory function stable and patient connected to nasal cannula oxygen Cardiovascular status: blood pressure returned to baseline and stable Postop Assessment: no signs of nausea or vomiting Anesthetic complications: no    Last Vitals:  Filed Vitals:   12/09/15 1125 12/09/15 1300  BP:  114/59  Pulse:  59  Temp:  36.9 C  Resp: 11 16    Last Pain:  Filed Vitals:   12/09/15 1325  PainSc: 7                  Zenaida Deed

## 2015-12-09 NOTE — Brief Op Note (Signed)
12/06/2015 - 12/09/2015  3:53 PM  PATIENT:  Marvin Hardy  55 y.o. male  PRE-OPERATIVE DIAGNOSIS:  pathologic fracture left femur  POST-OPERATIVE DIAGNOSIS:  pathologic fracture left femur  PROCEDURE:  Procedure(s): INTRAMEDULLARY (IM) NAIL LEFT FEMUR (Left)  SURGEON:  Surgeon(s) and Role:    * Mcarthur Rossetti, MD - Primary  PHYSICIAN ASSISTANT: Benita Stabile, PA-C  ANESTHESIA:   general  EBL:  Total I/O In: 0  Out: 400 [Urine:150; Blood:250]  SPECIMEN:  Source of Specimen:  femur reamings  DISPOSITION OF SPECIMEN:  PATHOLOGY  COUNTS:  YES  TOURNIQUET:  * No tourniquets in log *  DICTATION: .Other Dictation: Dictation Number [id number]  PLAN OF CARE: Admit to inpatient   PATIENT DISPOSITION:  PACU - hemodynamically stable.   Delay start of Pharmacological VTE agent (>24hrs) due to surgical blood loss or risk of bleeding: no

## 2015-12-09 NOTE — Anesthesia Procedure Notes (Signed)
Procedure Name: Intubation Date/Time: 12/09/2015 2:21 PM Performed by: [place] Pink Pre-anesthesia Checklist: Patient identified, Suction available, Patient being monitored and Emergency Drugs available Patient Re-evaluated:Patient Re-evaluated prior to inductionOxygen Delivery Method: Circle system utilized Preoxygenation: Pre-oxygenation with 100% oxygen Intubation Type: IV induction Ventilation: Mask ventilation without difficulty Laryngoscope Size: Mac and 4 Grade View: Grade I Tube type: Oral Tube size: 7.5 mm Number of attempts: 1 Airway Equipment and Method: Stylet and LTA kit utilized Placement Confirmation: ETT inserted through vocal cords under direct vision,  positive ETCO2 and breath sounds checked- equal and bilateral Secured at: 22 cm Tube secured with: Tape Dental Injury: Teeth and Oropharynx as per pre-operative assessment

## 2015-12-09 NOTE — Transfer of Care (Signed)
Immediate Anesthesia Transfer of Care Note  Patient: Marvin Hardy  Procedure(s) Performed: Procedure(s): INTRAMEDULLARY (IM) NAIL LEFT FEMUR (Left)  Patient Location: PACU  Anesthesia Type:General  Level of Consciousness: awake, alert  and oriented  Airway & Oxygen Therapy: Patient Spontanous Breathing and Patient connected to nasal cannula oxygen  Post-op Assessment: Report given to RN and Post -op Vital signs reviewed and stable  Post vital signs: Reviewed and stable  Last Vitals:  Filed Vitals:   12/09/15 1125 12/09/15 1300  BP:  114/59  Pulse:  59  Temp:  36.9 C  Resp: 11 16    Last Pain:  Filed Vitals:   12/09/15 1325  PainSc: 7          Complications: No apparent anesthesia complications

## 2015-12-10 ENCOUNTER — Encounter (HOSPITAL_COMMUNITY): Payer: Self-pay | Admitting: Orthopaedic Surgery

## 2015-12-10 LAB — MULTIPLE MYELOMA PANEL, SERUM
ALBUMIN SERPL ELPH-MCNC: 2.9 g/dL (ref 2.9–4.4)
ALBUMIN/GLOB SERPL: 1 (ref 0.7–1.7)
Alpha 1: 0.3 g/dL (ref 0.0–0.4)
Alpha2 Glob SerPl Elph-Mcnc: 0.8 g/dL (ref 0.4–1.0)
B-GLOBULIN SERPL ELPH-MCNC: 1 g/dL (ref 0.7–1.3)
GAMMA GLOB SERPL ELPH-MCNC: 1.1 g/dL (ref 0.4–1.8)
GLOBULIN, TOTAL: 3.2 g/dL (ref 2.2–3.9)
IgA: 303 mg/dL (ref 90–386)
IgG (Immunoglobin G), Serum: 1086 mg/dL (ref 700–1600)
IgM, Serum: 65 mg/dL (ref 20–172)
Total Protein ELP: 6.1 g/dL (ref 6.0–8.5)

## 2015-12-10 LAB — BASIC METABOLIC PANEL
ANION GAP: 10 (ref 5–15)
BUN: 11 mg/dL (ref 6–20)
CHLORIDE: 97 mmol/L — AB (ref 101–111)
CO2: 26 mmol/L (ref 22–32)
CREATININE: 0.66 mg/dL (ref 0.61–1.24)
Calcium: 8.1 mg/dL — ABNORMAL LOW (ref 8.9–10.3)
GFR calc non Af Amer: >60 mL/min (ref >60)
Glucose, Bld: 127 mg/dL — ABNORMAL HIGH (ref 65–99)
POTASSIUM: 3.9 mmol/L (ref 3.5–5.1)
SODIUM: 133 mmol/L — AB (ref 135–145)

## 2015-12-10 LAB — UIFE/LIGHT CHAINS/TP QN, 24-HR UR
% BETA, URINE: 18.6 %
ALBUMIN, U: 54.2 %
ALPHA 1 URINE: 2.9 %
ALPHA 2 UR: 10.6 %
FREE KAPPA/LAMBDA RATIO: 5.06 (ref 2.04–10.37)
FREE LT CHN EXCR RATE: 263 mg/L — AB (ref 1.35–24.19)
Free Lambda Lt Chains,Ur: 52 mg/L — ABNORMAL HIGH (ref 0.24–6.66)
GAMMA GLOBULIN URINE: 13.7 %
M-SPIKE %, URINE: 6.1 % — AB
Total Protein, Urine: 19.1 mg/dL

## 2015-12-10 LAB — CBC
HEMATOCRIT: 28.8 % — AB (ref 39.0–52.0)
HEMOGLOBIN: 9.4 g/dL — AB (ref 13.0–17.0)
MCH: 32.4 pg (ref 26.0–34.0)
MCHC: 32.6 g/dL (ref 30.0–36.0)
MCV: 99.3 fL (ref 78.0–100.0)
Platelets: 316 10*3/uL (ref 150–400)
RBC: 2.9 MIL/uL — ABNORMAL LOW (ref 4.22–5.81)
RDW: 13 % (ref 11.5–15.5)
WBC: 6.5 10*3/uL (ref 4.0–10.5)

## 2015-12-10 NOTE — Evaluation (Signed)
Physical Therapy Evaluation Patient Details Name: Marvin Hardy MRN: [id number] DOB: [birth year] Today's Date: 12/10/2015   History of Present Illness  Pt is a 55 yo M with no significant past medical history presenting to the hospital with pain in his left lower extremity. Patient states he was at work and twisted his left leg while trying to turn while walking down a step. States he heard his "bone pop" when this happened. Denies having any history of fractures in the past. 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 He is now s/p IM nailing and is WBAT.  Clinical Impression  Pt admitted with above diagnosis. Pt currently with functional limitations due to the deficits listed below (see PT Problem List). At the time of PT eval pt was able to perform transfers with increased time and effort due to pain. Pt was encouraged to use PCA throughout session. Pt required increased encouragement to participate and achieved standing EOB with +2 mod assist. Pt will benefit from skilled PT to increase their independence and safety with mobility to allow discharge to the venue listed below.     Follow Up Recommendations CIR;Supervision/Assistance - 24 hour    Equipment Recommendations  Rolling walker with 5" wheels;3in1 (PT)    Recommendations for Other Services Rehab consult     Precautions / Restrictions Precautions Precautions: Fall Restrictions Weight Bearing Restrictions: Yes LLE Weight Bearing: Weight bearing as tolerated      Mobility  Bed Mobility Overal bed mobility: Needs Assistance Bed Mobility: Supine to Sit     Supine to sit: Mod assist     General bed mobility comments: Assist for LLE mobility. Pt requires increased time and many rest breaks to complete transition to EOB.   Transfers Overall transfer level: Needs assistance Equipment used: Rolling walker (2 wheeled) Transfers: Sit to/from Stand Sit to Stand: Mod  assist;+2 physical assistance;From elevated surface         General transfer comment: Increased time and +2 assist required to power-up to full standing position. Pt with increased pain which was main limiting factor.   Ambulation/Gait             General Gait Details: Unable to take steps. Pt participated in minimal pre-gait activity while standing EOB.   Stairs            Wheelchair Mobility    Modified Rankin (Stroke Patients Only)       Balance Overall balance assessment: Needs assistance Sitting-balance support: Feet supported;No upper extremity supported Sitting balance-Leahy Scale: Poor Sitting balance - Comments: Due to pain Postural control: Posterior lean;Right lateral lean Standing balance support: Bilateral upper extremity supported;During functional activity Standing balance-Leahy Scale: Poor Standing balance comment: Requires UE support to maintain standing balance.                              Pertinent Vitals/Pain Pain Assessment: 0-10 Pain Score: 10-Worst pain ever Pain Location: hip Pain Descriptors / Indicators: Operative site guarding;Sharp Pain Intervention(s): Limited activity within patient's tolerance;Monitored during session;Repositioned;PCA encouraged    Home Living Family/patient expects to be discharged to:: Private residence Living Arrangements: Non-relatives/Friends Available Help at Discharge: Family;Available PRN/intermittently Type of Home: House Home Access:  (Unsure)       Home Equipment: Kasandra Knudsen - single point Additional Comments: Pt reports his roommate has found a new place to live for them and he does know currently know  any details of the living situation he will be going back to.     Prior Function Level of Independence: Independent with assistive device(s)         Comments: Used the cane all the time.      Hand Dominance   Dominant Hand: Right    Extremity/Trunk Assessment   Upper Extremity  Assessment: Defer to OT evaluation           Lower Extremity Assessment: LLE deficits/detail   LLE Deficits / Details: Acute pain and decreased strength/AROM consistent with above mentioned procedure  Cervical / Trunk Assessment: Normal  Communication   Communication: No difficulties  Cognition Arousal/Alertness: Awake/alert Behavior During Therapy: WFL for tasks assessed/performed Overall Cognitive Status: Within Functional Limits for tasks assessed                      General Comments      Exercises        Assessment/Plan    PT Assessment Patient needs continued PT services  PT Diagnosis Difficulty walking;Acute pain   PT Problem List Decreased strength;Decreased range of motion;Decreased activity tolerance;Decreased balance;Decreased mobility;Decreased knowledge of use of DME;Decreased safety awareness;Decreased knowledge of precautions;Pain  PT Treatment Interventions DME instruction;Stair training;Gait training;Functional mobility training;Therapeutic activities;Therapeutic exercise;Neuromuscular re-education;Patient/family education   PT Goals (Current goals can be found in the Care Plan section) Acute Rehab PT Goals Patient Stated Goal: Decrease pain PT Goal Formulation: With patient Time For Goal Achievement: 12/17/15 Potential to Achieve Goals: Good    Frequency Min 5X/week   Barriers to discharge Decreased caregiver support;Inaccessible home environment Unclear what kind of living situation pt will be returning to, or who will be available to assist him.     Co-evaluation               End of Session Equipment Utilized During Treatment: Gait belt Activity Tolerance: Patient limited by pain Patient left: in bed;with call bell/phone within reach;with bed alarm set Nurse Communication: Mobility status         Time: 1100-1142 PT Time Calculation (min) (ACUTE ONLY): 42 min   Charges:   PT Evaluation $PT Eval Moderate Complexity: 1  Procedure PT Treatments $Therapeutic Activity: 23-37 mins   PT G Codes:        Rolinda Roan 2015-12-22, 4:04 PM   Rolinda Roan, PT, DPT Acute Rehabilitation Services Pager: [phone]

## 2015-12-10 NOTE — Op Note (Signed)
NAMEMarland Kitchen  STONY, STEGMANN NO.:  [id number]  MEDICAL RECORD NO.:  [id number]  LOCATION:  5N11C                        FACILITY:  [facility]  PHYSICIAN:  Lind Guest. Ninfa Linden, M.D.DATE OF BIRTH:  [birth year]  DATE OF PROCEDURE:  12/09/2015 DATE OF DISCHARGE:                              OPERATIVE REPORT   PREOPERATIVE DIAGNOSIS:  Left pathologic femoral shaft fracture with displacement.  POSTOPERATIVE DIAGNOSIS:  Left pathologic femoral shaft fracture with displacement.  PROCEDURE:  Intramedullary nail placement, left femur.  IMPLANTS:  [employer] Intertan 10 nail measuring 11.5 mm x 44 cm with a 95 mm and 90 mm lag compression screw integrated locking system.  SURGEON:  Lind Guest. Ninfa Linden, MD.  ASSISTANT:  Erskine Emery, PA-C.  ANESTHESIA:  General.  ANTIBIOTICS:  2 g IV Ancef.  BLOOD LOSS:  250 mL.  COMPLICATIONS:  None.  INDICATIONS:  Mr. Sheeler is a 55 year old gentleman who at phone reported some weight loss, when on Friday of this week, he was stepping on and twisted and actually fractured his left femur.  This was a transverse fracture and x-ray showed there was definitely a lytic lesion where he fractured.  He was placed in Buck's traction and admitted to the Medicine Service because he needed a full cancer metastatic workup. CT scan of the abdomen and pelvis did review mass in his lung as well as his several areas of adenopathy.  There were adrenal lesions and even noted to have some scrotal issues as well.  Source is femur goes, there were definitely lesions in the proximal femur and the femoral neck and intertrochanteric area as well even this being metastatic disease at this point, a surgical intervention was recommended to stabilize the femur fracture.  Yesterday, a CT guided biopsy was done of his lung mass.  We are now proceeding for surgical stabilization of the femur. The risks and benefits of this have been explained is in  detail he does wish to proceed for procedures performed consent was obtained, appropriate left femur was marked.  He was brought to the operating room.  General anesthesia was obtained while he was on the stretcher. He was then placed supine on the fracture table with his left leg in inline skeletal traction and his right nonoperative leg secured with appropriate foam to the table.  We then pulled traction to reduce the fracture and got a near anatomically aligned to the midshaft.  We then prepped his right thigh from the pelvis area all the way past the knee with DuraPrep and sterile drapes.  Time-out was called.  He was identified as correct patient and correct left femur.  I then made an incision proximal to the greater trochanter and dissected down to the tip of greater trochanter.  Under direct visualization and fluoroscopy. A temporary guide pin was inserted in antegrade fashion.  This was verified under fluoroscopic guidance.  An initiating reamer used to open up the proximal femur.  We send those reamings for permanent pathologic identification.  Once we opened the femoral canal, we were able to place the temporary guide rod all the way down the femoral canal traversing the fracture down to the level of the knee.  We took a measurement of this and chose a 44 mm length nail which was longest of the [place] system from [place], and I felt like I needed that as opposed to recon nail.  We then began reaming from a size 9 mm reamer and step +5 mm increments up to a size 13 with getting appropriate chatter we then placed the 11.5 mm x 44 cm nail over the guide rod down the femoral canal and then removed the temporary guide rod.  We then made a separate lateral incision in the thigh and drilled for the integrated interlocking lag screw system and took measurements off this and chose a 95 mm lag screw with 90 mm compression screw seeing they were not compressing the fracture.   This was for security in the femoral neck and head.  We placed these 2 screws without difficulty.  We then removed the outrigger guide and then placed 2 distal interlocking screws from lateral to medial position through the femoral nail.  We then irrigated all wounds and closed the deep tissue with 0 Vicryl followed by 2-0 Vicryl subcutaneous tissue, interrupted staples on the skin.  Well- padded sterile dressing was applied.  He was taken off the fracture table, awakened, extubated, and taken to the recovery room in stable condition.  All final counts were correct.  There were no complications noted.  Of note, Erskine Emery, PA-C assisted the entire case.  His assistance was crucial for facilitating all aspects of this case.  Of note, we did send all reamings from the femur as well to pathology to see if there were any pathology identified from the this soft tissue. Postoperatively, we will let him attempt weightbearing as tolerated. Oncology has already consulted on this patient as well because most likely he will need radiation treatment to the femur and proximal femur.     Lind Guest. Ninfa Linden, M.D.     CYB/MEDQ  D:  12/09/2015  T:  12/10/2015  Job:  [id number]

## 2015-12-10 NOTE — Progress Notes (Signed)
Orthopedic Tech Progress Note Patient Details:  Marvin Hardy 02-Feb-1961 [id number]  Ortho Devices Ortho Device/Splint Location: applied ohf to bed Ortho Device/Splint Interventions: Ordered, Application   Braulio Bosch 12/10/2015, 9:21 PM

## 2015-12-10 NOTE — Progress Notes (Signed)
Subjective: 1 Day Post-Op Procedure(s) (LRB): INTRAMEDULLARY (IM) NAIL LEFT FEMUR (Left) Patient reports pain as moderate.  Tolerated surgery well to stabilize left femur with an IM rod.  Objective: Vital signs in last 24 hours: Temp:  [97.9 F (36.6 C)-99.3 F (37.4 C)] 99.3 F (37.4 C) (05/10 0608) Pulse Rate:  [59-89] 83 (05/10 0608) Resp:  [9-18] 18 (05/10 0608) BP: (114-123)/(59-99) 114/62 mmHg (05/10 0608) SpO2:  [96 %-100 %] 100 % (05/10 4982)  Intake/Output from previous day: 05/09 0701 - 05/10 0700 In: 2275 [P.O.:240; I.V.:1885; IV Piggyback:150] Out: 950 [Urine:700; Blood:250] Intake/Output this shift:     Recent Labs  12/08/15 0221 12/09/15 0714 12/10/15 0306  HGB 10.6* 10.5* 9.4*    Recent Labs  12/09/15 0714 12/10/15 0306  WBC 5.4 6.5  RBC 3.08* 2.90*  HCT 31.4* 28.8*  PLT 290 316    Recent Labs  12/09/15 0714 12/10/15 0306  NA 133* 133*  K 4.0 3.9  CL 96* 97*  CO2 26 26  BUN 16 11  CREATININE 0.80 0.66  GLUCOSE 97 127*  CALCIUM 8.2* 8.1*    Recent Labs  12/07/15 1309 12/08/15 0221  INR 1.08 1.08    Sensation intact distally Intact pulses distally Dorsiflexion/Plantar flexion intact Incision: scant drainage Compartment soft  Assessment/Plan: 1 Day Post-Op Procedure(s) (LRB): INTRAMEDULLARY (IM) NAIL LEFT FEMUR (Left) Up with therapy - WBAT left femur with walker  BLACKMAN,CHRISTOPHER Y 12/10/2015, 7:20 AM

## 2015-12-10 NOTE — Progress Notes (Signed)
Subjective: Marvin Hardy had no acute events overnight. He is recovering as expected from his surgery yesterday. He is reporting significant pain as well as nausea since this morning.   Objective: Vital signs in last 24 hours: Filed Vitals:   12/10/15 0100 12/10/15 0356 12/10/15 0608 12/10/15 0800  BP: 120/62  114/62   Pulse: 85  83   Temp: 98 F (36.7 C)  99.3 F (37.4 C)   TempSrc: Oral  Oral   Resp: '15 12 18 15  '$ Height:      Weight:      SpO2: 100% 100% 100% 100%   Weight change:   Intake/Output Summary (Last 24 hours) at 12/10/15 1039 Last data filed at 12/10/15 0900  Gross per 24 hour  Intake   2515 ml  Output    800 ml  Net   1715 ml   BP 114/62 mmHg  Pulse 83  Temp(Src) 99.3 F (37.4 C) (Oral)  Resp 15  Ht '6\' 6"'$  (1.981 m)  Wt 73.211 kg (161 lb 6.4 oz)  BMI 18.66 kg/m2  SpO2 100% General appearance: alert and cooperative Lungs: clear to auscultation bilaterally Chest wall: no tenderness, fixed, nontender mass on lateral left chest wall  Heart: regular rate and rhythm, S1, S2 normal, no murmur, click, rub or gallop  Lab Results: CBC    Component Value Date/Time   WBC 6.5 12/10/2015 0306   RBC 2.90* 12/10/2015 0306   RBC 3.45* 12/07/2015 0319   HGB 9.4* 12/10/2015 0306   HCT 28.8* 12/10/2015 0306   PLT 316 12/10/2015 0306   MCV 99.3 12/10/2015 0306   MCH 32.4 12/10/2015 0306   MCHC 32.6 12/10/2015 0306   RDW 13.0 12/10/2015 0306   BMP Latest Ref Rng 12/10/2015 12/09/2015 12/08/2015  Glucose 65 - 99 mg/dL 127(H) 97 94  BUN 6 - 20 mg/dL '11 16 15  '$ Creatinine 0.61 - 1.24 mg/dL 0.66 0.80 0.67  Sodium 135 - 145 mmol/L 133(L) 133(L) 134(L)  Potassium 3.5 - 5.1 mmol/L 3.9 4.0 3.9  Chloride 101 - 111 mmol/L 97(L) 96(L) 100(L)  CO2 22 - 32 mmol/L '26 26 25  '$ Calcium 8.9 - 10.3 mg/dL 8.1(L) 8.2(L) 8.2(L)    Micro Results: Recent Results (from the past 240 hour(s))  Surgical pcr screen     Status: None   Collection Time: 12/08/15  9:45 PM  Result Value Ref  Range Status   MRSA, PCR NEGATIVE NEGATIVE Final   Staphylococcus aureus NEGATIVE NEGATIVE Final    Comment:        The Xpert SA Assay (FDA approved for NASAL specimens in patients over 98 years of age), is one component of a comprehensive surveillance program.  Test performance has been validated by [facility] for patients greater than or equal to 27 year old. It is not intended to diagnose infection nor to guide or monitor treatment.    Studies/Results: US Biopsy  12/08/2015  INDICATION: Left chest wall palpable mass, right lower lobe mass, concern for metastatic disease EXAM: ULTRASOUND GUIDED LEFT CHEST WALL SOFT TISSUE MASS BIOPSY MEDICATIONS: 1% LIDOCAINE LOCALLY ANESTHESIA/SEDATION: Versed 1.5 mg IV Moderate Sedation Time:  10 minutes The patient was continuously monitored during the procedure by the interventional radiology nurse under my direct supervision. PROCEDURE: The procedure, risks, benefits, and alternatives were explained to the patient. Questions regarding the procedure were encouraged and answered. The patient understands and consents to the procedure. The left chest wall soft tissue mass was prepped with ChloraPrep in a sterile  fashion, and a sterile drape was applied covering the operative field. A sterile gown and sterile gloves were used for the procedure. Local anesthesia was provided with 1% Lidocaine. Previous imaging reviewed. Preliminary ultrasound performed. The left chest wall soft tissue mass was imaged with ultrasound. Overlying skin was marked. Under sterile conditions and local anesthesia, a 17 gauge coaxial needle was advanced into the lesion under direct ultrasound. Images obtained for documentation. 3 18 gauge core biopsies obtained through the access. Needle tract embolized with Gel-Foam. Samples were placed in formalin. No immediate complication. Patient tolerated the procedure well. COMPLICATIONS: None immediate. FINDINGS: Imaging confirms needle placed in  in the left chest wall mass for core biopsy IMPRESSION: Successful ultrasound left chest wall soft tissue mass 18 gauge core biopsy Electronically Signed   By: Jerilynn Mages.  Shick M.D.   On: 12/08/2015 12:33   Dg C-arm 1-60 Min  12/09/2015  CLINICAL DATA:  55 year old male undergoing ORIF for pathologic fracture of the left femoral midshaft. Right lower lobe lung mass. Initial encounter. EXAM: LEFT FEMUR 2 VIEWS; DG C-ARM 61-120 MIN COMPARISON:  Preoperative left femur series 5617. Chest CT 12/06/2015. FLUOROSCOPY TIME:  2 minutes 30 seconds FINDINGS: Six intraoperative fluoroscopic images of the left femur a demonstrating intra medullary rod placement with proximal interlocking cannulated and distal interlocking cortical screws. Near anatomic alignment now about the pathologic transverse comminuted midshaft fracture. IMPRESSION: ORIF of the left femur midshaft pathologic fracture with no adverse features. Electronically Signed   By: Genevie Ann M.D.   On: 12/09/2015 15:55   Dg Femur Min 2 Views Left  12/09/2015  CLINICAL DATA:  55 year old male undergoing ORIF for pathologic fracture of the left femoral midshaft. Right lower lobe lung mass. Initial encounter. EXAM: LEFT FEMUR 2 VIEWS; DG C-ARM 61-120 MIN COMPARISON:  Preoperative left femur series 5617. Chest CT 12/06/2015. FLUOROSCOPY TIME:  2 minutes 30 seconds FINDINGS: Six intraoperative fluoroscopic images of the left femur a demonstrating intra medullary rod placement with proximal interlocking cannulated and distal interlocking cortical screws. Near anatomic alignment now about the pathologic transverse comminuted midshaft fracture. IMPRESSION: ORIF of the left femur midshaft pathologic fracture with no adverse features. Electronically Signed   By: Genevie Ann M.D.   On: 12/09/2015 15:55   Medications: I have reviewed the patient's current medications. Scheduled Meds: . docusate sodium  100 mg Oral BID  . HYDROmorphone   Intravenous Q4H   Continuous Infusions: .  sodium chloride 75 mL/hr at 12/09/15 1700  . lactated ringers 50 mL/hr at 12/09/15 1350   PRN Meds:.acetaminophen **OR** acetaminophen, alum & mag hydroxide-simeth, diphenhydrAMINE **OR** diphenhydrAMINE, HYDROmorphone (DILAUDID) injection, menthol-cetylpyridinium **OR** phenol, methocarbamol **OR** methocarbamol (ROBAXIN)  IV, metoCLOPramide **OR** metoCLOPramide (REGLAN) injection, naloxone **AND** sodium chloride flush, ondansetron (ZOFRAN) IV, ondansetron **OR** ondansetron (ZOFRAN) IV, oxyCODONE, promethazine  Assessment/Plan: Mr. Cozart is a 55 yo previously healthy man who presented to the ED with a pathological fracture of his left femur who was found to have multiple tumors involving the left femur, bilateral adrenals and left axillary chest wall.   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 Xray showed transverse displacement to the mid femoral diaphysis with irregular lytic lesions and periosteal reaction involving the mid femoral diaphysis at the fracture site. This is concerning for either a primary bone cancer or metastatic disease. CT of the femur showed a pathologic fracture at the  mid femoral diaphysis with permeative bone changes in the cortex. Additionally, there was abnormal soft tissue within the medullary space that extended into the fracture site. This is concerning for an osteosarcoma or metastatic disease. CT of chest, abdomen and pelvis revealed a right lower lung mass, possibly tumor. There were also bilateral adrenal masses found. There was an additional suprarenal mass found. On exam, a large, firm, nontender, fixated mass was found on the left chest wall near the axillary line that has been present for about 3 months.  -f/u on US guided biopsy of lateral chest wall mass  -OR yesterday; f/u on biopsy from femoral shaft -appreciate  oncology recs  -elevated kappa and lambda free light chain; normal kappa/lambda ratio -pain management: PCA dilaudid and PO oxycodone PRN; consider chronic pain management with ms contin   Right swollen testicle  Right testicle swollen on exam. CT of femur showing right-sided hydrocele versus scrotal lesion. PSA is 0.86. -scrotal ultrasound showed hydrocele   Macrocytic anemia H+H 9.4/28.8 and MCV 99.3 -continue following  This is a Careers information officer Note.  The care of the patient was discussed with Dr. Tiburcio Pea and the assessment and plan formulated with their assistance.  Please see their attached note for official documentation of the daily encounter.   LOS: 4 days   Wandra Mannan, Med Student 12/10/2015, 10:39 AM

## 2015-12-10 NOTE — Progress Notes (Signed)
Subjective:  POD# 1 s/p intramedullary nailing of the left femur  No acute events overnight.  Pt still in significant pain.   We encouraged him to use the PCA< and let him know that he has PRN oral oxycodone ordered- he has not requested any overnight so we asked him to request when he is having pain      Objective: Vital signs in last 24 hours: Filed Vitals:   12/10/15 0100 12/10/15 0356 12/10/15 0608 12/10/15 0800  BP: 120/62  114/62   Pulse: 85  83   Temp: 98 F (36.7 C)  99.3 F (37.4 C)   TempSrc: Oral  Oral   Resp: _0 Height:      Weight:      SpO2: 100% 100% 100% 100%   Weight change:   Intake/Output Summary (Last 24 hours) at 12/10/15 1115 Last data filed at 12/10/15 0900  Gross per 24 hour  Intake   2515 ml  Output    800 ml  Net   1715 ml    General: Vital signs reviewed. Patient in moderate pain Cardiovascular: regular rate, rhythm, no murmurs Pulmonary/Chest: Did not examine lungs due to pt discomfort  Abdominal: Soft, non-tender, non-distended, BS + Extremities:   Has bandaged at the inferior left femur site- looks c/d/i. Ice packs at the top. No swelling or warmth.      Lab Results: Results for orders placed or performed during the hospital encounter of 12/06/15 (from the past 24 hour(s))  CBC     Status: Abnormal   Collection Time: 12/10/15  3:06 AM  Result Value Ref Range   WBC 6.5 4.0 - 10.5 K/uL   RBC 2.90 (L) 4.22 - 5.81 MIL/uL   Hemoglobin 9.4 (L) 13.0 - 17.0 g/dL   HCT 28.8 (L) 39.0 - 52.0 %   MCV 99.3 78.0 - 100.0 fL   MCH 32.4 26.0 - 34.0 pg   MCHC 32.6 30.0 - 36.0 g/dL   RDW 13.0 11.5 - 15.5 %   Platelets 316 150 - 400 K/uL  Basic metabolic panel     Status: Abnormal   Collection Time: 12/10/15  3:06 AM  Result Value Ref Range   Sodium 133 (L) 135 - 145 mmol/L   Potassium 3.9 3.5 - 5.1 mmol/L   Chloride 97 (L) 101 - 111 mmol/L   CO2 26 22 - 32 mmol/L   Glucose, Bld 127 (H) 65 - 99 mg/dL   BUN 11 6 - 20 mg/dL   Creatinine, Ser 0.66 0.61 - 1.24 mg/dL   Calcium 8.1 (L) 8.9 - 10.3 mg/dL   GFR calc non Af Amer >60 >60 mL/min   GFR calc Af Amer >60 >60 mL/min   Anion gap 10 5 - 15    Micro Results: Recent Results (from the past 240 hour(s))  Surgical pcr screen     Status: None   Collection Time: 12/08/15  9:45 PM  Result Value Ref Range Status   MRSA, PCR NEGATIVE NEGATIVE Final   Staphylococcus aureus NEGATIVE NEGATIVE Final    Comment:        The Xpert SA Assay (FDA approved for NASAL specimens in patients over 95 years of age), is one component of a comprehensive surveillance program.  Test performance has been validated by [facility] for patients greater than or equal to 82 year old. It is not intended to diagnose infection nor to guide or monitor treatment.    Studies/Results: US Biopsy  12/08/2015  INDICATION: Left chest wall palpable mass, right lower lobe mass, concern for metastatic disease EXAM: ULTRASOUND GUIDED LEFT CHEST WALL SOFT TISSUE MASS BIOPSY MEDICATIONS: 1% LIDOCAINE LOCALLY ANESTHESIA/SEDATION: Versed 1.5 mg IV Moderate Sedation Time:  10 minutes The patient was continuously monitored during the procedure by the interventional radiology nurse under my direct supervision. PROCEDURE: The procedure, risks, benefits, and alternatives were explained to the patient. Questions regarding the procedure were encouraged and answered. The patient understands and consents to the procedure. The left chest wall soft tissue mass was prepped with ChloraPrep in a sterile fashion, and a sterile drape was applied covering the operative field. A sterile gown and sterile gloves were used for the procedure. Local anesthesia was provided with 1% Lidocaine. Previous imaging reviewed. Preliminary ultrasound performed. The left chest wall soft tissue mass was imaged with ultrasound. Overlying skin was marked. Under sterile conditions and local anesthesia, a 17 gauge coaxial needle was advanced into the  lesion under direct ultrasound. Images obtained for documentation. 3 18 gauge core biopsies obtained through the access. Needle tract embolized with Gel-Foam. Samples were placed in formalin. No immediate complication. Patient tolerated the procedure well. COMPLICATIONS: None immediate. FINDINGS: Imaging confirms needle placed in in the left chest wall mass for core biopsy IMPRESSION: Successful ultrasound left chest wall soft tissue mass 18 gauge core biopsy Electronically Signed   By: Jerilynn Mages.  Shick M.D.   On: 12/08/2015 12:33   Dg C-arm 1-60 Min  12/09/2015  CLINICAL DATA:  55 year old male undergoing ORIF for pathologic fracture of the left femoral midshaft. Right lower lobe lung mass. Initial encounter. EXAM: LEFT FEMUR 2 VIEWS; DG C-ARM 61-120 MIN COMPARISON:  Preoperative left femur series 5617. Chest CT 12/06/2015. FLUOROSCOPY TIME:  2 minutes 30 seconds FINDINGS: Six intraoperative fluoroscopic images of the left femur a demonstrating intra medullary rod placement with proximal interlocking cannulated and distal interlocking cortical screws. Near anatomic alignment now about the pathologic transverse comminuted midshaft fracture. IMPRESSION: ORIF of the left femur midshaft pathologic fracture with no adverse features. Electronically Signed   By: Genevie Ann M.D.   On: 12/09/2015 15:55   Dg Femur Min 2 Views Left  12/09/2015  CLINICAL DATA:  55 year old male undergoing ORIF for pathologic fracture of the left femoral midshaft. Right lower lobe lung mass. Initial encounter. EXAM: LEFT FEMUR 2 VIEWS; DG C-ARM 61-120 MIN COMPARISON:  Preoperative left femur series 5617. Chest CT 12/06/2015. FLUOROSCOPY TIME:  2 minutes 30 seconds FINDINGS: Six intraoperative fluoroscopic images of the left femur a demonstrating intra medullary rod placement with proximal interlocking cannulated and distal interlocking cortical screws. Near anatomic alignment now about the pathologic transverse comminuted midshaft fracture.  IMPRESSION: ORIF of the left femur midshaft pathologic fracture with no adverse features. Electronically Signed   By: Genevie Ann M.D.   On: 12/09/2015 15:55   Medications: I have reviewed the patient's current medications. Scheduled Meds: . docusate sodium  100 mg Oral BID  . HYDROmorphone   Intravenous Q4H   Continuous Infusions: . sodium chloride 75 mL/hr at 12/09/15 1700  . lactated ringers 50 mL/hr at 12/09/15 1350   PRN Meds:.acetaminophen **OR** acetaminophen, alum & mag hydroxide-simeth, diphenhydrAMINE **OR** diphenhydrAMINE, HYDROmorphone (DILAUDID) injection, menthol-cetylpyridinium **OR** phenol, methocarbamol **OR** methocarbamol (ROBAXIN)  IV, metoCLOPramide **OR** metoCLOPramide (REGLAN) injection, naloxone **AND** sodium chloride flush, ondansetron (ZOFRAN) IV, ondansetron **OR** ondansetron (ZOFRAN) IV, oxyCODONE, promethazine Assessment/Plan: Active Problems:   Femur fracture (HCC)   Femur fracture, left (HCC)   Closed fracture of shaft  of left femur (HCC)   Swelling of right testicle   Fracture   Mass of soft tissue   Pain  Metastatic cancer, differentials include primary lung, sarcoma, 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 Brain MRI done on Sunday.  No M spike observed on protein electrophoresis, so unlikely to be MM. Kappa free chain 29 and lambda chain of 47. Per pathologist, biopsy results are still pending, however, it seems to be poorly differentiated variant.  -CT guided biopsy results pending -Consulted Oncology- following and appreciate recs -Orthopedics following -follow up multiple myeloma panel, 24 hr urine IFE -chaplain support     Pathologic left femur fracture: in the setting of metastatic cancer. Left femoral neck lytic lesion. Pt is POD#1 s/p fixation of left femur and in significant pain   -orthopaedics following  -PCA pump will be continued  today -Oxycodone 5-10 mg q4 hours PRN    Anaemia of chronic disease- HgB stable and MCV is borderline high  -Hgb stable following surgery    DVT Ppx: Pt is on lovenox 40 mg daily  Diet- reg  Dispo: Disposition is deferred at this time, awaiting improvement of current medical problems.  Anticipated discharge in approximately 3 day(s).   The patient does have a current PCP (No Pcp Per Patient) and does not need an [facility] hospital follow-up appointment after discharge.  The patient does not have transportation limitations that hinder transportation to clinic appointments.  .Services Needed at time of discharge: Y = Yes, Blank = No PT:   OT:   RN:   Equipment:   Other:     LOS: 4 days   Burgess Estelle, MD 12/10/2015, 11:15 AM

## 2015-12-11 DIAGNOSIS — Z9889 Other specified postprocedural states: Secondary | ICD-10-CM

## 2015-12-11 DIAGNOSIS — M799 Soft tissue disorder, unspecified: Secondary | ICD-10-CM

## 2015-12-11 DIAGNOSIS — S72322A Displaced transverse fracture of shaft of left femur, initial encounter for closed fracture: Secondary | ICD-10-CM

## 2015-12-11 MED ORDER — OXYCODONE HCL 5 MG PO TABS
5.0000 mg | ORAL_TABLET | ORAL | Status: DC | PRN
  Administered 2015-12-11 – 2015-12-13 (×5): 10 mg via ORAL
  Filled 2015-12-11 (×5): qty 2

## 2015-12-11 MED ORDER — HYDROMORPHONE HCL 1 MG/ML IJ SOLN
1.0000 mg | INTRAMUSCULAR | Status: DC | PRN
Start: 2015-12-11 — End: 2015-12-12
  Administered 2015-12-11: 1 mg via INTRAVENOUS
  Filled 2015-12-11: qty 1

## 2015-12-11 NOTE — Progress Notes (Signed)
Subjective: Mr. Zappone had no acute events overnight. He is recovering as expected from his surgery. His pain is being managed appropriately.   Objective: Vital signs in last 24 hours: Filed Vitals:   12/11/15 0208 12/11/15 0339 12/11/15 0624 12/11/15 0800  BP:   123/67   Pulse:   79   Temp:   98.8 F (37.1 C)   TempSrc:   Oral   Resp: '15 12 14 12  '$ Height:      Weight:      SpO2: 99% 99% 100% 99%   Weight change:   Intake/Output Summary (Last 24 hours) at 12/11/15 1133 Last data filed at 12/10/15 1500  Gross per 24 hour  Intake    240 ml  Output    400 ml  Net   -160 ml   Physical Exam: BP 123/67 mmHg  Pulse 79  Temp(Src) 98.8 F (37.1 C) (Oral)  Resp 12  Ht '6\' 6"'$  (1.981 m)  Wt 73.211 kg (161 lb 6.4 oz)  BMI 18.66 kg/m2  SpO2 99% General appearance: alert and cooperative Lungs: clear to auscultation bilaterally Chest wall: no tenderness, fixed, nontender mass on lateral left chest wall  Heart: regular rate and rhythm, S1, S2 normal, no murmur, click, rub or gallop  Lab Results: CBC    Component Value Date/Time   WBC 6.5 12/10/2015 0306   RBC 2.90* 12/10/2015 0306   RBC 3.45* 12/07/2015 0319   HGB 9.4* 12/10/2015 0306   HCT 28.8* 12/10/2015 0306   PLT 316 12/10/2015 0306   MCV 99.3 12/10/2015 0306   MCH 32.4 12/10/2015 0306   MCHC 32.6 12/10/2015 0306   RDW 13.0 12/10/2015 9528   basic metabolic panel Micro Results: Recent Results (from the past 240 hour(s))  Surgical pcr screen     Status: None   Collection Time: 12/08/15  9:45 PM  Result Value Ref Range Status   MRSA, PCR NEGATIVE NEGATIVE Final   Staphylococcus aureus NEGATIVE NEGATIVE Final    Comment:        The Xpert SA Assay (FDA approved for NASAL specimens in patients over 36 years of age), is one component of a comprehensive surveillance program.  Test performance has been validated by [facility] for patients greater than or equal to 53 year old. It is not intended to diagnose  infection nor to guide or monitor treatment.    Studies/Results: Dg C-arm 1-60 Min  12/09/2015  CLINICAL DATA:  55 year old male undergoing ORIF for pathologic fracture of the left femoral midshaft. Right lower lobe lung mass. Initial encounter. EXAM: LEFT FEMUR 2 VIEWS; DG C-ARM 61-120 MIN COMPARISON:  Preoperative left femur series 5617. Chest CT 12/06/2015. FLUOROSCOPY TIME:  2 minutes 30 seconds FINDINGS: Six intraoperative fluoroscopic images of the left femur a demonstrating intra medullary rod placement with proximal interlocking cannulated and distal interlocking cortical screws. Near anatomic alignment now about the pathologic transverse comminuted midshaft fracture. IMPRESSION: ORIF of the left femur midshaft pathologic fracture with no adverse features. Electronically Signed   By: Genevie Ann M.D.   On: 12/09/2015 15:55   Dg Femur Min 2 Views Left  12/09/2015  CLINICAL DATA:  55 year old male undergoing ORIF for pathologic fracture of the left femoral midshaft. Right lower lobe lung mass. Initial encounter. EXAM: LEFT FEMUR 2 VIEWS; DG C-ARM 61-120 MIN COMPARISON:  Preoperative left femur series 5617. Chest CT 12/06/2015. FLUOROSCOPY TIME:  2 minutes 30 seconds FINDINGS: Six intraoperative fluoroscopic images of the left femur a demonstrating intra medullary  rod placement with proximal interlocking cannulated and distal interlocking cortical screws. Near anatomic alignment now about the pathologic transverse comminuted midshaft fracture. IMPRESSION: ORIF of the left femur midshaft pathologic fracture with no adverse features. Electronically Signed   By: Genevie Ann M.D.   On: 12/09/2015 15:55   Medications: I have reviewed the patient's current medications. Scheduled Meds: . docusate sodium  100 mg Oral BID   Continuous Infusions: . sodium chloride 75 mL/hr at 12/11/15 0342  . lactated ringers 50 mL/hr at 12/09/15 1350   PRN Meds:.acetaminophen **OR** acetaminophen, alum & mag hydroxide-simeth,  HYDROmorphone (DILAUDID) injection, menthol-cetylpyridinium **OR** phenol, methocarbamol **OR** methocarbamol (ROBAXIN)  IV, metoCLOPramide **OR** metoCLOPramide (REGLAN) injection, ondansetron **OR** ondansetron (ZOFRAN) IV, oxyCODONE, promethazine  Assessment/Plan: Mr. Monforte is a 55 yo previously healthy man who presented to the ED with a pathological fracture of his left femur who was found to have multiple tumors involving the left femur, bilateral adrenals and left axillary chest wall.   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 Xray showed transverse displacement to the mid femoral diaphysis with irregular lytic lesions and periosteal reaction involving the mid femoral diaphysis at the fracture site. This is concerning for either a primary bone cancer or metastatic disease. CT of the femur showed a pathologic fracture at the mid femoral diaphysis with permeative bone changes in the cortex. Additionally, there was abnormal soft tissue within the medullary space that extended into the fracture site. This is concerning for an osteosarcoma or metastatic disease. CT of chest, abdomen and pelvis revealed a right lower lung mass, possibly tumor. There were also bilateral adrenal masses found. There was an additional suprarenal mass found. On exam, a large, firm, nontender, fixated mass was found on the left chest wall near the axillary line that has been present for about 3 months.  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 CT of femur  showing right-sided hydrocele versus scrotal lesion. PSA is 0.86. -scrotal ultrasound showed hydrocele   Macrocytic anemia H+H 9.4/28.8 and MCV 99.3 yesterday -continue following  This is a Careers information officer Note.  The care of the patient was discussed with Dr. Tiburcio Pea and the assessment and plan formulated with their assistance.  Please see their attached note for official documentation of the daily encounter.   LOS: 5 days   Wandra Mannan, Med Student 12/11/2015, 11:33 AM

## 2015-12-11 NOTE — Progress Notes (Signed)
Occupational Therapy Evaluation Patient Details Name: Marvin Hardy MRN: [id number] DOB: [birth year] Today's Date: 12/11/2015    History of Present Illness Pt is a 55 yo M with no significant past medical history presenting to the hospital with pain in his left lower extremity. Patient states he was at work and twisted his left leg while trying to turn while walking down a step. States he heard his "bone pop" when this happened. Denies having any history of fractures in the past. 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 He is now s/p IM nailing and is WBAT.   Clinical Impression   PTA, pt independent with self care, used a cane for mobility and worked Occupational psychologist. Eval limited by pain. Affect flat during session. Pt states he is essentially homeless and uncertain of D/C plans. Pt will benefit from rehab at [facility] once pain is controlled. Recommend Palliative Care consult to assist with pain management and support regarding disease management in order to facilitate rehab process. Will follow acutely to facilitate D/c tonext venue.    Follow Up Recommendations  [facility];Supervision/Assistance - 24 hour    Equipment Recommendations  3 in 1 bedside comode    Recommendations for Other Services Rehab consult  Palliative Care Consult     Precautions / Restrictions Precautions Precautions: Fall Restrictions Weight Bearing Restrictions: Yes LLE Weight Bearing: Weight bearing as tolerated      Mobility Bed Mobility Attempted then pt stating he "just can't" Transfers        General transfer comment: +2 Mod A per PT Note. Will further assess    Balance                          ADL Overall ADL's : Needs assistance/impaired                                       General ADL Comments: Eval limited to bed level due to pain. Pt very flat during session. States he did'nt know what he was  going to do. limited mobility in bed due to pain. Attempted to reposition LLE unsuccessfully. Pt declined getting out of bed this pm.  Currently Max A for LB ADL at bed level with the exception of grooming. Will further assess.     Vision  reports no change   Perception     Praxis      Pertinent Vitals/Pain Pain Assessment: 0-10 Pain Score:  (12) Pain Location: L hip Pain Descriptors / Indicators: Aching;Operative site guarding Pain Intervention(s): Limited activity within patient's tolerance     Hand Dominance Right   Extremity/Trunk Assessment Upper Extremity Assessment Upper Extremity Assessment: Generalized weakness   Lower Extremity Assessment Lower Extremity Assessment: Defer to PT evaluation LLE Deficits / Details: Pt declined to move LLE   Cervical / Trunk Assessment Cervical / Trunk Assessment: Normal   Communication Communication Communication: No difficulties   Cognition Arousal/Alertness: Lethargic Behavior During Therapy: Flat affect Overall Cognitive Status: No family/caregiver present to determine baseline cognitive functioning                     General Comments       Exercises       Shoulder Instructions      Home Living Family/patient expects to be discharged to:: Unsure  Home Access:  (Unsure)                         Additional Comments: Pt states "essentailly I'm homeless". Pt states he lives with a friend who has moved and he doesn't know where he will go after D/C.      Prior Functioning/Environment Level of Independence: Independent with assistive device(s)        Comments: Used the cane all the time.     OT Diagnosis: Generalized weakness;Acute pain   OT Problem List: Decreased strength;Decreased range of motion;Decreased activity tolerance;Impaired balance (sitting and/or standing);Decreased knowledge of use of DME or AE;Decreased knowledge of precautions;Pain;Increased edema   OT  Treatment/Interventions: Self-care/ADL training;Therapeutic exercise;Energy conservation;DME and/or AE instruction;Therapeutic activities;Patient/family education;Balance training    OT Goals(Current goals can be found in the care plan section) Acute Rehab OT Goals Patient Stated Goal: Decrease pain OT Goal Formulation: With patient Time For Goal Achievement: 12/25/15 Potential to Achieve Goals: Fair ADL Goals Pt Will Perform Upper Body Bathing: with set-up;sitting Pt Will Perform Lower Body Bathing: with set-up;with supervision;with caregiver independent in assisting;with adaptive equipment;sit to/from stand Pt Will Transfer to Toilet: bedside commode;with min guard assist;stand pivot transfer Pt Will Perform Toileting - Clothing Manipulation and hygiene: with supervision;sitting/lateral leans  OT Frequency: Min 2X/week   Barriers to D/C: Other (comment)  Pt states he is "homeless" need to confirm       Co-evaluation              End of Session Nurse Communication: Mobility status  Activity Tolerance: Patient limited by pain Patient left: in bed;with call bell/phone within reach   Time: 1655-1709 OT Time Calculation (min): 14 min Charges:  OT General Charges $OT Visit: 1 Procedure OT Evaluation $OT Eval Moderate Complexity: 1 Procedure G-Codes:    Kam Kushnir,HILLARY 01-04-2016, 5:25 PM   [facility], OTR/L  [phone] 2016/01/04

## 2015-12-11 NOTE — Consult Note (Signed)
Physical Medicine and Rehabilitation Consult Reason for Consult: Pathologic left femur fracture in the stating of extensive metastatic disease/undifferentiated adenocarcinoma Referring Physician: Family medicine   HPI: Marvin Hardy is a 55 y.o. right handed male with history of tobacco abuse. Presented 12/06/2015 when he felt a pop in his left leg while twisting while at work. Denied any fall. Patient currently lives with a friend and was independent prior to admission. He currently is unsure of his discharge plan he has no local family. X-rays and imaging revealed left displaced transverse fractures of the mid femoral diaphysis as well as irregular lytic lesion and periosteal reaction involving the mid femoral diaphysis at the fracture site concerning for possibility of metastatic disease or multiple myeloma. CT abdomen and pelvis as well as CT of the chest showed a 2.5 x 3.6 right lower lobe lung mass suspicious for neoplasm. Bilateral adrenal masses. Lytic lesions of the femoral neck and intertrochanteric region. 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 Follow-up oncology service of Dr. Earlie Server and await further plan of care. Underwent intramedullary nail placement left femur 12/09/2015 per Dr. Ninfa Linden. Hospital course pain management. Acute blood loss anemia 9.4 and monitored. Weightbearing as tolerated. Physical therapy evaluation completed with recommendations of physical medicine rehabilitation consult.   Review of Systems  Constitutional: Positive for weight loss. Negative for fever and chills.  HENT: Negative for hearing loss.   Eyes: Negative for blurred vision and double vision.  Respiratory: Positive for cough.   Cardiovascular: Negative for chest pain, palpitations and leg swelling.  Gastrointestinal: Positive for constipation. Negative for vomiting.  Genitourinary: Positive for urgency. Negative for dysuria.    Musculoskeletal: Positive for myalgias and joint pain.  Skin: Negative for rash.  Neurological: Positive for seizures. Negative for headaches.  All other systems reviewed and are negative.  History reviewed. No pertinent past medical history. Past Surgical History  Procedure Laterality Date  . Femur im nail Left 12/09/2015    Procedure: INTRAMEDULLARY (IM) NAIL LEFT FEMUR;  Surgeon: Mcarthur Rossetti, MD;  Location: [place];  Service: Orthopedics;  Laterality: Left;   Family History  Problem Relation Age of Onset  . Hypertension Mother   . Heart disease Mother   . Diabetes Mother   . Lung cancer Father    Social History:  reports that he has been smoking Cigars.  He does not have any smokeless tobacco history on file. He reports that he drinks alcohol. He reports that he does not use illicit drugs. Allergies:  Allergies  Allergen Reactions  . Aleve [Naproxen Sodium] Itching        Medications Prior to Admission  Medication Sig Dispense Refill  . acetaminophen (TYLENOL) 500 MG tablet Take 1 tablet (500 mg total) by mouth every 6 (six) hours as needed. (Patient taking differently: Take 500 mg by mouth every 6 (six) hours as needed for moderate pain. ) 30 tablet 0  . Aspirin-Salicylamide-Caffeine (BC FAST PAIN RELIEF) 650-195-33.3 MG PACK Take 1 Package by mouth daily as needed (for pain).    Marland Kitchen ibuprofen (ADVIL,MOTRIN) 800 MG tablet Take 800 mg by mouth every 4 (four) hours as needed for moderate pain.    . naproxen (NAPROSYN) 500 MG tablet Take 1 tablet (500 mg total) by mouth 2 (two) times daily. 30 tablet 0    Home: Home Living Family/patient expects to be discharged to:: Private residence Living Arrangements: Non-relatives/Friends Available Help at Discharge: Family, Available PRN/intermittently Type of Home: House  Home Access:  (Unsure) Home Equipment: Kasandra Knudsen - single point Additional Comments: Pt reports his roommate has found a new place to live for them and he does  know currently know any details of the living situation he will be going back to.   Functional History: Prior Function Level of Independence: Independent with assistive device(s) Comments: Used the cane all the time.  Functional Status:  Mobility: Bed Mobility Overal bed mobility: Needs Assistance Bed Mobility: Supine to Sit Supine to sit: Mod assist General bed mobility comments: Assist for LLE mobility. Pt requires increased time and many rest breaks to complete transition to EOB.  Transfers Overall transfer level: Needs assistance Equipment used: Rolling walker (2 wheeled) Transfers: Sit to/from Stand Sit to Stand: Mod assist, +2 physical assistance, From elevated surface General transfer comment: Increased time and +2 assist required to power-up to full standing position. Pt with increased pain which was main limiting factor.  Ambulation/Gait General Gait Details: Unable to take steps. Pt participated in minimal pre-gait activity while standing EOB.     ADL:    Cognition: Cognition Overall Cognitive Status: Within Functional Limits for tasks assessed Orientation Level: Oriented X4 Cognition Arousal/Alertness: Awake/alert Behavior During Therapy: WFL for tasks assessed/performed Overall Cognitive Status: Within Functional Limits for tasks assessed  Blood pressure 113/63, pulse 82, temperature 98.7 F (37.1 C), temperature source Oral, resp. rate 17, height 6' 6"  (1.981 m), weight 73.211 kg (161 lb 6.4 oz), SpO2 100 %. Physical Exam  Constitutional: He is oriented to person, place, and time. He appears well-developed.  HENT:  Head: Normocephalic.  Eyes: EOM are normal.  Neck: Normal range of motion. Neck supple. No thyromegaly present.  Cardiovascular: Normal rate and regular rhythm.   Respiratory:  Decreased breath sounds at the bases but clear to auscultation  GI: Soft. Bowel sounds are normal. He exhibits no distension.  Neurological: He is alert and oriented to  person, place, and time.  Skin:  Surgical site clean and dry  Left knee effusion Left thigh is swollen and tender no ecchymosis.  No results found for this or any previous visit (from the past 24 hour(s)). Dg C-arm 1-60 Min  12/09/2015  CLINICAL DATA:  55 year old male undergoing ORIF for pathologic fracture of the left femoral midshaft. Right lower lobe lung mass. Initial encounter. EXAM: LEFT FEMUR 2 VIEWS; DG C-ARM 61-120 MIN COMPARISON:  Preoperative left femur series 5617. Chest CT 12/06/2015. FLUOROSCOPY TIME:  2 minutes 30 seconds FINDINGS: Six intraoperative fluoroscopic images of the left femur a demonstrating intra medullary rod placement with proximal interlocking cannulated and distal interlocking cortical screws. Near anatomic alignment now about the pathologic transverse comminuted midshaft fracture. IMPRESSION: ORIF of the left femur midshaft pathologic fracture with no adverse features. Electronically Signed   By: Genevie Ann M.D.   On: 12/09/2015 15:55   Dg Femur Min 2 Views Left  12/09/2015  CLINICAL DATA:  55 year old male undergoing ORIF for pathologic fracture of the left femoral midshaft. Right lower lobe lung mass. Initial encounter. EXAM: LEFT FEMUR 2 VIEWS; DG C-ARM 61-120 MIN COMPARISON:  Preoperative left femur series 5617. Chest CT 12/06/2015. FLUOROSCOPY TIME:  2 minutes 30 seconds FINDINGS: Six intraoperative fluoroscopic images of the left femur a demonstrating intra medullary rod placement with proximal interlocking cannulated and distal interlocking cortical screws. Near anatomic alignment now about the pathologic transverse comminuted midshaft fracture. IMPRESSION: ORIF of the left femur midshaft pathologic fracture with no adverse features. Electronically Signed   By: Herminio Heads.D.  On: 12/09/2015 15:55    Assessment/Plan: Diagnosis: Left pathologic midshaft femur fracture status post IM nail 12/09/2015, weightbearing as tolerated 1. Does the need for close, 24 hr/day  medical supervision in concert with the patient's rehab needs make it unreasonable for this patient to be served in a less intensive setting? Yes 2. Co-Morbidities requiring supervision/potential complications: Metastatic adenocarcinoma either lung or GI primary 3. Due to bladder management, bowel management, safety, skin/wound care, disease management, medication administration, pain management and patient education, does the patient require 24 hr/day rehab nursing? Yes 4. Does the patient require coordinated care of a physician, rehab nurse, PT (1-2 hrs/day, 5 days/week) and OT (2 hrs/day, 5 days/week) to address physical and functional deficits in the context of the above medical diagnosis(es)? Yes Addressing deficits in the following areas: balance, endurance, locomotion, strength, transferring, bowel/bladder control, bathing, dressing, feeding, grooming and toileting 5. Can the patient actively participate in an intensive therapy program of at least 3 hrs of therapy per day at least 5 days per week? Yes 6. The potential for patient to make measurable gains while on inpatient rehab is good 7. Anticipated functional outcomes upon discharge from inpatient rehab are modified independent and supervision  with PT, modified independent and supervision with OT, n/a with SLP. 8. Estimated rehab length of stay to reach the above functional goals is: 7-10 days 9. Does the patient have adequate social supports and living environment to accommodate these discharge functional goals? Potentially 10. Anticipated D/C setting: Home 11. Anticipated post D/C treatments: [place] therapy 12. Overall Rehab/Functional Prognosis: fair  RECOMMENDATIONS: This patient's condition is appropriate for continued rehabilitative care in the following setting: CIR Patient has agreed to participate in recommended program. Potentially Note that insurance prior authorization may be required for reimbursement for recommended  care.  Comment: Patient with left knee effusion as well as significant swelling left thigh, would like orthopedic input whether any further imaging is needed prior to rehab    12/11/2015

## 2015-12-11 NOTE — Progress Notes (Signed)
Patient transitioned from Dilaudid PCA pump to PRN pain medication management. Patient educated on the name, frequency, dosage and route of new orders. Patient verbalized understanding with teach back method. Will continue to monitor.

## 2015-12-11 NOTE — Progress Notes (Signed)
Inpatient Rehabilitation  Per PT request, patient was screened by Gunnar Fusi for appropriateness for an Inpatient Acute Rehab consult.  At this time we are recommending an Inpatient Rehab consult.  Text paged Dr. Tiburcio Pea Pager: [phone] to request order.  Please order if you are agreeable.  Of note, patient's insurance provider will need to authorize coverage prior to admission.   Carmelia Roller., CCC/SLP Admission Coordinator  [place]  Cell [phone]

## 2015-12-11 NOTE — Progress Notes (Signed)
Physical Therapy Treatment Patient Details Name: Marvin Hardy MRN: [id number] DOB: 11/04/1960 Today's Date: 12/11/2015    History of Present Illness Pt is a 55 yo M with no significant past medical history presenting to the hospital with pain in his left lower extremity. Patient states he was at work and twisted his left leg while trying to turn while walking down a step. States he heard his "bone pop" when this happened. Denies having any history of fractures in the past. 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 He is now s/p IM nailing and is WBAT.    PT Comments    Pt progressing towards physical therapy goals. Was able to achieve OOB to chair today however was significantly limited by pain. Pt with minimal knee flexion at this time and initiation of HEP was deferred until next visit due to pain. Will continue to follow and progress as able per POC.   Follow Up Recommendations  CIR;Supervision/Assistance - 24 hour     Equipment Recommendations  Rolling walker with 5" wheels;3in1 (PT)    Recommendations for Other Services Rehab consult     Precautions / Restrictions Precautions Precautions: Fall Restrictions Weight Bearing Restrictions: Yes LLE Weight Bearing: Weight bearing as tolerated    Mobility  Bed Mobility Overal bed mobility: Needs Assistance Bed Mobility: Supine to Sit     Supine to sit: Mod assist     General bed mobility comments: Assist for LLE mobility. Pt requires increased time and many rest breaks to complete transition to EOB.   Transfers Overall transfer level: Needs assistance Equipment used: Rolling walker (2 wheeled) Transfers: Sit to/from [facility] Sit to Stand: Mod assist;+2 physical assistance;From elevated surface Stand pivot transfers: Min assist;+2 physical assistance       General transfer comment: Increased time and +2 assist required to power-up to full  standing position. Pt was able to take a few pivotal steps around to the recliner chair with RW for support.   Ambulation/Gait             General Gait Details: Unable to ambulate.    Stairs            Wheelchair Mobility    Modified Rankin (Stroke Patients Only)       Balance Overall balance assessment: Needs assistance Sitting-balance support: Feet supported;No upper extremity supported Sitting balance-Leahy Scale: Poor Sitting balance - Comments: Due to pain Postural control: Posterior lean;Right lateral lean Standing balance support: Bilateral upper extremity supported;During functional activity Standing balance-Leahy Scale: Poor                      Cognition Arousal/Alertness: Awake/alert Behavior During Therapy: WFL for tasks assessed/performed Overall Cognitive Status: Within Functional Limits for tasks assessed                      Exercises      General Comments        Pertinent Vitals/Pain Pain Assessment: 0-10 Pain Score: 10-Worst pain ever Pain Location: hip Pain Descriptors / Indicators: Operative site guarding;Sharp Pain Intervention(s): Limited activity within patient's tolerance;Monitored during session;Repositioned    Home Living                      Prior Function            PT Goals (current goals can now be found in the care plan section)  Acute Rehab PT Goals Patient Stated Goal: Decrease pain PT Goal Formulation: With patient Time For Goal Achievement: 12/17/15 Potential to Achieve Goals: Good Progress towards PT goals: Progressing toward goals    Frequency  Min 5X/week    PT Plan Current plan remains appropriate    Co-evaluation             End of Session Equipment Utilized During Treatment: Gait belt Activity Tolerance: Patient limited by pain Patient left: in bed;with call bell/phone within reach;with bed alarm set     Time: 7858-8502 PT Time Calculation (min) (ACUTE ONLY): 29  min  Charges:  $Gait Training: 23-37 mins                    G Codes:      Rolinda Roan 01-02-16, 3:08 PM   Rolinda Roan, PT, DPT Acute Rehabilitation Services Pager: [phone]

## 2015-12-11 NOTE — Progress Notes (Signed)
Subjective:  POD# 2 s/p intramedullary nailing of the left femur  No acute events overnight.   Pain well controlled. Did not receive PCA pump since 1 AM as IV line had fallen off. His pain was well controlled on the oxycodone IR     Objective: Vital signs in last 24 hours: Filed Vitals:   12/11/15 0208 12/11/15 0339 12/11/15 0624 12/11/15 0800  BP:   123/67   Pulse:   79   Temp:   98.8 F (37.1 C)   TempSrc:   Oral   Resp: '15 12 14 12  '$ Height:      Weight:      SpO2: 99% 99% 100% 99%   Weight change:   Intake/Output Summary (Last 24 hours) at 12/11/15 1152 Last data filed at 12/11/15 1045  Gross per 24 hour  Intake    720 ml  Output    800 ml  Net    -80 ml    General: Vital signs reviewed. Patient comfortable Cardiovascular: regular rate, rhythm, no murmurs Pulmonary/Chest: CTAB Abdominal: Soft, non-tender, non-distended, BS + Extremities:   Has bandaged at the inferior left femur site- looks c/d/i.  No swelling or warmth.      Lab Results: No results found for this or any previous visit (from the past 24 hour(s)).  Micro Results: Recent Results (from the past 240 hour(s))  Surgical pcr screen     Status: None   Collection Time: 12/08/15  9:45 PM  Result Value Ref Range Status   MRSA, PCR NEGATIVE NEGATIVE Final   Staphylococcus aureus NEGATIVE NEGATIVE Final    Comment:        The Xpert SA Assay (FDA approved for NASAL specimens in patients over 43 years of age), is one component of a comprehensive surveillance program.  Test performance has been validated by [facility] for patients greater than or equal to 48 year old. It is not intended to diagnose infection nor to guide or monitor treatment.    Studies/Results: Dg C-arm 1-60 Min  12/09/2015  CLINICAL DATA:  55 year old male undergoing ORIF for pathologic fracture of the left femoral midshaft. Right lower lobe lung mass. Initial encounter. EXAM: LEFT FEMUR 2 VIEWS; DG C-ARM 61-120 MIN  COMPARISON:  Preoperative left femur series 5617. Chest CT 12/06/2015. FLUOROSCOPY TIME:  2 minutes 30 seconds FINDINGS: Six intraoperative fluoroscopic images of the left femur a demonstrating intra medullary rod placement with proximal interlocking cannulated and distal interlocking cortical screws. Near anatomic alignment now about the pathologic transverse comminuted midshaft fracture. IMPRESSION: ORIF of the left femur midshaft pathologic fracture with no adverse features. Electronically Signed   By: Genevie Ann M.D.   On: 12/09/2015 15:55   Dg Femur Min 2 Views Left  12/09/2015  CLINICAL DATA:  55 year old male undergoing ORIF for pathologic fracture of the left femoral midshaft. Right lower lobe lung mass. Initial encounter. EXAM: LEFT FEMUR 2 VIEWS; DG C-ARM 61-120 MIN COMPARISON:  Preoperative left femur series 5617. Chest CT 12/06/2015. FLUOROSCOPY TIME:  2 minutes 30 seconds FINDINGS: Six intraoperative fluoroscopic images of the left femur a demonstrating intra medullary rod placement with proximal interlocking cannulated and distal interlocking cortical screws. Near anatomic alignment now about the pathologic transverse comminuted midshaft fracture. IMPRESSION: ORIF of the left femur midshaft pathologic fracture with no adverse features. Electronically Signed   By: Genevie Ann M.D.   On: 12/09/2015 15:55   Medications: I have reviewed the patient's current medications. Scheduled Meds: . docusate sodium  100 mg Oral BID   Continuous Infusions: . sodium chloride 75 mL/hr at 12/11/15 0342  . lactated ringers 50 mL/hr at 12/09/15 1350   PRN Meds:.acetaminophen **OR** acetaminophen, alum & mag hydroxide-simeth, HYDROmorphone (DILAUDID) injection, menthol-cetylpyridinium **OR** phenol, methocarbamol **OR** methocarbamol (ROBAXIN)  IV, metoCLOPramide **OR** metoCLOPramide (REGLAN) injection, ondansetron **OR** ondansetron (ZOFRAN) IV, oxyCODONE, promethazine Assessment/Plan: Active Problems:   Femur  fracture (HCC)   Femur fracture, left (HCC)   Closed fracture of shaft of left femur (HCC)   Swelling of right testicle   Fracture   Mass of soft tissue   Pain  Metastatic cancer, differentials include primary lung, sarcoma, 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 Further testing is pending and pt will be set up at the [facility]- following and appreciate recs -Orthopedics following -chaplain support     Pathologic left femur fracture: in the setting of metastatic cancer. Left femoral neck lytic lesion. Pt is POD#2 s/p fixation of left femur and pain well controlled.  -orthopaedics following  -Oxycodone 5-10 mg q4 hours PRN     DVT Ppx: Pt is on lovenox 40 mg daily  Diet- reg  Dispo: Disposition is deferred at this time, awaiting improvement of current medical problems.  Anticipated discharge in approximately 3 day(s).   The patient does have a current PCP (No Pcp Per Patient) and does not need an [facility] hospital follow-up appointment after discharge.  The patient does not have transportation limitations that hinder transportation to clinic appointments.  .Services Needed at time of discharge: Y = Yes, Blank = No PT:   OT:   RN:   Equipment:   Other:     LOS: 5 days   Burgess Estelle, MD 12/11/2015, 11:52 AM

## 2015-12-11 NOTE — Progress Notes (Signed)
Subjective: 2 Days Post-Op Procedure(s) (LRB): INTRAMEDULLARY (IM) NAIL LEFT FEMUR (Left) Patient reports pain as moderate.  Therapy is recommending rehab.  Otherwise surgically stable.  Objective: Vital signs in last 24 hours: Temp:  [98.8 F (37.1 C)-100.8 F (38.2 C)] 98.8 F (37.1 C) (05/11 0624) Pulse Rate:  [79-81] 79 (05/11 0624) Resp:  [12-15] 12 (05/11 0800) BP: (108-123)/(56-67) 123/67 mmHg (05/11 0624) SpO2:  [99 %-100 %] 99 % (05/11 0800)  Intake/Output from previous day: 05/10 0701 - 05/11 0700 In: 480 [P.O.:480] Out: 400 [Urine:400] Intake/Output this shift:     Recent Labs  12/09/15 0714 12/10/15 0306  HGB 10.5* 9.4*    Recent Labs  12/09/15 0714 12/10/15 0306  WBC 5.4 6.5  RBC 3.08* 2.90*  HCT 31.4* 28.8*  PLT 290 316    Recent Labs  12/09/15 0714 12/10/15 0306  NA 133* 133*  K 4.0 3.9  CL 96* 97*  CO2 26 26  BUN 16 11  CREATININE 0.80 0.66  GLUCOSE 97 127*  CALCIUM 8.2* 8.1*   No results for input(s): LABPT, INR in the last 72 hours.  Sensation intact distally Intact pulses distally Dorsiflexion/Plantar flexion intact Incision: scant drainage No cellulitis present Compartment soft  Assessment/Plan: 2 Days Post-Op Procedure(s) (LRB): INTRAMEDULLARY (IM) NAIL LEFT FEMUR (Left) Up with therapy - WBAT left femur.  [place] for rehab from [facility] 12/11/2015, 9:45 AM

## 2015-12-12 ENCOUNTER — Inpatient Hospital Stay (HOSPITAL_COMMUNITY): Payer: BLUE CROSS/BLUE SHIELD

## 2015-12-12 MED ORDER — HYDROMORPHONE HCL 1 MG/ML IJ SOLN: 2.0000 mg | INTRAMUSCULAR | Status: DC | PRN

## 2015-12-12 MED ORDER — TECHNETIUM TC 99M MEDRONATE IV KIT
25.0000 | PACK | Freq: Once | INTRAVENOUS | Status: AC | PRN
  Administered 2015-12-12: 25 via INTRAVENOUS

## 2015-12-12 MED ORDER — OXYCODONE HCL ER 20 MG PO T12A
20.0000 mg | EXTENDED_RELEASE_TABLET | Freq: Two times a day (BID) | ORAL | Status: DC
  Administered 2015-12-12 – 2015-12-13 (×3): 20 mg via ORAL
  Filled 2015-12-12 (×3): qty 1

## 2015-12-12 MED ORDER — HYDROMORPHONE HCL 1 MG/ML IJ SOLN
2.0000 mg | INTRAMUSCULAR | Status: DC | PRN
Start: 2015-12-12 — End: 2015-12-12
  Administered 2015-12-12: 2 mg via INTRAVENOUS
  Filled 2015-12-12: qty 2

## 2015-12-12 MED ORDER — KETOROLAC TROMETHAMINE 15 MG/ML IJ SOLN
15.0000 mg | Freq: Four times a day (QID) | INTRAMUSCULAR | Status: DC
  Administered 2015-12-12 – 2015-12-16 (×15): 15 mg via INTRAVENOUS
  Filled 2015-12-12 (×15): qty 1

## 2015-12-12 MED ORDER — ACETAMINOPHEN 500 MG PO TABS
1000.0000 mg | ORAL_TABLET | Freq: Four times a day (QID) | ORAL | Status: DC
  Administered 2015-12-12 – 2015-12-16 (×13): 1000 mg via ORAL
  Filled 2015-12-12 (×15): qty 2

## 2015-12-12 NOTE — Progress Notes (Signed)
OT Cancellation Note  Patient Details Name: Marvin Hardy MRN: [id number] DOB: 1960-09-23   Cancelled Treatment:    Reason Eval/Treat Not Completed: Pain limiting ability to participate Pt declined x 2, stating to painful. Recommend Palliative Consult for pain control to facilitate optimal rehabilitation and for emotional support. Would benefit from rehab when pain controlled. [place], OTR/L  [phone] 12/12/2015 12/12/2015, 3:15 PM

## 2015-12-12 NOTE — Progress Notes (Signed)
Subjective:   No acute events overnight.  Had some nausea. PCA pump was d/ced yesterday- but pt is continuing to have significant pain     Objective: Vital signs in last 24 hours: Filed Vitals:   12/11/15 1200 12/11/15 1438 12/11/15 2026 12/12/15 0401  BP:  113/63 118/59 106/66  Pulse:  82 79 73  Temp:  98.7 F (37.1 C) 99.6 F (37.6 C) 99.4 F (37.4 C)  TempSrc:  Oral Oral Oral  Resp: '12 17 18 16  '$ Height:      Weight:      SpO2: 100% 100% 95% 97%   Weight change:   Intake/Output Summary (Last 24 hours) at 12/12/15 0742 Last data filed at 12/11/15 2132  Gross per 24 hour  Intake    960 ml  Output   1225 ml  Net   -265 ml    General: Vital signs reviewed. Patient sleepy Cardiovascular: regular rate, rhythm, no murmurs Pulmonary/Chest: CTAB Abdominal: Soft, non-tender, non-distended, BS + Extremities:   Has bandaged at the inferior left femur site- looks c/d/i.  No swelling or warmth.     Left knee- no warmth or effusion Rt knee normal     Lab Results: No results found for this or any previous visit (from the past 24 hour(s)).  Micro Results: Recent Results (from the past 240 hour(s))  Surgical pcr screen     Status: None   Collection Time: 12/08/15  9:45 PM  Result Value Ref Range Status   MRSA, PCR NEGATIVE NEGATIVE Final   Staphylococcus aureus NEGATIVE NEGATIVE Final    Comment:        The Xpert SA Assay (FDA approved for NASAL specimens in patients over 41 years of age), is one component of a comprehensive surveillance program.  Test performance has been validated by [facility] for patients greater than or equal to 37 year old. It is not intended to diagnose infection nor to guide or monitor treatment.    Studies/Results: No results found. Medications: I have reviewed the patient's current medications. Scheduled Meds: . docusate sodium  100 mg Oral BID  . oxyCODONE  20 mg Oral Q12H   Continuous Infusions: . sodium chloride 75 mL/hr  at 12/11/15 0342  . lactated ringers 50 mL/hr at 12/09/15 1350   PRN Meds:.acetaminophen **OR** acetaminophen, alum & mag hydroxide-simeth, HYDROmorphone (DILAUDID) injection, HYDROmorphone (DILAUDID) injection, menthol-cetylpyridinium **OR** phenol, methocarbamol **OR** methocarbamol (ROBAXIN)  IV, metoCLOPramide **OR** metoCLOPramide (REGLAN) injection, ondansetron **OR** ondansetron (ZOFRAN) IV, oxyCODONE, promethazine Assessment/Plan: Active Problems:   Femur fracture (HCC)   Femur fracture, left (HCC)   Closed fracture of shaft of left femur (HCC)   Swelling of right testicle   Fracture   Mass of soft tissue   Pain  Metastatic cancer, differentials include primary lung, sarcoma, 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 Further testing is pending and pt will be set up at the [facility]. Pt has been evaluated by PM&R who recommends inpatient rehab. OT recommends palliative care consult.   Pt is ok for discharge to Rehab from orthopaedics standpoint.  -Consulted Oncology- following and appreciate recs, set up outpt appt  -NM bone survey  -consider palliative care consult for symptom management if pain not well controlled next few days    Pathologic left femur fracture: in the setting of metastatic cancer. Left femoral neck lytic  lesion. Pt is POD#3 s/p fixation of left femur and pain still present . PT recommending CIR. He was evaluated by PM&R. Will obtain NM bone survey. Pt is ok for discharge to rehab from orthopedics standpoint.    -Oxycodone 5-10 mg q4 hours PRN  -IV dilaudid 204 mg q4 hours -Oxycontin 20 mg BID -NM bone survey pending    DVT Ppx: Pt is on lovenox 40 mg daily  Diet- reg  Dispo: Disposition is deferred at this time, awaiting improvement of current medical problems.   Anticipated discharge in approximately 3 day(s).   The patient does have a current PCP (No Pcp Per Patient) and does not need an [facility] hospital follow-up appointment after discharge.  The patient does not have transportation limitations that hinder transportation to clinic appointments.  .Services Needed at time of discharge: Y = Yes, Blank = No PT:   OT:   RN:   Equipment:   Other:     LOS: 6 days   Marvin Estelle, MD 12/12/2015, 7:42 AM

## 2015-12-12 NOTE — Progress Notes (Signed)
Subjective: Mr. Lindfors had no acute events overnight. He is describing a lot of pain around the fracture repair. He says that his oral pain medication isn't controlling his pain adequately.   Objective: Vital signs in last 24 hours: Filed Vitals:   12/11/15 1200 12/11/15 1438 12/11/15 2026 12/12/15 0401  BP:  113/63 118/59 106/66  Pulse:  82 79 73  Temp:  98.7 F (37.1 C) 99.6 F (37.6 C) 99.4 F (37.4 C)  TempSrc:  Oral Oral Oral  Resp: '12 17 18 16  '$ Height:      Weight:      SpO2: 100% 100% 95% 97%   Weight change:   Intake/Output Summary (Last 24 hours) at 12/12/15 1049 Last data filed at 12/11/15 2132  Gross per 24 hour  Intake    480 ml  Output    825 ml  Net   -345 ml   Physical exam BP 106/66 mmHg  Pulse 73  Temp(Src) 99.4 F (37.4 C) (Oral)  Resp 16  Ht '6\' 6"'$  (1.981 m)  Wt 73.211 kg (161 lb 6.4 oz)  BMI 18.66 kg/m2  SpO2 97% General appearance: alert and cooperative Lungs: clear to auscultation bilaterally Heart: regular rate and rhythm, S1, S2 normal, no murmur, click, rub or gallop  Extremities: swelling in left thigh and knee Lab Results: CBC    Component Value Date/Time   WBC 6.5 12/10/2015 0306   RBC 2.90* 12/10/2015 0306   RBC 3.45* 12/07/2015 0319   HGB 9.4* 12/10/2015 0306   HCT 28.8* 12/10/2015 0306   PLT 316 12/10/2015 0306   MCV 99.3 12/10/2015 0306   MCH 32.4 12/10/2015 0306   MCHC 32.6 12/10/2015 0306   RDW 13.0 12/10/2015 0306   BMP Latest Ref Rng 12/10/2015 12/09/2015 12/08/2015  Glucose 65 - 99 mg/dL 127(H) 97 94  BUN 6 - 20 mg/dL '11 16 15  '$ Creatinine 0.61 - 1.24 mg/dL 0.66 0.80 0.67  Sodium 135 - 145 mmol/L 133(L) 133(L) 134(L)  Potassium 3.5 - 5.1 mmol/L 3.9 4.0 3.9  Chloride 101 - 111 mmol/L 97(L) 96(L) 100(L)  CO2 22 - 32 mmol/L '26 26 25  '$ Calcium 8.9 - 10.3 mg/dL 8.1(L) 8.2(L) 8.2(L)    Micro Results: Recent Results (from the past 240 hour(s))  Surgical pcr screen     Status: None   Collection Time: 12/08/15  9:45 PM    Result Value Ref Range Status   MRSA, PCR NEGATIVE NEGATIVE Final   Staphylococcus aureus NEGATIVE NEGATIVE Final    Comment:        The Xpert SA Assay (FDA approved for NASAL specimens in patients over 28 years of age), is one component of a comprehensive surveillance program.  Test performance has been validated by [facility] for patients greater than or equal to 23 year old. It is not intended to diagnose infection nor to guide or monitor treatment.    Studies/Results: No results found. Medications: I have reviewed the patient's current medications. Scheduled Meds: . docusate sodium  100 mg Oral BID  . oxyCODONE  20 mg Oral Q12H   Continuous Infusions: . sodium chloride 75 mL/hr at 12/11/15 0342  . lactated ringers 50 mL/hr at 12/09/15 1350   PRN Meds:.acetaminophen **OR** acetaminophen, alum & mag hydroxide-simeth, HYDROmorphone (DILAUDID) injection, HYDROmorphone (DILAUDID) injection, menthol-cetylpyridinium **OR** phenol, methocarbamol **OR** methocarbamol (ROBAXIN)  IV, metoCLOPramide **OR** metoCLOPramide (REGLAN) injection, ondansetron **OR** ondansetron (ZOFRAN) IV, oxyCODONE, promethazine Mr. Sedler is a 55 yo previously healthy man who presented to the  ED with a pathological fracture of his left femur who was found to have multiple tumors involving the left femur, bilateral adrenals and left axillary chest wall.   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 Xray showed transverse displacement to the mid femoral diaphysis with irregular lytic lesions and periosteal reaction involving the mid femoral diaphysis at the fracture site. This is concerning for either a primary bone cancer or metastatic disease. CT of the femur showed a pathologic fracture at the mid femoral diaphysis with permeative bone changes in the cortex. Additionally,  there was abnormal soft tissue within the medullary space that extended into the fracture site. This is concerning for an osteosarcoma or metastatic disease. CT of chest, abdomen and pelvis revealed a right lower lung mass, possibly tumor. There were also bilateral adrenal masses found. There was an additional suprarenal mass found. On exam, a large, firm, nontender, fixated mass was found on the left chest wall near the axillary line that has been present for about 3 months.  -chest biopsy: undifferentiated adenocarcinoma  -femoral shaft biopsy: poorly differentiated metastatic carcinoma -appreciate oncology recs  -pain management: PCA dilaudid was stopped 5/11. Oral oxycodone not managing pain adequately. Will add scheduled IV dilaudid as well as scheduled PO oxycontin -will go to inpatient rehab today or tomorrow -f/u with Dr. Earlie Server for [place] appointment at cancer hospital  Right swollen testicle  Right testicle swollen on exam. CT of femur showing right-sided hydrocele versus scrotal lesion. PSA is 0.86. -scrotal ultrasound showed hydrocele   This is a Careers information officer Note.  The care of the patient was discussed with Dr. Lorie Phenix and the assessment and plan formulated with their assistance.  Please see their attached note for official documentation of the daily encounter.   LOS: 6 days   Wandra Mannan, Med Student 12/12/2015, 10:49 AM

## 2015-12-12 NOTE — Progress Notes (Signed)
Spoke with internal medicine resident, will be reimaging left lower extremity in light of pain as well as knee effusion. If x-ray shows no new findings, would be medically ready for rehabilitation tomorrow, still having significant pain issues

## 2015-12-12 NOTE — Progress Notes (Signed)
   12/12/15 1438  PT Visit Information  Last PT Received On 12/12/15  Assistance Needed +2  Reason Eval/Treat Not Completed Other (comment) (Pt appeared depressed on arival and refused PT intervention.  Pt reports,"I will try and start tomorrow.")  History of Present Illness Pt is a 55 yo M with no significant past medical history presenting to the hospital with pain in his left lower extremity. Patient states he was at work and twisted his left leg while trying to turn while walking down a step. States he heard his "bone pop" when this happened. Denies having any history of fractures in the past. 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 He is now s/p IM nailing and is WBAT.  Governor Rooks, PTA pager [phone]

## 2015-12-13 ENCOUNTER — Inpatient Hospital Stay (HOSPITAL_COMMUNITY): Payer: BLUE CROSS/BLUE SHIELD

## 2015-12-13 DIAGNOSIS — M25469 Effusion, unspecified knee: Secondary | ICD-10-CM | POA: Insufficient documentation

## 2015-12-13 MED ORDER — POLYETHYLENE GLYCOL 3350 17 G PO PACK
17.0000 g | PACK | Freq: Every day | ORAL | Status: DC
  Administered 2015-12-13 – 2015-12-16 (×4): 17 g via ORAL
  Filled 2015-12-13 (×3): qty 1

## 2015-12-13 MED ORDER — OXYCODONE HCL 5 MG PO TABS
10.0000 mg | ORAL_TABLET | ORAL | Status: DC | PRN
  Administered 2015-12-13 – 2015-12-14 (×5): 15 mg via ORAL
  Administered 2015-12-15 (×2): 10 mg via ORAL
  Administered 2015-12-16 (×2): 15 mg via ORAL
  Filled 2015-12-13: qty 3
  Filled 2015-12-13: qty 2
  Filled 2015-12-13: qty 3
  Filled 2015-12-13: qty 2
  Filled 2015-12-13 (×5): qty 3

## 2015-12-13 MED ORDER — OXYCODONE HCL ER 40 MG PO T12A
40.0000 mg | EXTENDED_RELEASE_TABLET | Freq: Two times a day (BID) | ORAL | Status: DC
  Administered 2015-12-13 – 2015-12-16 (×6): 40 mg via ORAL
  Filled 2015-12-13 (×6): qty 1

## 2015-12-13 MED ORDER — SENNOSIDES-DOCUSATE SODIUM 8.6-50 MG PO TABS
1.0000 | ORAL_TABLET | Freq: Every day | ORAL | Status: DC
  Administered 2015-12-13 – 2015-12-15 (×3): 1 via ORAL
  Filled 2015-12-13 (×3): qty 1

## 2015-12-13 NOTE — Progress Notes (Signed)
Subjective: No acute events overnight. He is in good spirits. He reports his pain is improved. He has not had a bowel movement.  Objective: Vital signs in last 24 hours: Filed Vitals:   12/12/15 0401 12/12/15 1320 12/12/15 2052 12/13/15 0407  BP: 106/66 121/70 109/53 103/55  Pulse: 73 80 69 69  Temp: 99.4 F (37.4 C) 99.1 F (37.3 C) 98.2 F (36.8 C) 98 F (36.7 C)  TempSrc: Oral Oral Oral Oral  Resp: '16 18 18 16  '$ Height:      Weight:      SpO2: 97% 97% 100% 97%   Weight change:   Intake/Output Summary (Last 24 hours) at 12/13/15 1206 Last data filed at 12/13/15 1052  Gross per 24 hour  Intake    755 ml  Output    600 ml  Net    155 ml   General Apperance: NAD HEENT: Normocephalic, atraumatic, anicteric sclera Neck: Supple, trachea midline Lungs: Clear to auscultation bilaterally. No wheezes, rhonchi or rales. Breathing comfortably Heart: Regular rate and rhythm, no murmur/rub/gallop Abdomen: Soft, nontender, nondistended, no rebound/guarding Extremities: Warm and well perfused Skin: No rashes Neurologic: Alert and interactive. No gross deficits.  Lab Results: Basic Metabolic Panel:  Recent Labs Lab 12/09/15 0714 12/10/15 0306  NA 133* 133*  K 4.0 3.9  CL 96* 97*  CO2 26 26  GLUCOSE 97 127*  BUN 16 11  CREATININE 0.80 0.66  CALCIUM 8.2* 8.1*   Liver Function Tests:  Recent Labs Lab 12/06/15 1909  AST 14*  ALT 8*  ALKPHOS 71  BILITOT 0.2*  PROT 6.1*  ALBUMIN 2.9*   CBC:  Recent Labs Lab 12/09/15 0714 12/10/15 0306  WBC 5.4 6.5  HGB 10.5* 9.4*  HCT 31.4* 28.8*  MCV 101.9* 99.3  PLT 290 316   Coagulation:  Recent Labs Lab 12/07/15 1309 12/08/15 0221  LABPROT 14.2 14.2  INR 1.08 1.08   Anemia Panel:  Recent Labs Lab 12/07/15 0319  VITAMINB12 889  FOLATE 15.4  FERRITIN 179  TIBC 225*  IRON 75  RETICCTPCT 1.4   Studies/Results: Nm Bone Scan Whole Body  12/12/2015  CLINICAL DATA:  Pathologic left femur fracture. EXAM:  NUCLEAR MEDICINE WHOLE BODY BONE SCAN TECHNIQUE: Whole body anterior and posterior images were obtained approximately 3 hours after intravenous injection of radiopharmaceutical. RADIOPHARMACEUTICALS:  26 mCi Technetium-49mMDP IV COMPARISON:  Body CT 12/06/2015 FINDINGS: Activity in the mid left femur related to known pathologic fracture. Less intense activity at the left femoral neck and intertrochanteric femur related to discrete metastases seen by CT. Status post left femur ORIF. No suspicious activity elsewhere for osseous metastatic disease. Normal tracer distribution with bilateral renal and bladder activity seen. IMPRESSION: Abnormal activity in the proximal and mid left femur related to known metastases and pathologic diaphysis fracture. No evidence of skeletal metastasis elsewhere. Electronically Signed   By: JMonte FantasiaM.D.   On: 12/12/2015 16:57   Medications: I have reviewed the patient's current medications. Scheduled Meds: . acetaminophen  1,000 mg Oral Q6H  . docusate sodium  100 mg Oral BID  . ketorolac  15 mg Intravenous Q6H  . oxyCODONE  20 mg Oral Q12H   Continuous Infusions:  PRN Meds:.alum & mag hydroxide-simeth, HYDROmorphone (DILAUDID) injection, menthol-cetylpyridinium **OR** phenol, methocarbamol **OR** [DISCONTINUED] methocarbamol (ROBAXIN)  IV, ondansetron **OR** ondansetron (ZOFRAN) IV, oxyCODONE Assessment/Plan: 55year old man who was otherwise healthy, presented to the ED after sustaining a non-traumatic left femoral shaft fracture.  Metastatic cancer: Biopsy of  soft tissue and surgical pathology shows poorly differentiated adenocarcinoma. Further testing is pending. Bone scan with no skeletal mets elsewhere.  -Oncology following, appreciate recommendations. He will need an outpatient follow up with oncology.  Pathologic left femur fracture: s/p IM nail of left femur. PT recommending CIR. -Increase Oxycodone to 10-15 mg q4 hours PRN  -IV dilaudid 2-4 mg q4  hours PRN -Increase Oxycontin to 40 mg BID -Scheduled Tylenol '1000mg'$  Q6hr and Toradol '15mg'$  Q6hr. -Increase bowel regimen. Now on Docusate '100mg'$  BID, Miralax daily, Senokot-S QHS. -Left knee xray  VTE ppx: Lovenox Diet: Reg  Dispo: Awaiting CIR.  Anticipated discharge in approximately 1-2 day(s).   The patient does not have a current PCP (No Pcp Per Patient) and does need an [facility] hospital follow-up appointment after discharge.  The patient does not know have transportation limitations that hinder transportation to clinic appointments.  .Services Needed at time of discharge: Y = Yes, Blank = No PT:   OT:   RN:   Equipment:   Other:     LOS: 7 days   Milagros Loll, MD 12/13/2015, 12:06 PM

## 2015-12-13 NOTE — Progress Notes (Signed)
Physical Therapy Treatment Patient Details Name: Marvin Hardy MRN: [id number] DOB: 1960/12/02 Today's Date: 12/13/2015    History of Present Illness Pt is a 55 yo M with no significant past medical history presenting to the hospital with pain in his left lower extremity. Patient states he was at work and twisted his left leg while trying to turn while walking down a step. States he heard his "bone pop" when this happened. Denies having any history of fractures in the past. 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 He is now s/p IM nailing and is WBAT.    PT Comments    POD # 4 Pt progressing slowly and will need In Pt Rehab Assisted OOB to amb a greater distance then performed some TE's.  Followed by elevation and ICE.    Follow Up Recommendations  CIR     Equipment Recommendations  Rolling walker with 5" wheels;3in1 (PT)    Recommendations for Other Services       Precautions / Restrictions Precautions Precautions: Fall Restrictions Weight Bearing Restrictions: No LLE Weight Bearing: Weight bearing as tolerated    Mobility  Bed Mobility Overal bed mobility: Needs Assistance Bed Mobility: Supine to Sit     Supine to sit: Mod assist     General bed mobility comments: assist for L LE and increased time  Transfers Overall transfer level: Needs assistance Equipment used: Rolling walker (2 wheeled) Transfers: Sit to/from Stand Sit to Stand: Min assist;Mod assist;+2 safety/equipment         General transfer comment: 50% VC's on proper hand placem,ent and increased time to rise and sit due to "tightness"  and pain level  Ambulation/Gait Ambulation/Gait assistance: Min assist;+2 safety/equipment Ambulation Distance (Feet): 55 Feet Assistive device: Rolling walker (2 wheeled)   Gait velocity: decreased   General Gait Details: initially unable to advance L LE required 25% assist.  Increased ability with  increased gait distance.  Max c/o swelling and pain esp during L LE advancement.     Stairs            Wheelchair Mobility    Modified Rankin (Stroke Patients Only)       Balance                                    Cognition Arousal/Alertness: Awake/alert Behavior During Therapy: WFL for tasks assessed/performed Overall Cognitive Status: Within Functional Limits for tasks assessed                      Exercises  B LE AP  B LE knee presses  L LE AAROM ABd/ADd    General Comments        Pertinent Vitals/Pain Pain Assessment: 0-10 Pain Score: 8  Pain Location: L thigh Pain Descriptors / Indicators: Tightness;Tender Pain Intervention(s): Monitored during session;Premedicated before session;Repositioned;Ice applied    Home Living                      Prior Function            PT Goals (current goals can now be found in the care plan section) Progress towards PT goals: Progressing toward goals    Frequency  Min 5X/week    PT Plan Current plan remains appropriate    Co-evaluation  End of Session Equipment Utilized During Treatment: Gait belt Activity Tolerance: Patient limited by pain Patient left: in chair;with call bell/phone within reach     Time: 0847-0912 PT Time Calculation (min) (ACUTE ONLY): 25 min  Charges:  $Gait Training: 8-22 mins $Therapeutic Activity: 8-22 mins                    G Codes:      Rica Koyanagi  PTA [facility]  Acute  Rehab Pager      [phone]

## 2015-12-14 DIAGNOSIS — M25462 Effusion, left knee: Secondary | ICD-10-CM

## 2015-12-14 DIAGNOSIS — K5900 Constipation, unspecified: Secondary | ICD-10-CM

## 2015-12-14 MED ORDER — BISACODYL 10 MG RE SUPP
10.0000 mg | Freq: Every day | RECTAL | Status: DC
  Administered 2015-12-15: 10 mg via RECTAL
  Filled 2015-12-14 (×2): qty 1

## 2015-12-14 NOTE — Progress Notes (Signed)
Subjective:  Doing well this morning. Pain much better controlled. Was able to work with PT for rehab. Denies nausea or vomiting, able to tolerate reg diet. Still denies bowel movement, and open to having a suppository to help.      Objective: Vital signs in last 24 hours: Filed Vitals:   12/12/15 2052 12/13/15 0407 12/13/15 2034 12/14/15 0457  BP: 109/53 103/55 107/60 100/56  Pulse: 69 69 75 63  Temp: 98.2 F (36.8 C) 98 F (36.7 C) 98.3 F (36.8 C) 97.5 F (36.4 C)  TempSrc: Oral Oral Oral Oral  Resp: '18 16 16 '$ 166  Height:      Weight:      SpO2: 100% 97% 96% 97%   Weight change:   Intake/Output Summary (Last 24 hours) at 12/14/15 0904 Last data filed at 12/13/15 1052  Gross per 24 hour  Intake      0 ml  Output      0 ml  Net      0 ml    General: Vital signs reviewed. Patient in NAD Cardiovascular: regular rate, rhythm, no murmurs Pulmonary/Chest: CTAB Abdominal: Soft, non-tender, non-distended, BS + Extremities:   Has bandaged at the inferior left femur site- looks c/d/i.  No swelling or warmth in either knee  Rt knee normal     Lab Results: No results found for this or any previous visit (from the past 24 hour(s)).  Micro Results: Recent Results (from the past 240 hour(s))  Surgical pcr screen     Status: None   Collection Time: 12/08/15  9:45 PM  Result Value Ref Range Status   MRSA, PCR NEGATIVE NEGATIVE Final   Staphylococcus aureus NEGATIVE NEGATIVE Final    Comment:        The Xpert SA Assay (FDA approved for NASAL specimens in patients over 70 years of age), is one component of a comprehensive surveillance program.  Test performance has been validated by [facility] for patients greater than or equal to 53 year old. It is not intended to diagnose infection nor to guide or monitor treatment.    Studies/Results: Dg Knee 1-2 Views Left  12/13/2015  CLINICAL DATA:  Pt reports tightness in left knee x 2 days after sitting up for a  while after doing therapy; swelling to the left knee also but pt reports it was there prior to surgery and appears to have decreased; pt had a left IM nail. EXAM: LEFT KNEE - 1-2 VIEW COMPARISON:  None available FINDINGS: Lower portion of an intra medullary nail is identified in the distal femur. Surgical clips overlie the distal medial thigh. No acute fracture or subluxation identified. Small joint effusion suspected. IMPRESSION: Status post ORIF.  Small joint effusion. Electronically Signed   By: Nolon Nations M.D.   On: 12/13/2015 15:02   Nm Bone Scan Whole Body  12/12/2015  CLINICAL DATA:  Pathologic left femur fracture. EXAM: NUCLEAR MEDICINE WHOLE BODY BONE SCAN TECHNIQUE: Whole body anterior and posterior images were obtained approximately 3 hours after intravenous injection of radiopharmaceutical. RADIOPHARMACEUTICALS:  26 mCi Technetium-60mMDP IV COMPARISON:  Body CT 12/06/2015 FINDINGS: Activity in the mid left femur related to known pathologic fracture. Less intense activity at the left femoral neck and intertrochanteric femur related to discrete metastases seen by CT. Status post left femur ORIF. No suspicious activity elsewhere for osseous metastatic disease. Normal tracer distribution with bilateral renal and bladder activity seen. IMPRESSION: Abnormal activity in the proximal and mid left femur related  to known metastases and pathologic diaphysis fracture. No evidence of skeletal metastasis elsewhere. Electronically Signed   By: Monte Fantasia M.D.   On: 12/12/2015 16:57   Medications: I have reviewed the patient's current medications. Scheduled Meds: . acetaminophen  1,000 mg Oral Q6H  . bisacodyl  10 mg Rectal Daily  . docusate sodium  100 mg Oral BID  . ketorolac  15 mg Intravenous Q6H  . oxyCODONE  40 mg Oral Q12H  . polyethylene glycol  17 g Oral Daily  . senna-docusate  1 tablet Oral QHS   Continuous Infusions:   PRN Meds:.alum & mag hydroxide-simeth, HYDROmorphone  (DILAUDID) injection, menthol-cetylpyridinium **OR** phenol, methocarbamol **OR** [DISCONTINUED] methocarbamol (ROBAXIN)  IV, ondansetron **OR** ondansetron (ZOFRAN) IV, oxyCODONE Assessment/Plan: Active Problems:   Femur fracture (HCC)   Femur fracture, left (HCC)   Closed fracture of shaft of left femur (HCC)   Swelling of right testicle   Fracture   Mass of soft tissue   Pain   Effusion of knee  Metastatic cancer:  Patient underwent a CT guided biopsy of the several soft tissue masses. 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 Further testing is pending and pt [facility]. Pt has been evaluated by PM&R who recommends inpatient rehab. Pt's post-op pain is much better controlled after we increased his long-acting narcotic dose. Bone scan shows no additional bone involvement. Left knee xray shows a small effusion but otherwise s/p ORIF.  Pt is ok for discharge to Rehab from orthopaedics standpoint and we appreciate PM&R help.  -Consulted Oncology- following and appreciate recs, set up outpt appt    Pathologic left femur fracture:  s/p IM nail of left femur. PT recommending CIR   -Oxycodone to 10-15 mg q4 hours PRN  -IV dilaudid 2-4 mg q4 hours PRN -Oxycontin to 40 mg BID -Scheduled Tylenol '1000mg'$  Q6hr and Toradol '15mg'$  Q6hr. -Increase bowel regimen. Now on Docusate '100mg'$  BID, Miralax daily, Senokot-S QHS.  Constipation: Pt willing tot ry dulcolax suppository today  -docusate 100 mg bid -miralax daily -senokot QHS -dulcolax suppository    DVT Ppx: Pt is on lovenox 40 mg daily  Diet- reg  Dispo: Disposition is deferred at this time, awaiting improvement of current medical problems.  Anticipated discharge in approximately 3 day(s).   The patient does have a current PCP (No Pcp Per Patient) and does not need an [facility] hospital follow-up appointment after discharge.  The patient does not have transportation limitations  that hinder transportation to clinic appointments.  .Services Needed at time of discharge: Y = Yes, Blank = No PT:   OT:   RN:   Equipment:   Other:     LOS: 8 days   Marvin Estelle, MD 12/14/2015, 9:04 AM

## 2015-12-14 NOTE — Progress Notes (Signed)
Subjective: Marvin Hardy has had no acute events overnight. He continues to have 8/10 pain. He says that his scheduled pain medications are working well. He still has not had a bowel movement.  Objective: Vital signs in last 24 hours: Filed Vitals:   12/12/15 2052 12/13/15 0407 12/13/15 2034 12/14/15 0457  BP: 109/53 103/55 107/60 100/56  Pulse: 69 69 75 63  Temp: 98.2 F (36.8 C) 98 F (36.7 C) 98.3 F (36.8 C) 97.5 F (36.4 C)  TempSrc: Oral Oral Oral Oral  Resp: '18 16 16 '$ 166  Height:      Weight:      SpO2: 100% 97% 96% 97%   Weight change:   Intake/Output Summary (Last 24 hours) at 12/14/15 1033 Last data filed at 12/13/15 1052  Gross per 24 hour  Intake      0 ml  Output      0 ml  Net      0 ml   BP 100/56 mmHg  Pulse 63  Temp(Src) 97.5 F (36.4 C) (Oral)  Resp 166  Ht '6\' 6"'$  (1.981 m)  Wt 73.211 kg (161 lb 6.4 oz)  BMI 18.66 kg/m2  SpO2 97% General appearance: alert and cooperative Lungs: clear to auscultation bilaterally Chest wall: no tenderness, mass on left posterior chest wall Heart: regular rate and rhythm, S1, S2 normal, no murmur, click, rub or gallop Abdomen: soft, non-tender; bowel sounds normal; no masses,  no organomegaly Extremities: bandage at left femur site, no swelling or warmth in knee   Lab Results: '@LABTEST2'$ @ Micro Results: Recent Results (from the past 240 hour(s))  Surgical pcr screen     Status: None   Collection Time: 12/08/15  9:45 PM  Result Value Ref Range Status   MRSA, PCR NEGATIVE NEGATIVE Final   Staphylococcus aureus NEGATIVE NEGATIVE Final    Comment:        The Xpert SA Assay (FDA approved for NASAL specimens in patients over 11 years of age), is one component of a comprehensive surveillance program.  Test performance has been validated by [facility] for patients greater than or equal to 2 year old. It is not intended to diagnose infection nor to guide or monitor treatment.    Studies/Results: Dg Knee 1-2  Views Left  12/13/2015  CLINICAL DATA:  Pt reports tightness in left knee x 2 days after sitting up for a while after doing therapy; swelling to the left knee also but pt reports it was there prior to surgery and appears to have decreased; pt had a left IM nail. EXAM: LEFT KNEE - 1-2 VIEW COMPARISON:  None available FINDINGS: Lower portion of an intra medullary nail is identified in the distal femur. Surgical clips overlie the distal medial thigh. No acute fracture or subluxation identified. Small joint effusion suspected. IMPRESSION: Status post ORIF.  Small joint effusion. Electronically Signed   By: Nolon Nations M.D.   On: 12/13/2015 15:02   Nm Bone Scan Whole Body  12/12/2015  CLINICAL DATA:  Pathologic left femur fracture. EXAM: NUCLEAR MEDICINE WHOLE BODY BONE SCAN TECHNIQUE: Whole body anterior and posterior images were obtained approximately 3 hours after intravenous injection of radiopharmaceutical. RADIOPHARMACEUTICALS:  26 mCi Technetium-22mMDP IV COMPARISON:  Body CT 12/06/2015 FINDINGS: Activity in the mid left femur related to known pathologic fracture. Less intense activity at the left femoral neck and intertrochanteric femur related to discrete metastases seen by CT. Status post left femur ORIF. No suspicious activity elsewhere for osseous metastatic disease. Normal  tracer distribution with bilateral renal and bladder activity seen. IMPRESSION: Abnormal activity in the proximal and mid left femur related to known metastases and pathologic diaphysis fracture. No evidence of skeletal metastasis elsewhere. Electronically Signed   By: Monte Fantasia M.D.   On: 12/12/2015 16:57   Medications: I have reviewed the patient's current medications. Scheduled Meds: . acetaminophen  1,000 mg Oral Q6H  . bisacodyl  10 mg Rectal Daily  . docusate sodium  100 mg Oral BID  . ketorolac  15 mg Intravenous Q6H  . oxyCODONE  40 mg Oral Q12H  . polyethylene glycol  17 g Oral Daily  . senna-docusate  1  tablet Oral QHS   Continuous Infusions:  PRN Meds:.alum & mag hydroxide-simeth, HYDROmorphone (DILAUDID) injection, menthol-cetylpyridinium **OR** phenol, methocarbamol **OR** [DISCONTINUED] methocarbamol (ROBAXIN)  IV, ondansetron **OR** ondansetron (ZOFRAN) IV, oxyCODONE  Assessment/Plan: Marvin Hardy is a 55 yo previously healthy man who presented to the ED with a pathological fracture of his left femur who was found to have multiple tumors involving the left femur, bilateral adrenals and left axillary chest wall.   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 Xray showed transverse displacement to the mid femoral diaphysis with irregular lytic lesions and periosteal reaction involving the mid femoral diaphysis at the fracture site. This is concerning for either a primary bone cancer or metastatic disease. CT of the femur showed a pathologic fracture at the mid femoral diaphysis with permeative bone changes in the cortex. Additionally, there was abnormal soft tissue within the medullary space that extended into the fracture site. This is concerning for an osteosarcoma or metastatic disease. CT of chest, abdomen and pelvis revealed a right lower lung mass, possibly tumor. There were also bilateral adrenal masses found. There was an additional suprarenal mass found. On exam, a large, firm, nontender, fixated mass was found on the left chest wall near the axillary line that has been present for about 3 months.  -chest biopsy: undifferentiated adenocarcinoma  -femoral shaft biopsy: poorly differentiated metastatic carcinoma -appreciate oncology recs  -pain management adequate with scheduled and prn oxycontin and toradol -f/u with PMR for transfer to inpatient rehab -f/u with Dr. Earlie Server for [place] appointment at cancer hospital  Right swollen testicle   Right testicle swollen on exam. CT of femur showing right-sided hydrocele versus scrotal lesion. PSA is 0.86. -scrotal ultrasound showed hydrocele   This is a Careers information officer Note.  The care of the patient was discussed with Dr. Tiburcio Pea and the assessment and plan formulated with their assistance.  Please see their attached note for official documentation of the daily encounter.   LOS: 8 days   Wandra Mannan, Med Student 12/14/2015, 10:33 AM

## 2015-12-15 DIAGNOSIS — C7971 Secondary malignant neoplasm of right adrenal gland: Secondary | ICD-10-CM

## 2015-12-15 DIAGNOSIS — C7972 Secondary malignant neoplasm of left adrenal gland: Secondary | ICD-10-CM

## 2015-12-15 DIAGNOSIS — C7801 Secondary malignant neoplasm of right lung: Secondary | ICD-10-CM

## 2015-12-15 DIAGNOSIS — C7989 Secondary malignant neoplasm of other specified sites: Secondary | ICD-10-CM

## 2015-12-15 MED ORDER — POLYETHYLENE GLYCOL 3350 17 G PO PACK: 17.0000 g | PACK | Freq: Every day | ORAL | Status: DC

## 2015-12-15 MED ORDER — SENNOSIDES-DOCUSATE SODIUM 8.6-50 MG PO TABS: 1.0000 | ORAL_TABLET | Freq: Every day | ORAL | Status: AC

## 2015-12-15 MED ORDER — MAGNESIUM CITRATE PO SOLN
1.0000 | Freq: Once | ORAL | Status: AC
  Administered 2015-12-15: 1 via ORAL
  Filled 2015-12-15: qty 296

## 2015-12-15 MED ORDER — ALUM & MAG HYDROXIDE-SIMETH 200-200-20 MG/5ML PO SUSP: 30.0000 mL | ORAL | Status: DC | PRN

## 2015-12-15 MED ORDER — BISACODYL 10 MG RE SUPP: 10.0000 mg | Freq: Every day | RECTAL | Status: DC

## 2015-12-15 MED ORDER — IBUPROFEN 800 MG PO TABS: 800.0000 mg | ORAL_TABLET | Freq: Three times a day (TID) | ORAL | Status: DC | PRN

## 2015-12-15 MED ORDER — OXYCODONE HCL 10 MG PO TABS: 10.0000 mg | ORAL_TABLET | ORAL | Status: DC | PRN

## 2015-12-15 MED ORDER — ONDANSETRON HCL 4 MG PO TABS: 4.0000 mg | ORAL_TABLET | Freq: Four times a day (QID) | ORAL | Status: DC | PRN

## 2015-12-15 MED ORDER — DOCUSATE SODIUM 100 MG PO CAPS: 100.0000 mg | ORAL_CAPSULE | Freq: Two times a day (BID) | ORAL | Status: AC

## 2015-12-15 MED ORDER — OXYCODONE HCL ER 40 MG PO T12A: 40.0000 mg | EXTENDED_RELEASE_TABLET | Freq: Two times a day (BID) | ORAL | Status: DC

## 2015-12-15 NOTE — Discharge Instructions (Signed)
Please follow up in the [place] at [facility]- they will contact you to make the appointment with Dr. Julien Nordmann. At that time, they will go over more details regarding the results and discuss further management.  Please continue the physical therapy Please take the senna and docusate as needed for constipation. Also prescribed dulcolax suppository. The pain medications can make you constipated so it is important for you to take these.  Orthopedic: Can get incisions wet daily in the shower and new dry dressing daily as needed. Full weight bearing as tolerated with assistance

## 2015-12-15 NOTE — H&P (Signed)
  Physical Medicine and Rehabilitation Admission H&P    Chief Complaint  Patient presents with  . Leg Pain  : HPI: Marvin Hardy is a 54 y.o. right handed male with history of tobacco abuse. Presented 12/06/2015 when he felt a pop in his left leg while twisting while at work. Denied any fall. Patient had been living with a friend and was independent prior to admission. He currently is unsure of his discharge plan he has no local family. X-rays and imaging revealed left displaced transverse fractures of the mid femoral diaphysis as well as irregular lytic lesion and periosteal reaction involving the mid femoral diaphysis at the fracture site concerning for possibility of metastatic disease or multiple myeloma. CT abdomen and pelvis as well as CT of the chest showed a 2.5 x 3.6 right lower lobe lung mass suspicious for neoplasm.Bilateral adrenal masses. 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  Lytic lesions of the femoral neck and intertrochanteric region But no evidence of skeletal metastasis elsewhere. 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 Follow-up oncology service of Dr. Mohammed and Plan follow-up outpatient with plan of care. Underwent intramedullary nail placement left femur 12/09/2015 per Dr. Blackman. Hospital course pain management. Acute blood loss anemia 9.4 and monitored. Weightbearing as tolerated. Physical therapy evaluation completed with recommendations of physical medicine rehabilitation consult.Patient was admitted for a comprehensive rehabilitation program  ROS Constitutional: Positive for weight loss. Negative for fever and chills.  HENT: Negative for hearing loss.  Eyes: Negative for blurred vision and double vision.  Respiratory: Positive for cough.  Cardiovascular: Negative for chest pain,  palpitations and leg swelling.  Gastrointestinal: Positive for constipation. Negative for vomiting.  Genitourinary: Positive for urgency. Negative for dysuria.  Musculoskeletal: Positive for myalgias and joint pain.  Skin: Negative for rash.  Neurological: Positive for seizures. Negative for headaches.  All other systems reviewed and are negative  History reviewed. No pertinent past medical history. Past Surgical History  Procedure Laterality Date  . Femur im nail Left 12/09/2015    Procedure: INTRAMEDULLARY (IM) NAIL LEFT FEMUR;  Surgeon: Christopher Y Blackman, MD;  Location: [facility] OR;  Service: Orthopedics;  Laterality: Left;   Family History  Problem Relation Age of Onset  . Hypertension Mother   . Heart disease Mother   . Diabetes Mother   . Lung cancer Father    Social History:  reports that he has been smoking Cigars.  He does not have any smokeless tobacco history on file. He reports that he drinks alcohol. He reports that he does not use illicit drugs. Allergies:  Allergies  Allergen Reactions  . Aleve [Naproxen Sodium] Itching        Medications Prior to Admission  Medication Sig Dispense Refill  . acetaminophen (TYLENOL) 500 MG tablet Take 1 tablet (500 mg total) by mouth every 6 (six) hours as needed. (Patient taking differently: Take 500 mg by mouth every 6 (six) hours as needed for moderate pain. ) 30 tablet 0  . Aspirin-Salicylamide-Caffeine (BC FAST PAIN RELIEF) 650-195-33.3 MG PACK Take 1 Package by mouth daily as needed (for pain).    . ibuprofen (ADVIL,MOTRIN) 800 MG tablet Take 800 mg by mouth every 4 (four) hours as needed for moderate pain.    . naproxen (NAPROSYN) 500 MG tablet Take 1 tablet (500 mg total) by mouth 2 (two) times daily.   30 tablet 0    Home: Home Living Family/patient expects to be discharged to:: Unsure Living Arrangements: Non-relatives/Friends Available Help at Discharge: Family, Available PRN/intermittently Type of Home: House Home  Access:  (Unsure) Home Equipment: Cane - single point Additional Comments: Pt states "essentailly I'm homeless". Pt states he lives with a friend who has moved and he doesn't know where he will go after D/C.   Functional History: Prior Function Level of Independence: Independent with assistive device(s) Comments: Used the cane all the time.   Functional Status:  Mobility: Bed Mobility Overal bed mobility: Needs Assistance Bed Mobility: Supine to Sit Supine to sit: Min guard General bed mobility comments: HOB up and use of rail Transfers Overall transfer level: Needs assistance Equipment used: Rolling walker (2 wheeled) Transfers: Sit to/from Stand Sit to Stand: Min assist Stand pivot transfers: Min assist, +2 physical assistance General transfer comment: 50% VC's on proper hand placem,ent and increased time to rise and sit due to "tightness"  and pain level Ambulation/Gait Ambulation/Gait assistance: Min assist, +2 safety/equipment Ambulation Distance (Feet): 55 Feet Assistive device: Rolling walker (2 wheeled) General Gait Details: initially unable to advance L LE required 25% assist.  Increased ability with increased gait distance.  Max c/o swelling and pain esp during L LE advancement.   Gait velocity: decreased    ADL: ADL Overall ADL's : Needs assistance/impaired Eating/Feeding: Independent, Sitting Grooming: Min guard, Standing Upper Body Bathing: Set up, Sitting Lower Body Bathing: Minimal assistance (with min A sit<>stand) Upper Body Dressing : Set up, Sitting Lower Body Dressing: Minimal assistance, With adaptive equipment (min A sit<>stand) Toilet Transfer: Minimal assistance, Ambulation (Bed>out door and down hallway> sit in recliner behind him) General ADL Comments: Eval limited to bed level due to pain. Pt very flat during session. States he did'nt know what he was going to do. limited mobility in bed due to pain. Attempted to reposition LLE unsuccessfully. Pt  declined getting out of bed this pm.   Cognition: Cognition Overall Cognitive Status: Within Functional Limits for tasks assessed Orientation Level: Oriented X4 Cognition Arousal/Alertness: Awake/alert Behavior During Therapy: WFL for tasks assessed/performed Overall Cognitive Status: Within Functional Limits for tasks assessed  Physical Exam: Blood pressure 109/54, pulse 71, temperature 98.9 F (37.2 C), temperature source Oral, resp. rate 16, height 6' 6" (1.981 m), weight 73.211 kg (161 lb 6.4 oz), SpO2 99 %. Physical Exam Constitutional: He is oriented to person, place, and time. He appears well-developed.  HENT: oral mucosa moist. Head: Normocephalic.  Eyes: EOM are normal.  Neck: Normal range of motion. Neck supple. No thyromegaly present.  Cardiovascular: Normal rate and regular rhythm. no murmurs rubs,  Respiratory:  Decreased breath sounds at the bases but clear to auscultation. No distress GI: Soft. Bowel sounds are normal. He exhibits no distension.  Neurological: He is alert and oriented to person, place, and time. Sensory normal. UE: 4/5 prox to 5/5 distal. RLE: 4/5 to 5/5 distally. LLE: (pain) 2/5 to 4/5 distally. DTR's 1+, CN exam normal Musc: pain in left hip with PROM of leg. Left chest wall discomfort also with shoulder ROM. Skin:  Proximal and distal incisions with staples/intact, mild serosanginous drainage.  Left knee effusion Left thigh is swollen and tender no ecchymosis       Medical Problem List and Plan: 1.  Limited functional mobility secondary to pathologic left femur fracture/poorly differentiated adenocarcinoma. Status post intramedullary nail placement 12/09/2015. Weightbearing as tolerated 2.  DVT Prophylaxis/Anticoagulation: SCDs. Check vascular study 3. Pain Management:   OxyContin sustained release 40 mg every 12 hours, oxycodone and Robaxin as needed. Monitor with increased mobility---may need further titration. Still having pain at rest. 4.  Mood: team to provide ego support. Invited the patient to be open with team regarding any need for support, counseling, etc 5. Neuropsych: This patient is capable of making decisions on his own behalf. 6. Skin/Wound Care: Routine skin checks 7. Fluids/Electrolytes/Nutrition: Routine I&O's with follow-up chemistries 8. Acute blood loss anemia. Follow-up CBC upon admission 9. Constipation. Laxative assistance 10. Tobacco abuse. Counseling    Post Admission Physician Evaluation: 1. Functional deficits secondary  to pathological left femur fracture. 2. Patient is admitted to receive collaborative, interdisciplinary care between the physiatrist, rehab nursing staff, and therapy team. 3. Patient's level of medical complexity and substantial therapy needs in context of that medical necessity cannot be provided at a lesser intensity of care such as a SNF. 4. Patient has experienced substantial functional loss from his/her baseline which was documented above under the "Functional History" and "Functional Status" headings.  Judging by the patient's diagnosis, physical exam, and functional history, the patient has potential for functional progress which will result in measurable gains while on inpatient rehab.  These gains will be of substantial and practical use upon discharge  in facilitating mobility and self-care at the household level.  5. Physiatrist will provide 24 hour management of medical needs as well as oversight of the therapy plan/treatment and provide guidance as appropriate regarding the interaction of the two. 6. 24 hour rehab nursing will assist with bladder management, bowel management, safety, skin/wound care, disease management, medication administration, pain management and patient education  and help integrate therapy concepts, techniques,education, etc. 7. PT will assess and treat for/with: Lower extremity strength, range of motion, stamina, balance, functional mobility, safety,  adaptive techniques and equipment, pain mgt, ortho precautions, activity tolerance.   Goals are: mod I to supervision. 8. OT will assess and treat for/with: ADL's, functional mobility, safety, upper extremity strength, adaptive techniques and equipment, pain mgt, ortho precautions, ego support, community reintegration.   Goals are: mod I to supervision. Therapy may proceed with showering this patient. 9. SLP will assess and treat for/with: n/a3.  Goals are: n/a. 10. Case Management and Social Worker will assess and treat for psychological issues and discharge planning. 11. Team conference will be held weekly to assess progress toward goals and to determine barriers to discharge. 12. Patient will receive at least 3 hours of therapy per day at least 5 days per week. 13. ELOS: 11-15 days       14. Prognosis:  excellent     Marvin Saladin T. Adalee Kathan, MD, FAAPMR [facility] [facility] 12/16/2015    

## 2015-12-15 NOTE — Progress Notes (Signed)
Occupational Therapy Treatment Patient Details Name: Marvin Hardy MRN: [id number] DOB: 1961-01-10 Today's Date: 12/15/2015    History of present illness Pt is a 55 yo M with no significant past medical history presenting to the hospital with pain in his left lower extremity. Patient states he was at work and twisted his left leg while trying to turn while walking down a step. States he heard his "bone pop" when this happened. Denies having any history of fractures in the past. 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 He is now s/p IM nailing and is WBAT.   OT comments  This 55 yo male admitted and underwent above presents to acute OT making progress with transfers and LB ADLs. He will continue to benefit from acute OT with follow up OT on CIR.  Follow Up Recommendations  CIR;Supervision/Assistance - 24 hour    Equipment Recommendations  3 in 1 bedside comode       Precautions / Restrictions Precautions Precautions: Fall Restrictions Weight Bearing Restrictions: No LLE Weight Bearing: Weight bearing as tolerated       Mobility Bed Mobility Overal bed mobility: Needs Assistance Bed Mobility: Supine to Sit     Supine to sit: Min guard     General bed mobility comments: HOB up and use of rail  Transfers Overall transfer level: Needs assistance Equipment used: Rolling walker (2 wheeled) Transfers: Sit to/from Stand Sit to Stand: Min assist              Balance Overall balance assessment: Needs assistance Sitting-balance support: Feet supported;No upper extremity supported Sitting balance-Leahy Scale: Good     Standing balance support: Bilateral upper extremity supported;During functional activity Standing balance-Leahy Scale: Poor Standing balance comment: reliant on RW                   ADL Overall ADL's : Needs assistance/impaired Eating/Feeding: Independent;Sitting   Grooming: Min  guard;Standing   Upper Body Bathing: Set up;Sitting   Lower Body Bathing: Minimal assistance (with min A sit<>stand)   Upper Body Dressing : Set up;Sitting   Lower Body Dressing: Minimal assistance;With adaptive equipment (min A sit<>stand)   Toilet Transfer: Minimal assistance;Ambulation (Bed>out door and down hallway> sit in recliner behind him)                              Cognition   Behavior During Therapy: WFL for tasks assessed/performed Overall Cognitive Status: Within Functional Limits for tasks assessed                                    Pertinent Vitals/ Pain       Pain Assessment: 0-10 Pain Score: 9  Pain Location: LLE--more around knee Pain Descriptors / Indicators: Sore;Guarding Pain Intervention(s): Monitored during session;Repositioned         Frequency Min 2X/week     Progress Toward Goals  OT Goals(current goals can now be found in the care plan section)  Progress towards OT goals: Progressing toward goals     Plan Discharge plan remains appropriate    Co-evaluation    PT/OT/SLP Co-Evaluation/Treatment: Yes (partial)     OT goals addressed during session: ADL's and self-care;Strengthening/ROM      End of Session Equipment Utilized During Treatment: Gait belt;Rolling walker   Activity Tolerance Patient tolerated  treatment well   Patient Left  (finishing ambulating with PT)   Nurse Communication Mobility status        Time: 2162-4469 OT Time Calculation (min): 23 min  Charges: OT General Charges $OT Visit: 1 Procedure OT Treatments $Self Care/Home Management : 8-22 mins  Almon Register [phone] 12/15/2015, 12:28 PM

## 2015-12-15 NOTE — Clinical Social Work Placement (Signed)
   CLINICAL SOCIAL WORK PLACEMENT  NOTE  Date:  12/15/2015  Patient Details  Name: Marvin Hardy MRN: [id number] Date of Birth: 22-Jul-1961  Clinical Social Work is seeking post-discharge placement for this patient at the [place] level of care (*CSW will initial, date and re-position this form in  chart as items are completed):  Yes   Patient/family provided with [facility] Work Department's list of facilities offering this level of care within the geographic area requested by the patient (or if unable, by the patient's family).  Yes   Patient/family informed of their freedom to choose among providers that offer the needed level of care, that participate in Medicare, Medicaid or managed care program needed by the patient, have an available bed and are willing to accept the patient.  Yes   Patient/family informed of [facility]'s ownership interest in [facility] and [facility], as well as of the fact that they are under no obligation to receive care at these facilities.  PASRR submitted to EDS on 12/15/15     PASRR number received on 12/15/15     Existing PASRR number confirmed on       FL2 transmitted to all facilities in geographic area requested by pt/family on 12/15/15     FL2 transmitted to all facilities within larger geographic area on       Patient informed that his/her managed care company has contracts with or will negotiate with certain facilities, including the following:            Patient/family informed of bed offers received.  Patient chooses bed at       Physician recommends and patient chooses bed at      Patient to be transferred to   on  .  Patient to be transferred to facility by       Patient family notified on   of transfer.  Name of family member notified:        PHYSICIAN Please sign FL2     Additional Comment:    _______________________________________________ Caroline Sauger, LCSW 12/15/2015, 2:51  PM

## 2015-12-15 NOTE — Progress Notes (Signed)
Rehab admissions - I have called [employer] [place] and have opened the case requesting acute inpatient rehab admission.  I have faxed information to them.  I am waiting to hear back from insurance case manager regarding potential acute inpatient rehab admission.  Call me for questions.  #[phone]

## 2015-12-15 NOTE — Progress Notes (Signed)
Subjective:  Doing well this morning. Pain much better controlled. Denies nausea or vomiting, able to tolerate reg diet. Still denies bowel movement, even after the dulcolax suppository  We discussed with his significant other, and also with his brother over the phone about the biopsy results, the prognosis, and the next steps, and they indicated understanding of this.     Objective: Vital signs in last 24 hours: Filed Vitals:   12/14/15 0457 12/14/15 1429 12/14/15 2135 12/15/15 0558  BP: 100/56 117/61 108/56 109/54  Pulse: 63 80 53 71  Temp: 97.5 F (36.4 C) 99.6 F (37.6 C) 99.1 F (37.3 C) 98.9 F (37.2 C)  TempSrc: Oral Oral Oral Oral  Resp: '16 18 16 16  '$ Height:      Weight:      SpO2: 97% 99% 98% 99%   Weight change:   Intake/Output Summary (Last 24 hours) at 12/15/15 0754 Last data filed at 12/15/15 0557  Gross per 24 hour  Intake      0 ml  Output    500 ml  Net   -500 ml    General: Vital signs reviewed. Patient in NAD Cardiovascular: regular rate, rhythm, no murmurs Pulmonary/Chest: CTAB Abdominal: Soft, non-tender, non-distended, BS + Extremities:   Has bandaged at the inferior left femur site- looks c/d/i.  No swelling or warmth in either knee     Lab Results: No results found for this or any previous visit (from the past 24 hour(s)).  Micro Results: Recent Results (from the past 240 hour(s))  Surgical pcr screen     Status: None   Collection Time: 12/08/15  9:45 PM  Result Value Ref Range Status   MRSA, PCR NEGATIVE NEGATIVE Final   Staphylococcus aureus NEGATIVE NEGATIVE Final    Comment:        The Xpert SA Assay (FDA approved for NASAL specimens in patients over 5 years of age), is one component of a comprehensive surveillance program.  Test performance has been validated by [facility] for patients greater than or equal to 30 year old. It is not intended to diagnose infection nor to guide or monitor treatment.     Studies/Results: Dg Knee 1-2 Views Left  12/13/2015  CLINICAL DATA:  Pt reports tightness in left knee x 2 days after sitting up for a while after doing therapy; swelling to the left knee also but pt reports it was there prior to surgery and appears to have decreased; pt had a left IM nail. EXAM: LEFT KNEE - 1-2 VIEW COMPARISON:  None available FINDINGS: Lower portion of an intra medullary nail is identified in the distal femur. Surgical clips overlie the distal medial thigh. No acute fracture or subluxation identified. Small joint effusion suspected. IMPRESSION: Status post ORIF.  Small joint effusion. Electronically Signed   By: Nolon Nations M.D.   On: 12/13/2015 15:02   Medications: I have reviewed the patient's current medications. Scheduled Meds: . acetaminophen  1,000 mg Oral Q6H  . bisacodyl  10 mg Rectal Daily  . docusate sodium  100 mg Oral BID  . ketorolac  15 mg Intravenous Q6H  . oxyCODONE  40 mg Oral Q12H  . polyethylene glycol  17 g Oral Daily  . senna-docusate  1 tablet Oral QHS   Continuous Infusions:   PRN Meds:.alum & mag hydroxide-simeth, HYDROmorphone (DILAUDID) injection, menthol-cetylpyridinium **OR** phenol, methocarbamol **OR** [DISCONTINUED] methocarbamol (ROBAXIN)  IV, ondansetron **OR** ondansetron (ZOFRAN) IV, oxyCODONE Assessment/Plan: Active Problems:   Femur fracture ([place])  Femur fracture, left ([facility])   Closed fracture of shaft of left femur ([facility])   Swelling of right testicle   Fracture   Mass of soft tissue   Pain   Effusion of knee  Metastatic cancer:  Patient underwent a CT guided biopsy of the several soft tissue masses. 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 Further testing is pending and pt will be [facility] at the [facility]. Pt has been evaluated by PM&R who recommends inpatient rehab. Pt's post-op pain is much better controlled after we increased his long-acting narcotic dose. Bone scan shows no  additional bone involvement. Left knee xray shows a small effusion but otherwise no other changes and is s/p ORIF.  Pt is ok for discharge to Rehab from orthopaedics standpoint and we appreciate PM&R help.  -Consulted Oncology- following and appreciate recs, set up outpt appt    Pathologic left femur fracture:  s/p IM nail of left femur. PT recommending CIR   -Oxycodone to 10-15 mg q4 hours PRN  -IV dilaudid 2-4 mg q4 hours PRN -Oxycontin to 40 mg BID -Scheduled Tylenol '1000mg'$  Q6hr and Toradol '15mg'$  Q6hr. -Increase bowel regimen. Now on Docusate '100mg'$  BID, Miralax daily, Senokot-S QHS.  Constipation: Pt willing tot ry mag citrate today -docusate 100 mg bid -miralax daily -senokot QHS -dulcolax suppository  -mag citrate   DVT Ppx: Pt is on lovenox 40 mg daily  Diet- reg  Dispo: Disposition is deferred at this time, awaiting improvement of current medical problems.  Anticipated discharge in approximately 3 day(s).   The patient does have a current PCP (No Pcp Per Patient) and does not need an [facility] hospital follow-up appointment after discharge.  The patient does not have transportation limitations that hinder transportation to clinic appointments.  .Services Needed at time of discharge: Y = Yes, Blank = No PT:   OT:   RN:   Equipment:   Other:     LOS: 9 days   Burgess Estelle, MD 12/15/2015, 7:54 AM

## 2015-12-15 NOTE — Progress Notes (Signed)
Physical Therapy Treatment Patient Details Name: Marvin Hardy MRN: [id number] DOB: [birth year] Today's Date: 12/15/2015    History of Present Illness Pt is a 55 yo M with no significant past medical history presenting to the hospital with pain in his left lower extremity. Patient states he was at work and twisted his left leg while trying to turn while walking down a step. States he heard his "bone pop" when this happened. Denies having any history of fractures in the past. 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 He is now s/p IM nailing and is WBAT.    PT Comments    Pt performed increased actvity tolerance and progressing well towards goals.  Pt remains to require min assist for safety.   Progressed patient to supine therapeutic exercise to improve strength in LLE.  Will cont efforts during acute hospitalization.     Follow Up Recommendations  CIR     Equipment Recommendations  Rolling walker with 5" wheels;3in1 (PT)    Recommendations for Other Services Rehab consult     Precautions / Restrictions Precautions Precautions: Fall Restrictions Weight Bearing Restrictions: No LLE Weight Bearing: Weight bearing as tolerated    Mobility  Bed Mobility Overal bed mobility: Needs Assistance Bed Mobility: Supine to Sit     Supine to sit: Min guard     General bed mobility comments: HOB up and use of rail  Transfers Overall transfer level: Needs assistance Equipment used: Rolling walker (2 wheeled) Transfers: Sit to/from Stand Sit to Stand: Min assist         General transfer comment: Pt pushed on R elbow while holding front of RW.  Pt did not pull on RW but pushed down through RW.  Pt educated on safety to make sure if using this technique he does not pull.    Ambulation/Gait Ambulation/Gait assistance: Min assist;+2 safety/equipment Ambulation Distance (Feet): 85 Feet Assistive device: Rolling walker (2  wheeled) Gait Pattern/deviations: Step-to pattern;Antalgic;Decreased stride length;Wide base of support;Decreased stance time - left;Trunk flexed Gait velocity: decreased   General Gait Details: Pt required cues for sequencing, RW position and forward gaze.  Pt able to advance gait distance with close chair follow.     Stairs            Wheelchair Mobility    Modified Rankin (Stroke Patients Only)       Balance Overall balance assessment: Needs assistance Sitting-balance support: Feet supported;No upper extremity supported Sitting balance-Leahy Scale: Good     Standing balance support: Bilateral upper extremity supported;During functional activity Standing balance-Leahy Scale: Fair Standing balance comment: reliant on RW                    Cognition Arousal/Alertness: Awake/alert Behavior During Therapy: WFL for tasks assessed/performed Overall Cognitive Status: Within Functional Limits for tasks assessed                      Exercises Total Joint Exercises Ankle Circles/Pumps: AROM;Both;10 reps;Supine Quad Sets: AROM;Left;10 reps;Supine Heel Slides: AAROM Hip ABduction/ADduction: AAROM Straight Leg Raises: AAROM    General Comments        Pertinent Vitals/Pain Pain Assessment: 0-10 Pain Score: 9  Pain Location: LLE more around knee in L stance phase during ambulation.   Pain Descriptors / Indicators: Sore;Guarding;Grimacing Pain Intervention(s): Monitored during session;Repositioned    Home Living  Prior Function            PT Goals (current goals can now be found in the care plan section) Acute Rehab PT Goals Patient Stated Goal: Decrease pain Potential to Achieve Goals: Good Progress towards PT goals: Progressing toward goals    Frequency  Min 5X/week    PT Plan Current plan remains appropriate    Co-evaluation PT/OT/SLP Co-Evaluation/Treatment: Yes Reason for Co-Treatment: Complexity of the  patient's impairments (multi-system involvement) (Pt progressed well this session and will no longer require co-tx moving forward during acute hospitalization.) PT goals addressed during session: Mobility/safety with mobility;Strengthening/ROM OT goals addressed during session: ADL's and self-care;Strengthening/ROM     End of Session Equipment Utilized During Treatment: Gait belt Activity Tolerance: Patient limited by pain Patient left: in chair;with call bell/phone within reach     Time: [id number] (co treatment ended at 09:40 and PTA continued tx.  ) PT Time Calculation (min) (ACUTE ONLY): 38 min  Charges:  $Gait Training: 8-22 mins $Therapeutic Exercise: 8-22 mins                    G Codes:      Cristela Blue 01-11-16, 3:46 PM Governor Rooks, PTA pager [phone]

## 2015-12-15 NOTE — NC FL2 (Signed)
[place] LEVEL OF CARE SCREENING TOOL     IDENTIFICATION  Patient Name: Marvin Hardy Birthdate: 05/27/1961 Sex: male Admission Date (Current Location): 12/06/2015  [facility] and [place] Number:  Herbalist and Address:  [facility], [place] [address] [place], [place] [address]      Provider Number: [id number]  Attending Physician Name and Address:  Annia Belt, MD  Relative Name and Phone Number:       Current Level of Care: Hospital Recommended Level of Care: [place] Prior Approval Number:    Date Approved/Denied:   PASRR Number: [id number]  Discharge Plan: SNF    Current Diagnoses: Patient Active Problem List   Diagnosis Date Noted  . Effusion of knee   . Fracture   . Mass of soft tissue   . Pain   . Femur fracture ([place]) 12/06/2015  . Femur fracture, left ([place]) 12/06/2015  . Closed fracture of shaft of left femur ([place])   . Swelling of right testicle     Orientation RESPIRATION BLADDER Height & Weight     Self, Time, Situation, Place  Normal Continent Weight: 161 lb 6.4 oz (73.211 kg) Height:  '6\' 6"'$  (198.1 cm)  BEHAVIORAL SYMPTOMS/MOOD NEUROLOGICAL BOWEL NUTRITION STATUS      Continent Diet (Please see discharge summary.)  AMBULATORY STATUS COMMUNICATION OF NEEDS Skin   Limited Assist Verbally Surgical wounds                       Personal Care Assistance Level of Assistance  Bathing, Feeding, Dressing Bathing Assistance: Limited assistance Feeding assistance: Independent Dressing Assistance: Limited assistance     Functional Limitations Info             SPECIAL CARE FACTORS FREQUENCY  PT (By licensed PT), OT (By licensed OT)     PT Frequency: 5 OT Frequency: 5            Contractures      Additional Factors Info  Code Status, Allergies Code Status Info: FULL Allergies Info: Aleve           Current Medications (12/15/2015):  This is the current hospital  active medication list Current Facility-Administered Medications  Medication Dose Route Frequency Provider Last Rate Last Dose  . acetaminophen (TYLENOL) tablet 1,000 mg  1,000 mg Oral Q6H Milagros Loll, MD   1,000 mg at 12/15/15 1315  . alum & mag hydroxide-simeth (MAALOX/MYLANTA) 200-200-20 MG/5ML suspension 30 mL  30 mL Oral Q4H PRN Mcarthur Rossetti, MD      . bisacodyl (DULCOLAX) suppository 10 mg  10 mg Rectal Daily Burgess Estelle, MD   10 mg at 12/14/15 1421  . docusate sodium (COLACE) capsule 100 mg  100 mg Oral BID Mcarthur Rossetti, MD   100 mg at 12/15/15 0949  . HYDROmorphone (DILAUDID) injection 2-4 mg  2-4 mg Intravenous Q4H PRN Milagros Loll, MD      . ketorolac (TORADOL) 15 MG/ML injection 15 mg  15 mg Intravenous Q6H Milagros Loll, MD   15 mg at 12/15/15 1316  . menthol-cetylpyridinium (CEPACOL) lozenge 3 mg  1 lozenge Oral PRN Mcarthur Rossetti, MD       Or  . phenol (CHLORASEPTIC) mouth spray 1 spray  1 spray Mouth/Throat PRN Mcarthur Rossetti, MD      . methocarbamol (ROBAXIN) tablet 500 mg  500 mg Oral Q6H PRN Mcarthur Rossetti, MD  500 mg at 12/15/15 0949  . ondansetron (ZOFRAN) tablet 4 mg  4 mg Oral Q6H PRN Mcarthur Rossetti, MD       Or  . ondansetron [facility]) injection 4 mg  4 mg Intravenous Q6H PRN Mcarthur Rossetti, MD      . oxyCODONE (Oxy IR/ROXICODONE) immediate release tablet 10-15 mg  10-15 mg Oral Q4H PRN Milagros Loll, MD   10 mg at 12/15/15 0949  . oxyCODONE (OXYCONTIN) 12 hr tablet 40 mg  40 mg Oral Q12H Milagros Loll, MD   40 mg at 12/15/15 0948  . polyethylene glycol (MIRALAX / GLYCOLAX) packet 17 g  17 g Oral Daily Milagros Loll, MD   17 g at 12/15/15 0947  . senna-docusate (Senokot-S) tablet 1 tablet  1 tablet Oral QHS Milagros Loll, MD   1 tablet at 12/14/15 2150     Discharge Medications: Please see discharge summary for a list of discharge medications.  Relevant Imaging Results:  Relevant  Lab Results:   Additional Information SSN: [id number]  Caroline Sauger, [place]

## 2015-12-15 NOTE — Discharge Summary (Signed)
Name: Marvin Hardy MRN: [id number] DOB: Oct 13, 1960 55 y.o. PCP: No Pcp Per Patient  Date of Admission: 12/06/2015  2:49 PM Date of Discharge: 12/15/2015 Attending Physician: Annia Belt, MD  Discharge Diagnosis: 1. 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  Active Problems:   Femur fracture (HCC)   Femur fracture, left (HCC)   Closed fracture of shaft of left femur (HCC)   Swelling of right testicle   Fracture   Mass of soft tissue   Pain   Effusion of knee  Discharge Medications:   Medication List    STOP taking these medications        BC FAST PAIN RELIEF 650-195-33.3 MG Pack  Generic drug:  Aspirin-Salicylamide-Caffeine      TAKE these medications        acetaminophen 500 MG tablet  Commonly known as:  TYLENOL  Take 1 tablet (500 mg total) by mouth every 6 (six) hours as needed.     alum & mag hydroxide-simeth 200-200-20 MG/5ML suspension  Commonly known as:  MAALOX/MYLANTA  Take 30 mLs by mouth every 4 (four) hours as needed for indigestion.     bisacodyl 10 MG suppository  Commonly known as:  DULCOLAX  Place 1 suppository (10 mg total) rectally daily.     docusate sodium 100 MG capsule  Commonly known as:  COLACE  Take 1 capsule (100 mg total) by mouth 2 (two) times daily.     ibuprofen 800 MG tablet  Commonly known as:  ADVIL,MOTRIN  Take 800 mg by mouth every 4 (four) hours as needed for moderate pain.     naproxen 500 MG tablet  Commonly known as:  NAPROSYN  Take 1 tablet (500 mg total) by mouth 2 (two) times daily.     ondansetron 4 MG tablet  Commonly known as:  ZOFRAN  Take 1 tablet (4 mg total) by mouth every 6 (six) hours as needed for nausea.     Oxycodone HCl 10 MG Tabs  Take 1-1.5 tablets (10-15 mg total) by mouth every 4 (four) hours as needed for severe pain ((for MODERATE breakthrough pain)).     oxyCODONE 40 mg 12 hr tablet  Commonly known as:  OXYCONTIN  Take 1 tablet (40 mg total) by  mouth every 12 (twelve) hours.     polyethylene glycol packet  Commonly known as:  MIRALAX / GLYCOLAX  Take 17 g by mouth daily.     senna-docusate 8.6-50 MG tablet  Commonly known as:  Senokot-S  Take 1 tablet by mouth at bedtime.        Disposition and follow-up:   Mr.Marvin Hardy was discharged from [facility] in Stable condition.  At the hospital follow up visit please address:  1.  Metastatic cancer: Pt will be going to [facility] after being discharged from inpatient rehab. They have been notified.  Left femur fracture: Pt to follow up with orthopedics for post-operative visit   2.  Labs / imaging needed at time of follow-up:   3.  Pending labs/ test needing follow-up:   Follow-up Appointments:     Follow-up Information    Follow up with Mcarthur Rossetti, MD. Schedule an appointment as soon as possible for a visit in 2 weeks.   Specialty:  Orthopedic Surgery   Contact information:   [place] [address] [phone]       Follow up with Eilleen Kempf., MD. Go in 1 week.   Specialty:  Oncology   Why:  [place]- new patient visit   Contact information:   [place] [address] [phone]       Discharge Instructions: Discharge Instructions    Diet - low sodium heart healthy    Complete by:  As directed      Discharge instructions    Complete by:  As directed   Please follow up in the [place] at [facility]- they will contact you to make the appointment with Dr. Julien Nordmann. At that time, they will go over more details regarding the results and discuss further management.  Please continue the physical therapy Please take the senna and docusate as needed for constipation. Also prescribed dulcolax suppository. The pain medications can make you constipated so it is important for you to take these.     Increase activity slowly    Complete by:  As directed              Consultations: Treatment Team:  Mcarthur Rossetti, MD Curt Bears, MD  Procedures Performed:  Dg Chest 1 View  12/06/2015  CLINICAL DATA:  Left femur fracture. Preoperative respiratory examination. EXAM: CHEST 1 VIEW COMPARISON:  None. FINDINGS: The cardiomediastinal silhouette is unremarkable. Pulmonary vascular congestion is noted. There is no evidence of focal airspace disease, pulmonary edema, suspicious pulmonary nodule/mass, pleural effusion, or pneumothorax. No acute bony abnormalities are identified. IMPRESSION: Pulmonary vascular congestion. Electronically Signed   By: Margarette Canada M.D.   On: 12/06/2015 16:35   Dg Knee 1-2 Views Left  12/13/2015  CLINICAL DATA:  Pt reports tightness in left knee x 2 days after sitting up for a while after doing therapy; swelling to the left knee also but pt reports it was there prior to surgery and appears to have decreased; pt had a left IM nail. EXAM: LEFT KNEE - 1-2 VIEW COMPARISON:  None available FINDINGS: Lower portion of an intra medullary nail is identified in the distal femur. Surgical clips overlie the distal medial thigh. No acute fracture or subluxation identified. Small joint effusion suspected. IMPRESSION: Status post ORIF.  Small joint effusion. Electronically Signed   By: Nolon Nations M.D.   On: 12/13/2015 15:02   Ct Chest W Contrast  12/06/2015  CLINICAL DATA:  Multiple myeloma.  Fell today. EXAM: CT CHEST, ABDOMEN, AND PELVIS WITH CONTRAST TECHNIQUE: Multidetector CT imaging of the chest, abdomen and pelvis was performed following the standard protocol during bolus administration of intravenous contrast. CONTRAST:  165m ISOVUE-300 IOPAMIDOL (ISOVUE-300) INJECTION 61% COMPARISON:  None. FINDINGS: CT CHEST No pneumothorax. No effusions. No acute fracture. No intrathoracic vascular injury. No mediastinal or hilar adenopathy. Axillary regions are unremarkable. There is a 2.5 x 3.6 cm mass in the posterior basal segment of the right  lower lobe, suspicious for neoplasm. Extensive centrilobular emphysematous changes are present in the upper lobes. CT ABDOMEN AND PELVIS No acute traumatic findings. No peritoneal blood or free air. Liver, spleen, pancreas and kidneys are unremarkable and intact. Aorta and IVC are normal in caliber, intact. There are adrenal masses bilaterally, measuring approximately 2 cm. There also is a 2.2 cm mass at the upper pole the right kidney which might represent a far posterior right adrenal nodule. The adrenals are poorly defined due to the marked paucity of surrounding fat. Bowel is unremarkable. Small volume ascites. No acute fracture is evident in the chest, abdomen or pelvis. There is a destructive bone lesion with associated soft tissue mass at the  anterior aspect of the left femur greater trochanter, seen to best advantage on coronal images 76 series 304. This soft tissue component measures 1.9 x 2.5 cm. There also are lytic defects of the left femoral subcapital and intertrochanteric regions which are worrisome for risk of pathologic fracture. IMPRESSION: 1. No evidence of acute traumatic injury in the chest, abdomen or pelvis. 2. 2.5 x 3.6 cm right lower lobe lung mass, suspicious for neoplasm. 3. Bilateral adrenal masses. There also is a 2.2 cm right suprarenal mass more posteriorly which may represent an additional adrenal mass. 4. Lytic lesions of the left femoral neck and intertrochanteric region, worrisome for risk for pathologic fracture. Electronically Signed   By: Andreas Newport M.D.   On: 12/06/2015 19:46   Mr [name] NU Contrast  12/07/2015  CLINICAL DATA:  55 year old male with pathologic fracture left femur. Lung lesion. Evaluate for intracranial metastatic disease. Initial encounter. EXAM: MRI HEAD WITHOUT AND WITH CONTRAST TECHNIQUE: Multiplanar, multiecho pulse sequences of the brain and surrounding structures were obtained without and with intravenous contrast. CONTRAST:  45m MULTIHANCE  GADOBENATE DIMEGLUMINE 529 MG/ML IV SOLN COMPARISON:  None. FINDINGS: Exam is motion degraded. In particular, post-contrast sequences are significantly motion degraded limiting evaluation for detection of subtle abnormality. No intracranial enhancing lesion detected. 4.1 x 2.6 x 2.2 cm mass adjacent to the left lateral pterygoid muscle. This is suspicious for possibility of metastatic disease. Given the motion degradation and this finding, contrast-enhanced CT of the head and neck recommended for further delineation. No acute infarct or intracranial hemorrhage. Major intracranial vascular structures appear patent. Abnormal signal intensity bone marrow upper cervical spine raises possibility of infiltration by tumor or result of underlying anemia. Slightly heterogeneous bone marrow of the calvarium. Attention to the calvarium on CT bone windows. IMPRESSION: Post-contrast sequences are significantly motion degraded limiting evaluation for detection of subtle abnormality. No intracranial enhancing lesion detected. 4.1 x 2.6 x 2.2 cm mass adjacent to the left lateral pterygoid muscle. This is suspicious for possibility of metastatic disease. Given the motion degradation and this finding, contrast-enhanced CT of the head and neck recommended for further delineation. Abnormal signal intensity of bone marrow of the upper cervical spine raises possibility of infiltration by tumor or result of underlying anemia. Slightly heterogeneous bone marrow of the calvarium. Attention to the calvarium on CT bone windows to evaluate for possibility of myeloma. Electronically Signed   By: SGenia DelM.D.   On: 12/07/2015 13:26   Nm Bone Scan Whole Body  12/12/2015  CLINICAL DATA:  Pathologic left femur fracture. EXAM: NUCLEAR MEDICINE WHOLE BODY BONE SCAN TECHNIQUE: Whole body anterior and posterior images were obtained approximately 3 hours after intravenous injection of radiopharmaceutical. RADIOPHARMACEUTICALS:  26 mCi  Technetium-93mDP IV COMPARISON:  Body CT 12/06/2015 FINDINGS: Activity in the mid left femur related to known pathologic fracture. Less intense activity at the left femoral neck and intertrochanteric femur related to discrete metastases seen by CT. Status post left femur ORIF. No suspicious activity elsewhere for osseous metastatic disease. Normal tracer distribution with bilateral renal and bladder activity seen. IMPRESSION: Abnormal activity in the proximal and mid left femur related to known metastases and pathologic diaphysis fracture. No evidence of skeletal metastasis elsewhere. Electronically Signed   By: JoMonte Fantasia.D.   On: 12/12/2015 16:57   UsKoreacrotum  12/07/2015  CLINICAL DATA:  Right scrotal swelling for 2 months EXAM: ULTRASOUND OF SCROTUM TECHNIQUE: Complete ultrasound examination of the testicles, epididymis, and other scrotal structures  was performed. COMPARISON:  None. FINDINGS: Right testicle Measurements: 3.5 x 3.1 x 2.7 cm. There is a 3 x 3 x 2 mm cyst in the right testis. No noncystic mass or microlithiasis visualized. Color flow is seen in the right testis. Left testicle Measurements: 4.3 x 3.5 x 2.8 cm. There is a 3 x 3 x 2 mm cyst in the left testis. No noncystic mass or microlithiasis visualized. Color flow is seen in the left testis. Right epididymis:  Normal in size and appearance. Left epididymis: No inflammatory focus. There are multiple cysts arising from the epididymal head, largest measuring 1 x 1 x 0.8 cm. Hydrocele: There is a large hydrocele on the right. There is a much smaller hydrocele on the left. Varicocele:  None visualized. No scrotal wall thickening or scrotal abscess. IMPRESSION: Sizable right hydrocele of uncertain etiology. Small left hydrocele. There are several epididymal head cysts/spermatoceles on the left. There is no epididymal inflammation on either side. There is a tiny cyst in each testis. Etiology uncertain. No noncystic testicular mass is  identified. Given these small intratesticular cysts, a followup study in 3-4 months to assess for stability would be reasonable. Color flow is appreciated in each testis. Electronically Signed   By: Lowella Grip III M.D.   On: 12/07/2015 08:53   Ct Abdomen Pelvis W Contrast  12/06/2015  CLINICAL DATA:  Multiple myeloma.  Fell today. EXAM: CT CHEST, ABDOMEN, AND PELVIS WITH CONTRAST TECHNIQUE: Multidetector CT imaging of the chest, abdomen and pelvis was performed following the standard protocol during bolus administration of intravenous contrast. CONTRAST:  171m ISOVUE-300 IOPAMIDOL (ISOVUE-300) INJECTION 61% COMPARISON:  None. FINDINGS: CT CHEST No pneumothorax. No effusions. No acute fracture. No intrathoracic vascular injury. No mediastinal or hilar adenopathy. Axillary regions are unremarkable. There is a 2.5 x 3.6 cm mass in the posterior basal segment of the right lower lobe, suspicious for neoplasm. Extensive centrilobular emphysematous changes are present in the upper lobes. CT ABDOMEN AND PELVIS No acute traumatic findings. No peritoneal blood or free air. Liver, spleen, pancreas and kidneys are unremarkable and intact. Aorta and IVC are normal in caliber, intact. There are adrenal masses bilaterally, measuring approximately 2 cm. There also is a 2.2 cm mass at the upper pole the right kidney which might represent a far posterior right adrenal nodule. The adrenals are poorly defined due to the marked paucity of surrounding fat. Bowel is unremarkable. Small volume ascites. No acute fracture is evident in the chest, abdomen or pelvis. There is a destructive bone lesion with associated soft tissue mass at the anterior aspect of the left femur greater trochanter, seen to best advantage on coronal images 76 series 304. This soft tissue component measures 1.9 x 2.5 cm. There also are lytic defects of the left femoral subcapital and intertrochanteric regions which are worrisome for risk of pathologic  fracture. IMPRESSION: 1. No evidence of acute traumatic injury in the chest, abdomen or pelvis. 2. 2.5 x 3.6 cm right lower lobe lung mass, suspicious for neoplasm. 3. Bilateral adrenal masses. There also is a 2.2 cm right suprarenal mass more posteriorly which may represent an additional adrenal mass. 4. Lytic lesions of the left femoral neck and intertrochanteric region, worrisome for risk for pathologic fracture. Electronically Signed   By: DAndreas NewportM.D.   On: 12/06/2015 19:46   Ct Femur Left Wo Contrast  12/06/2015  CLINICAL DATA:  55year old male with a history of fracture left femur EXAM: CT OF THE LOWER LEFT EXTREMITY  WITHOUT CONTRAST TECHNIQUE: Multidetector CT imaging of the was performed according to the standard protocol. COMPARISON:  Plain film of today's date. Contemporaneously CT of the abdomen/pelvis FINDINGS: Transverse fracture in the mid femur with approximately 26 degrees of lateral angulation at the fracture site. There is medial displacement of the distal fracture fragment by 1/2 shaft width. Soft tissue somewhat limited by the exclusion of IV contrast, however, there is abnormal soft tissue centered at the site of the fracture involving the type low ex base and expanding outside of the cortex circumferentially at the fracture site. Differentiating abnormal/pathologic soft tissue from hematoma is difficult. Permeative changes of the cortex at the fracture site with mild periosteal reaction and a wide zone of transition at the lesion. Lytic lesion at the greater trochanter, the femoral head neck junction, and the posterior femoral neck. Partially imaged right hydrocele versus lesion. IMPRESSION: Pathologic fracture at the mid left femoral diaphysis, with approximately 26 degrees of angulation at the fracture site, and permeative bone changes of the cortex. There is abnormal soft tissue within the medullary space centered at the fracture site, with circumferential soft tissue about the  femur, and a wide zone of transition. The soft tissue may either represent a primary osteosarcoma, or metastatic disease. Additional lytic lesions of the femur involves the femoral neck junction, posterior femoral neck, and the greater trochanter, compatible with metastatic disease. Partially imaged right-sided hydrocele versus scrotal lesion. Recommend correlation with physical exam. Signed, Dulcy Fanny. Earleen Newport, DO [facility] Electronically Signed   By: Corrie Mckusick D.O.   On: 12/06/2015 19:46   US Biopsy  12/08/2015  INDICATION: Left chest wall palpable mass, right lower lobe mass, concern for metastatic disease EXAM: ULTRASOUND GUIDED LEFT CHEST WALL SOFT TISSUE MASS BIOPSY MEDICATIONS: 1% LIDOCAINE LOCALLY ANESTHESIA/SEDATION: Versed 1.5 mg IV Moderate Sedation Time:  10 minutes The patient was continuously monitored during the procedure by the interventional radiology nurse under my direct supervision. PROCEDURE: The procedure, risks, benefits, and alternatives were explained to the patient. Questions regarding the procedure were encouraged and answered. The patient understands and consents to the procedure. The left chest wall soft tissue mass was prepped with ChloraPrep in a sterile fashion, and a sterile drape was applied covering the operative field. A sterile gown and sterile gloves were used for the procedure. Local anesthesia was provided with 1% Lidocaine. Previous imaging reviewed. Preliminary ultrasound performed. The left chest wall soft tissue mass was imaged with ultrasound. Overlying skin was marked. Under sterile conditions and local anesthesia, a 17 gauge coaxial needle was advanced into the lesion under direct ultrasound. Images obtained for documentation. 3 18 gauge core biopsies obtained through the access. Needle tract embolized with Gel-Foam. Samples were placed in formalin. No immediate complication. Patient tolerated the procedure  well. COMPLICATIONS: None immediate. FINDINGS: Imaging confirms needle placed in in the left chest wall mass for core biopsy IMPRESSION: Successful ultrasound left chest wall soft tissue mass 18 gauge core biopsy Electronically Signed   By: Jerilynn Mages.  Shick M.D.   On: 12/08/2015 12:33   Dg C-arm 1-60 Min  12/09/2015  CLINICAL DATA:  55 year old male undergoing ORIF for pathologic fracture of the left femoral midshaft. Right lower lobe lung mass. Initial encounter. EXAM: LEFT FEMUR 2 VIEWS; DG C-ARM 61-120 MIN COMPARISON:  Preoperative left femur series 5617. Chest CT 12/06/2015. FLUOROSCOPY TIME:  2 minutes 30 seconds FINDINGS: Six intraoperative fluoroscopic images of the left femur a demonstrating intra medullary rod placement with  proximal interlocking cannulated and distal interlocking cortical screws. Near anatomic alignment now about the pathologic transverse comminuted midshaft fracture. IMPRESSION: ORIF of the left femur midshaft pathologic fracture with no adverse features. Electronically Signed   By: Genevie Ann M.D.   On: 12/09/2015 15:55   Dg Femur Min 2 Views Left  12/09/2015  CLINICAL DATA:  55 year old male undergoing ORIF for pathologic fracture of the left femoral midshaft. Right lower lobe lung mass. Initial encounter. EXAM: LEFT FEMUR 2 VIEWS; DG C-ARM 61-120 MIN COMPARISON:  Preoperative left femur series 5617. Chest CT 12/06/2015. FLUOROSCOPY TIME:  2 minutes 30 seconds FINDINGS: Six intraoperative fluoroscopic images of the left femur a demonstrating intra medullary rod placement with proximal interlocking cannulated and distal interlocking cortical screws. Near anatomic alignment now about the pathologic transverse comminuted midshaft fracture. IMPRESSION: ORIF of the left femur midshaft pathologic fracture with no adverse features. Electronically Signed   By: Genevie Ann M.D.   On: 12/09/2015 15:55   Dg Femur Min 2 Views Left  12/06/2015  CLINICAL DATA:  Patient twisted leg with mid femur pain.  Initial encounter. EXAM: LEFT FEMUR 2 VIEWS COMPARISON:  None. FINDINGS: There is a transverse displaced fracture through the mid femoral diaphysis. There is an irregular lytic lesion involving the mid femoral diaphysis at the fracture site with overlying periosteal reaction. Overlying soft tissue swelling. IMPRESSION: Displaced transverse fracture through the mid femoral diaphysis. Irregular lytic lesion and periosteal reaction involving the mid femoral diaphysis at the fracture site is concerning for the possibility of metastatic disease or multiple myeloma. Electronically Signed   By: Lovey Newcomer M.D.   On: 12/06/2015 16:17    2D Echo:   Cardiac Cath:   Admission HPI:  Pt is a 55 yo M with no significant past medical history presenting to the hospital with pain in his left lower extremity. Patient states he was at work and twisted his left leg while trying to turn while walking down a step. States he heard his "bone pop" when this happened. Denies having any history of fractures in the past. Reports having a torn meniscus in his left knee for the past 2-3 months and is supposed to be getting surgery done sometime later this month. He denies having any recent fevers, change in appetite, cough, or hemoptysis. Reports having night sweats for the past few weeks and 7 pound weight loss (it is not clear from the history since when patient has been losing weight). Also reports having swelling in his testicle for the past 1 month. He also noticed a swelling on his left chest area a few weeks ago.   Hospital Course by problem list: Active Problems:   Femur fracture ([place])   Femur fracture, left (HCC)   Closed fracture of shaft of left femur (HCC)   Swelling of right testicle   Fracture   Mass of soft tissue   Pain   Effusion of knee   1.  Metastatic cancer: Pt who was previously healthy came in with a pathologic fracture after a fall. He was having unspecified weight loss, and some night sweats for  about 3 months. In the ER< a CT of the chest showed 2.5cm by 3 cm mass in the right lower lung, and also bilateral 2 cm adrenal masses. Oncology was consulted who came and saw the pt and recommended a brain MRI which showed a 4x2x2 cm mass adjacent to the left lateral pterygoid muscle, suspicious for metastatic spread.  He underwent a CT  guided biopsy of the soft tissue mass on the back which showed poorly differentiated adenocarcinoma with unknown primary, most likely lung. Further staining and genetic tests are still pending. Patient has been set up with the [facility] with Dr Julien Nordmann. A bone scan was also performed which was unremarkable.   Pathologic fracture of the left femur: He underwent an ORIF and IM nailing of the left femur, and postoperatively he recovered well. His pain was initially controlled with a PCA pump, and then with oxycodone PRN, scheduled tylenol, toradol. A long acting oxycontin was also added twice a day.  His hemoglobin was stable. His pain was well controlled on the day of discharge. He was evaluated by PM&R who recommended transfer to IP rehab on Monday. He worked with physical therapy and OT. PM&R wanted an xray of the left knee, which we obtained and which showed post-surgical changes, but no fracture. He is cleared by Orthopaedics for transfer to rehab.   Macrocytosis without anemia: hemoglobin was stable throughout.   Patient will be transferred to inpatient rehab.   Discharge Vitals:   BP 109/54 mmHg  Pulse 71  Temp(Src) 98.9 F (37.2 C) (Oral)  Resp 16  Ht _0  (1.981 m)  Wt 161 lb 6.4 oz (73.211 kg)  BMI 18.66 kg/m2  SpO2 99%  Discharge Labs:  No results found for this or any previous visit (from the past 24 hour(s)).  Signed: Burgess Estelle, MD 12/15/2015, 1:31 PM    Services Ordered on Discharge:  Equipment Ordered on Discharge:

## 2015-12-15 NOTE — Clinical Social Work Note (Signed)
Clinical Social Work Assessment  Patient Details  Name: Marvin Hardy MRN: [id number] Date of Birth: 11/22/1960  Date of referral:  12/15/15               Reason for consult:  Facility Placement, Discharge Planning                Permission sought to share information with:  Chartered certified accountant granted to share information::  Yes, Verbal Permission Granted  Name::        Agency::  [facility] (no preference)  Relationship::     Contact Information:     Housing/Transportation Living arrangements for the past 2 months:  [place] of Information:  Patient Patient Interpreter Needed:  None Criminal Activity/Legal Involvement Pertinent to Current Situation/Hospitalization:  No - Comment as needed Significant Relationships:  Significant Other (SO lives in [place].) Lives with:  Self Do you feel safe going back to the place where you live?  No (Patient states he has no where to go at time of discharge.) Need for family participation in patient care:  No (Coment) (Patient able to make own decisions.)  Care giving concerns:  Patient expressed concern for patient's care at time of discharge. Patient agreeable to CIR or SNF.   Social Worker assessment / plan:  LCSW received referral for CIR versus SNF placement at time of discharge. LCSW spoke with patient who stated patient was unsure of where patient would discharge to at time of discharge as patient would need assistance at home. Patient stated patient is agreeable to CIR placement or SNF depending on insurance approval. Patient very receptive to LCSW assistance with discharge planning. LCSW to continue to follow and assist with discharge planning needs.  Employment status:  Other (Comment) (Did not disclose.) Insurance information:  Managed Care Nurse, mental health) PT Recommendations:  Inpatient Rehab Consult Information / Referral to community resources:  [place]  Patient/Family's  Response to care:  Patient understanding and agreeable to LCSW plan of care.  Patient/Family's Understanding of and Emotional Response to Diagnosis, Current Treatment, and Prognosis:  Patient states to LCSW that patient is "blessed to be alive." Per report, patient understanding of new diagnosis.  Emotional Assessment Appearance:  Appears stated age Attitude/Demeanor/Rapport:  Other (Appropriate) Affect (typically observed):  Accepting, Appropriate, Pleasant Orientation:  Oriented to Self, Oriented to Place, Oriented to  Time, Oriented to Situation Alcohol / Substance use:  Not Applicable Psych involvement (Current and /or in the community):  No (Comment) (Not appropriate on this admission.)  Discharge Needs  Concerns to be addressed:  No discharge needs identified Readmission within the last 30 days:  No Current discharge risk:  None Barriers to Discharge:  No Barriers Identified   Caroline Sauger, LCSW 12/15/2015, 2:47 PM [phone]

## 2015-12-16 ENCOUNTER — Encounter (HOSPITAL_COMMUNITY): Payer: Self-pay | Admitting: Physician Assistant

## 2015-12-16 ENCOUNTER — Inpatient Hospital Stay (HOSPITAL_COMMUNITY)
Admission: RE | Admit: 2015-12-16 | Discharge: 2015-12-27 | DRG: 560 | Disposition: A | Discharge status: Home / Self Care | Payer: BLUE CROSS/BLUE SHIELD | Source: Intra-hospital | Attending: Physical Medicine & Rehabilitation | Admitting: Physical Medicine & Rehabilitation

## 2015-12-16 DIAGNOSIS — E871 Hypo-osmolality and hyponatremia: Secondary | ICD-10-CM | POA: Diagnosis not present

## 2015-12-16 DIAGNOSIS — D179 Benign lipomatous neoplasm, unspecified: Secondary | ICD-10-CM | POA: Diagnosis not present

## 2015-12-16 DIAGNOSIS — Z7189 Other specified counseling: Secondary | ICD-10-CM | POA: Diagnosis not present

## 2015-12-16 DIAGNOSIS — M84552S Pathological fracture in neoplastic disease, left femur, sequela: Secondary | ICD-10-CM

## 2015-12-16 DIAGNOSIS — C3431 Malignant neoplasm of lower lobe, right bronchus or lung: Secondary | ICD-10-CM | POA: Diagnosis not present

## 2015-12-16 DIAGNOSIS — Z791 Long term (current) use of non-steroidal anti-inflammatories (NSAID): Secondary | ICD-10-CM

## 2015-12-16 DIAGNOSIS — E46 Unspecified protein-calorie malnutrition: Secondary | ICD-10-CM | POA: Diagnosis not present

## 2015-12-16 DIAGNOSIS — E8809 Other disorders of plasma-protein metabolism, not elsewhere classified: Secondary | ICD-10-CM | POA: Diagnosis not present

## 2015-12-16 DIAGNOSIS — M84452D Pathological fracture, left femur, subsequent encounter for fracture with routine healing: Secondary | ICD-10-CM | POA: Diagnosis present

## 2015-12-16 DIAGNOSIS — R1084 Generalized abdominal pain: Secondary | ICD-10-CM | POA: Diagnosis not present

## 2015-12-16 DIAGNOSIS — D62 Acute posthemorrhagic anemia: Secondary | ICD-10-CM | POA: Diagnosis not present

## 2015-12-16 DIAGNOSIS — D72829 Elevated white blood cell count, unspecified: Secondary | ICD-10-CM | POA: Diagnosis not present

## 2015-12-16 DIAGNOSIS — R609 Edema, unspecified: Secondary | ICD-10-CM | POA: Insufficient documentation

## 2015-12-16 DIAGNOSIS — Z590 Homelessness: Secondary | ICD-10-CM

## 2015-12-16 DIAGNOSIS — F4321 Adjustment disorder with depressed mood: Secondary | ICD-10-CM | POA: Insufficient documentation

## 2015-12-16 DIAGNOSIS — R634 Abnormal weight loss: Secondary | ICD-10-CM

## 2015-12-16 DIAGNOSIS — D170 Benign lipomatous neoplasm of skin and subcutaneous tissue of head, face and neck: Secondary | ICD-10-CM

## 2015-12-16 DIAGNOSIS — C801 Malignant (primary) neoplasm, unspecified: Secondary | ICD-10-CM

## 2015-12-16 DIAGNOSIS — F17290 Nicotine dependence, other tobacco product, uncomplicated: Secondary | ICD-10-CM

## 2015-12-16 DIAGNOSIS — Z515 Encounter for palliative care: Secondary | ICD-10-CM | POA: Diagnosis not present

## 2015-12-16 DIAGNOSIS — K5900 Constipation, unspecified: Secondary | ICD-10-CM | POA: Diagnosis not present

## 2015-12-16 DIAGNOSIS — M84552A Pathological fracture in neoplastic disease, left femur, initial encounter for fracture: Secondary | ICD-10-CM | POA: Insufficient documentation

## 2015-12-16 DIAGNOSIS — R64 Cachexia: Secondary | ICD-10-CM | POA: Diagnosis not present

## 2015-12-16 DIAGNOSIS — R109 Unspecified abdominal pain: Secondary | ICD-10-CM

## 2015-12-16 DIAGNOSIS — R52 Pain, unspecified: Secondary | ICD-10-CM

## 2015-12-16 HISTORY — DX: Malignant (primary) neoplasm, unspecified: C80.1

## 2015-12-16 LAB — CBC WITH DIFFERENTIAL/PLATELET
Basophils Absolute: 0 10*3/uL (ref 0.0–0.1)
Basophils Relative: 0 %
EOS ABS: 0.1 10*3/uL (ref 0.0–0.7)
EOS PCT: 2 %
HCT: 22.3 % — ABNORMAL LOW (ref 39.0–52.0)
HEMOGLOBIN: 7.5 g/dL — AB (ref 13.0–17.0)
Lymphocytes Relative: 25 %
Lymphs Abs: 1.9 10*3/uL (ref 0.7–4.0)
MCH: 33.5 pg (ref 26.0–34.0)
MCHC: 33.6 g/dL (ref 30.0–36.0)
MCV: 99.6 fL (ref 78.0–100.0)
MONOS PCT: 8 %
Monocytes Absolute: 0.6 10*3/uL (ref 0.1–1.0)
NEUTROS PCT: 65 %
Neutro Abs: 4.9 10*3/uL (ref 1.7–7.7)
PLATELETS: 444 10*3/uL — AB (ref 150–400)
RBC: 2.24 MIL/uL — ABNORMAL LOW (ref 4.22–5.81)
RDW: 13.4 % (ref 11.5–15.5)
WBC: 7.6 10*3/uL (ref 4.0–10.5)

## 2015-12-16 LAB — COMPREHENSIVE METABOLIC PANEL
ALBUMIN: 1.9 g/dL — AB (ref 3.5–5.0)
ALK PHOS: 76 U/L (ref 38–126)
ALT: 16 U/L — ABNORMAL LOW (ref 17–63)
ANION GAP: 8 (ref 5–15)
AST: 22 U/L (ref 15–41)
BUN: 17 mg/dL (ref 6–20)
CALCIUM: 8.2 mg/dL — AB (ref 8.9–10.3)
CHLORIDE: 96 mmol/L — AB (ref 101–111)
CO2: 29 mmol/L (ref 22–32)
Creatinine, Ser: 0.77 mg/dL (ref 0.61–1.24)
GFR calc Af Amer: >60 mL/min (ref >60)
GFR calc non Af Amer: >60 mL/min (ref >60)
GLUCOSE: 121 mg/dL — AB (ref 65–99)
POTASSIUM: 3.9 mmol/L (ref 3.5–5.1)
SODIUM: 133 mmol/L — AB (ref 135–145)
Total Bilirubin: 0.5 mg/dL (ref 0.3–1.2)
Total Protein: 5.2 g/dL — ABNORMAL LOW (ref 6.5–8.1)

## 2015-12-16 MED ORDER — ONDANSETRON HCL 4 MG/2ML IJ SOLN: 4.0000 mg | Freq: Four times a day (QID) | INTRAMUSCULAR | Status: DC | PRN

## 2015-12-16 MED ORDER — ONDANSETRON HCL 4 MG PO TABS
4.0000 mg | ORAL_TABLET | Freq: Four times a day (QID) | ORAL | Status: DC | PRN
  Administered 2015-12-23: 4 mg via ORAL
  Filled 2015-12-16 (×3): qty 1

## 2015-12-16 MED ORDER — OXYCODONE HCL 5 MG PO TABS
10.0000 mg | ORAL_TABLET | ORAL | Status: DC | PRN
  Administered 2015-12-16 – 2015-12-19 (×8): 15 mg via ORAL
  Administered 2015-12-20: 10 mg via ORAL
  Administered 2015-12-20 – 2015-12-26 (×17): 15 mg via ORAL
  Filled 2015-12-16: qty 2
  Filled 2015-12-16 (×9): qty 3
  Filled 2015-12-16: qty 2
  Filled 2015-12-16 (×14): qty 3
  Filled 2015-12-16: qty 2
  Filled 2015-12-16 (×4): qty 3

## 2015-12-16 MED ORDER — DOCUSATE SODIUM 100 MG PO CAPS
100.0000 mg | ORAL_CAPSULE | Freq: Two times a day (BID) | ORAL | Status: DC
  Administered 2015-12-16 – 2015-12-26 (×18): 100 mg via ORAL
  Filled 2015-12-16 (×21): qty 1

## 2015-12-16 MED ORDER — METHOCARBAMOL 500 MG PO TABS
500.0000 mg | ORAL_TABLET | Freq: Four times a day (QID) | ORAL | Status: DC | PRN
  Administered 2015-12-17 – 2015-12-23 (×7): 500 mg via ORAL
  Filled 2015-12-16 (×10): qty 1

## 2015-12-16 MED ORDER — SENNOSIDES-DOCUSATE SODIUM 8.6-50 MG PO TABS
1.0000 | ORAL_TABLET | Freq: Every day | ORAL | Status: DC
  Administered 2015-12-16 – 2015-12-18 (×3): 1 via ORAL
  Filled 2015-12-16 (×2): qty 1

## 2015-12-16 MED ORDER — SORBITOL 70 % SOLN
30.0000 mL | Freq: Every day | Status: DC | PRN
  Administered 2015-12-18 – 2015-12-25 (×2): 30 mL via ORAL
  Filled 2015-12-16 (×3): qty 30

## 2015-12-16 MED ORDER — POLYETHYLENE GLYCOL 3350 17 G PO PACK
17.0000 g | PACK | Freq: Every day | ORAL | Status: DC
  Administered 2015-12-17 – 2015-12-25 (×7): 17 g via ORAL
  Filled 2015-12-16 (×8): qty 1

## 2015-12-16 MED ORDER — ACETAMINOPHEN 325 MG PO TABS: 325.0000 mg | ORAL_TABLET | ORAL | Status: DC | PRN

## 2015-12-16 MED ORDER — OXYCODONE HCL ER 15 MG PO T12A
40.0000 mg | EXTENDED_RELEASE_TABLET | Freq: Two times a day (BID) | ORAL | Status: DC
  Administered 2015-12-16 – 2015-12-27 (×21): 40 mg via ORAL
  Filled 2015-12-16 (×21): qty 1

## 2015-12-16 NOTE — Progress Notes (Signed)
Report called to Verna-RN at [facility], all questions/concerns addressed. Pt will be transported to 4W24. Will continue to monitor

## 2015-12-16 NOTE — Progress Notes (Signed)
Subjective:  Doing well this morning and was able to work with PT. His pain was a little high today, but per med review, pt was not requesting the oxycodone every 4 hours and only received 2 doses yesterday. I reinforced that pt needs to ask the nurse for the oxycodone to help with the pain.  Had bowel movement yesterday after getting mag citrate. The full biopsy results are still pending, and we tried to be supportive as pt was still processing the diagnosis.   He is ready to be transferred to rehab.    Objective: Vital signs in last 24 hours: Filed Vitals:   12/14/15 1429 12/14/15 2135 12/15/15 0558 12/15/15 2058  BP: 117/61 108/56 109/54 105/54  Pulse: 80 53 71 79  Temp: 99.6 F (37.6 C) 99.1 F (37.3 C) 98.9 F (37.2 C) 98.8 F (37.1 C)  TempSrc: Oral Oral Oral Oral  Resp: '18 16 16 16  '$ Height:      Weight:      SpO2: 99% 98% 99% 98%   Weight change:   Intake/Output Summary (Last 24 hours) at 12/16/15 1056 Last data filed at 12/15/15 1700  Gross per 24 hour  Intake    240 ml  Output    350 ml  Net   -110 ml    General: Vital signs reviewed. Patient in NAD Cardiovascular: regular rate, rhythm, no murmurs Pulmonary/Chest: CTAB Abdominal: Soft, non-tender, non-distended, BS + Extremities:   Has bandaged at the inferior left femur site- looks c/d/i.  No swelling or warmth in either knee     Lab Results: No results found for this or any previous visit (from the past 24 hour(s)).  Micro Results: Recent Results (from the past 240 hour(s))  Surgical pcr screen     Status: None   Collection Time: 12/08/15  9:45 PM  Result Value Ref Range Status   MRSA, PCR NEGATIVE NEGATIVE Final   Staphylococcus aureus NEGATIVE NEGATIVE Final    Comment:        The Xpert SA Assay (FDA approved for NASAL specimens in patients over 47 years of age), is one component of a comprehensive surveillance program.  Test performance has been validated by [facility] for patients  greater than or equal to 47 year old. It is not intended to diagnose infection nor to guide or monitor treatment.    Studies/Results: No results found. Medications: I have reviewed the patient's current medications. Scheduled Meds: . acetaminophen  1,000 mg Oral Q6H  . bisacodyl  10 mg Rectal Daily  . docusate sodium  100 mg Oral BID  . ketorolac  15 mg Intravenous Q6H  . oxyCODONE  40 mg Oral Q12H  . polyethylene glycol  17 g Oral Daily  . senna-docusate  1 tablet Oral QHS   Continuous Infusions:   PRN Meds:.alum & mag hydroxide-simeth, HYDROmorphone (DILAUDID) injection, menthol-cetylpyridinium **OR** phenol, methocarbamol **OR** [DISCONTINUED] methocarbamol (ROBAXIN)  IV, ondansetron **OR** ondansetron (ZOFRAN) IV, oxyCODONE Assessment/Plan: Active Problems:   Femur fracture (HCC)   Femur fracture, left (HCC)   Closed fracture of shaft of left femur (HCC)   Swelling of right testicle   Fracture   Mass of soft tissue   Pain   Effusion of knee  Metastatic cancer:  Patient underwent a CT guided biopsy of the several soft tissue masses. 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 Further testing is pending and pt will be [facility] at the [facility]. Pt has been evaluated  by PM&R who recommends inpatient rehab. Pt's post-op pain is much better controlled after we increased his long-acting narcotic dose. Bone scan shows no additional bone involvement. Left knee xray shows a small effusion but otherwise no other changes and is s/p ORIF.  Pt is ok for discharge to Rehab from our and  orthopaedics standpoint and we appreciate PM&R help.  -Consulted Oncology- following and appreciate recs, set up outpt appt    Pathologic left femur fracture:  s/p IM nail of left femur. PT recommending CIR   -Oxycodone to 10-15 mg q4 hours PRN  -IV dilaudid 2-4 mg q4 hours PRN -Oxycontin to 40 mg BID -Scheduled Tylenol '1000mg'$  Q6hr and Toradol '15mg'$   Q6hr. -Increase bowel regimen. Now on Docusate '100mg'$  BID, Miralax daily, Senokot-S QHS.  Constipation: Pt had bowel movement after getting mag citrate. I reinforced that the pain medicines will make him constipated and it is important to take the senna and docusate to have a regimen.  -docusate 100 mg bid -miralax daily -senokot QHS -dulcolax suppository  -mag citrate   DVT Ppx: Pt is on lovenox 40 mg daily  Diet- reg  Dispo: Disposition is deferred at this time, awaiting improvement of current medical problems.  Anticipated discharge in approximately 3 day(s).   The patient does have a current PCP (No Pcp Per Patient) and does not need an [facility] hospital follow-up appointment after discharge.  The patient does not have transportation limitations that hinder transportation to clinic appointments.  .Services Needed at time of discharge: Y = Yes, Blank = No PT:   OT:   RN:   Equipment:   Other:     LOS: 10 days   Marvin Estelle, MD 12/16/2015, 10:56 AM

## 2015-12-16 NOTE — Care Management Note (Signed)
Case Management Note  Patient Details  Name: Marvin Hardy MRN: [id number] Date of Birth: 19-Dec-1960  Subjective/Objective:  55 yr old gentleman s/p left femur IM Nailing.                   Action/Plan:  Patient will be going to CIR.   Expected Discharge Date:  12/16/15  Choice offered to:  NA, Patient  DME Arranged:  N/A DME Agency:  NA  HH Arranged:  NA HH Agency:  NA  Status of Service:  Completed, signed off  Medicare Important Message Given:    Date Medicare IM Given:    Medicare IM give by:    Date Additional Medicare IM Given:    Additional Medicare Important Message give by:     If discussed at Reader of Stay Meetings, dates discussed:    Additional Comments:  Ninfa Meeker, RN 12/16/2015, 3:03 PM

## 2015-12-16 NOTE — PMR Pre-admission (Signed)
PMR Admission Coordinator Pre-Admission Assessment  Patient: Marvin Hardy is an 55 y.o., male MRN: [id number] DOB: 08-25-1960 Height: 6' 6"  (198.1 cm) Weight: 73.211 kg (161 lb 6.4 oz)              Insurance Information HMO:     PPO:       PCP:       IPA:       80/20:       OTHER:  POS plan PRIMARY: [employer] [place]      Policy#: [id number]      Subscriber: [name] CM Name: [name]      Phone#: [phone]     Fax#: [phone] Pre-Cert#: [id number] X 7 days with update due 12/23/15      Employer: Worked 6 days a week Benefits:  Phone #: [phone]     Name:  [name]. Date: 08/02/13     Deduct: $2500 (met $2102.39)      Out of Pocket Max: $6000 (met $2102.39)      Life Max: unlimited CIR: 75%      SNF: 75% with 120 days max Outpatient: 75% with 40 visits max     Co-Pay: 25% Home Health: 75%      Co-Pay: 25% DME: 75%     Co-Pay: 25% Providers: in network  Medicaid Application Date:        Case Manager:   Disability Application Date:        Case Worker:    Emergency Contact Information Contact Information    Name Relation Home Work [place] Sister   [place] Daughter   [place] Niece   [phone]   [name] [phone]       Current Medical History  Patient Admitting Diagnosis:  Pathologic Left femur fracture, metastatic cancer  History of Present Illness: A 55 y.o. right handed male with history of tobacco abuse. Presented 12/06/2015 when he felt a pop in his left leg while twisting while at work. Denied any fall. Patient had been living with a friend and was independent prior to admission. He currently is unsure of his discharge plan as he has no local family. He may opt to go back to [place] and stay with his sister.  X-rays and imaging revealed left displaced transverse fractures of the mid femoral diaphysis as well as irregular lytic lesion and periosteal reaction involving the mid femoral  diaphysis at the fracture site concerning for possibility of metastatic disease or multiple myeloma. CT abdomen and pelvis as well as CT of the chest showed a 2.5 x 3.6 right lower lobe lung mass suspicious for neoplasm.Bilateral adrenal masses. 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 Lytic lesions of the femoral neck and intertrochanteric region But no evidence of skeletal metastasis elsewhere. 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 Follow-up oncology service of Dr. Earlie Server and Plan follow-up outpatient with plan of care. Underwent intramedullary nail placement left femur 12/09/2015 per Dr. Ninfa Linden. Hospital course pain management. Acute blood loss anemia 9.4 and monitored. Weightbearing as tolerated. Physical therapy evaluation completed with recommendations of physical medicine rehabilitation consult. Patient to be admitted for a comprehensive inpatient  rehabilitation program.     Past Medical History  History reviewed. No pertinent past medical history.  Family History  family history includes Diabetes in his mother;  Heart disease in his mother; Hypertension in his mother; Lung cancer in his father.  Prior Rehab/Hospitalizations: No previous rehab.   Has the patient had major surgery during 100 days prior to admission? No  Current Medications   Current facility-administered medications:  .  acetaminophen (TYLENOL) tablet 1,000 mg, 1,000 mg, Oral, Q6H, Milagros Loll, MD, 1,000 mg at 12/15/15 2331 .  alum & mag hydroxide-simeth (MAALOX/MYLANTA) 200-200-20 MG/5ML suspension 30 mL, 30 mL, Oral, Q4H PRN, Mcarthur Rossetti, MD .  bisacodyl (DULCOLAX) suppository 10 mg, 10 mg, Rectal, Daily, Burgess Estelle, MD, 10 mg at 12/15/15 1858 .  docusate sodium (COLACE) capsule 100 mg, 100 mg, Oral, BID, Mcarthur Rossetti, MD, 100 mg at 12/16/15 0944 .  HYDROmorphone (DILAUDID) injection 2-4 mg, 2-4 mg, Intravenous, Q4H PRN, Milagros Loll, MD .  ketorolac (TORADOL) 15 MG/ML injection 15 mg, 15 mg, Intravenous, Q6H, Milagros Loll, MD, 15 mg at 12/16/15 0535 .  menthol-cetylpyridinium (CEPACOL) lozenge 3 mg, 1 lozenge, Oral, PRN **OR** phenol (CHLORASEPTIC) mouth spray 1 spray, 1 spray, Mouth/Throat, PRN, Mcarthur Rossetti, MD .  methocarbamol (ROBAXIN) tablet 500 mg, 500 mg, Oral, Q6H PRN, 500 mg at 12/16/15 1038 **OR** [DISCONTINUED] methocarbamol (ROBAXIN) 500 mg in dextrose 5 % 50 mL IVPB, 500 mg, Intravenous, Q6H PRN, Mcarthur Rossetti, MD, 500 mg at 12/09/15 1716 .  ondansetron (ZOFRAN) tablet 4 mg, 4 mg, Oral, Q6H PRN **OR** ondansetron (ZOFRAN) injection 4 mg, 4 mg, Intravenous, Q6H PRN, Mcarthur Rossetti, MD .  oxyCODONE (Oxy IR/ROXICODONE) immediate release tablet 10-15 mg, 10-15 mg, Oral, Q4H PRN, Milagros Loll, MD, 15 mg at 12/16/15 1039 .  oxyCODONE (OXYCONTIN) 12 hr tablet 40 mg, 40 mg, Oral, Q12H, Milagros Loll, MD, 40 mg at 12/16/15 0944 .  polyethylene glycol (MIRALAX / GLYCOLAX) packet 17 g, 17 g, Oral, Daily, Milagros Loll, MD, 17 g at 12/16/15 0946 .  senna-docusate (Senokot-S) tablet 1 tablet, 1 tablet, Oral, QHS, Milagros Loll, MD, 1 tablet at 12/15/15 2330  Patients Current Diet: Diet regular Room service appropriate?: Yes; Fluid consistency:: Thin Diet - low sodium heart healthy  Precautions / Restrictions Precautions Precautions: Fall Restrictions Weight Bearing Restrictions: No LLE Weight Bearing: Weight bearing as tolerated   Has the patient had 2 or more falls or a fall with injury in the past year?No  Prior Activity Level Went out daily.  Worked 6 days a week.  Home Assistive Devices / Equipment Home Assistive Devices/Equipment: None Home Equipment: Cane - single point  Prior Device Use: Indicate devices/aids used by the patient prior  to current illness, exacerbation or injury? Cane.  He states that he has been using the cane for the last 2-3 months due to pain in left leg.  Prior Functional Level Prior Function Level of Independence: Independent with assistive device(s) Comments: Used the cane all the time.   Self Care: Did the patient need help bathing, dressing, using the toilet or eating?  Independent  Indoor Mobility: Did the patient need assistance with walking from room to room (with or without device)? Independent  Stairs: Did the patient need assistance with internal or external stairs (with or without device)? Independent  Functional Cognition: Did the patient need help planning regular tasks such as shopping or remembering to take medications? Independent  Current Functional Level Cognition  Overall Cognitive Status: Within Functional Limits for tasks assessed Orientation Level: Oriented X4    Extremity Assessment (includes Sensation/Coordination)  Upper Extremity Assessment:  Generalized weakness  Lower Extremity Assessment: Defer to PT evaluation LLE Deficits / Details: Pt declined to move LLE    ADLs  Overall ADL's : Needs assistance/impaired Eating/Feeding: Independent, Sitting Grooming: Min guard, Standing Upper Body Bathing: Set up, Sitting Lower Body Bathing: Minimal assistance (with min A sit<>stand) Upper Body Dressing : Set up, Sitting Lower Body Dressing: Minimal assistance, With adaptive equipment (min A sit<>stand) Toilet Transfer: Minimal assistance, Ambulation (Bed>out door and down hallway> sit in recliner behind him) General ADL Comments: Eval limited to bed level due to pain. Pt very flat during session. States he did'nt know what he was going to do. limited mobility in bed due to pain. Attempted to reposition LLE unsuccessfully. Pt declined getting out of bed this pm.     Mobility  Overal bed mobility: Needs Assistance Bed Mobility: Supine to Sit Supine to sit: Min  guard General bed mobility comments: HOB up and use of rail    Transfers  Overall transfer level: Needs assistance Equipment used: Rolling walker (2 wheeled) Transfers: Sit to/from Stand Sit to Stand: Min guard Stand pivot transfers: Min assist, +2 physical assistance General transfer comment: Pt remains to push through RW vs.  pushing from seated surface.  Pt required cues for advancement of LLE.      Ambulation / Gait / Stairs / Wheelchair Mobility  Ambulation/Gait Ambulation/Gait assistance: Min guard, Supervision Ambulation Distance (Feet): 100 Feet Assistive device: Rolling walker (2 wheeled) Gait Pattern/deviations: Step-through pattern, Decreased stride length, Wide base of support, Decreased stance time - left General Gait Details: Pt required cues for sequencing, RW position and forward gaze.  Pt able to advance gait distance and required cues for increased L knee flexion in swing phase and L heel strike.   Gait velocity: decreased    Posture / Balance Dynamic Sitting Balance Sitting balance - Comments: Due to pain Balance Overall balance assessment: Needs assistance Sitting-balance support: Feet supported, No upper extremity supported Sitting balance-Leahy Scale: Good Sitting balance - Comments: Due to pain Postural control: Posterior lean, Right lateral lean Standing balance support: Bilateral upper extremity supported, During functional activity Standing balance-Leahy Scale: Fair Standing balance comment: reliant on RW    Special needs/care consideration BiPAP/CPAP No CPM No Continuous Drip IV No Dialysis No        Life Vest No Oxygen No Special Bed No Trach Size No Wound Vac (area) No     Skin Has surgical incision left hip/knee and biopsy site left chest area                            Bowel mgmt: Had BM X 4 on 12/15/15 per patient Bladder mgmt: Voiding in urinal Diabetic mgmt: No    Previous Home Environment Living Arrangements:  Non-relatives/Friends Available Help at Discharge: Family, Available PRN/intermittently Type of Home: House Home Access:  (Unsure) Home Care Services: No Additional Comments: Pt states "essentailly I'm homeless". Pt states he lives with a friend who has moved and he doesn't know where he will go after D/C.  Discharge Living Setting Plans for Discharge Living Setting: Other (Comment) (May go home to [place] with sister, Lavella Hammock.) Does the patient have any problems obtaining your medications?: No  Social/Family/Support Systems Patient Roles: Spouse, Parent, Other (Comment) (Separated from wife, has 2 children, sister, niece, and a brother in [place]. Contact Information: Janith Lima - sister [phone] Anticipated Caregiver: self Ability/Limitations of Caregiver: May go home with sister in [place] Caregiver  Availability: Intermittent Discharge Plan Discussed with Primary Caregiver: No (Discussed plan with patient.) Is Caregiver In Agreement with Plan?: Yes (Not sure of caregiver support yet.) Does Caregiver/Family have Issues with Lodging/Transportation while Pt is in Rehab?: No (Family are in [place], brother in [place].)  Goals/Additional Needs Patient/Family Goal for Rehab: PT/OT mod I goals Expected length of stay: 7-10 days Cultural Considerations: [employer] Dietary Needs: Regular diet, thin liquids Equipment Needs: TBD Additional Information: Separated from wife for 3 years.  Moved to [place] to find work.  Has been in [place] for about 1 1/2 years.  Stayed with friends, but essentially homeless.  Has no place to stay here in [place]. Pt/Family Agrees to Admission and willing to participate: Yes (Patient agrees to admission) Program Orientation Provided & Reviewed with Pt/Caregiver Including Roles  & Responsibilities: Yes (Reviewed with patient.)  Decrease burden of Care through IP rehab admission: N/A  Possible need for SNF placement upon discharge: Not planned  Patient  Condition: This patient's medical and functional status has changed since the consult dated: 12/11/15 in which the Rehabilitation Physician determined and documented that the patient's condition is appropriate for intensive rehabilitative care in an inpatient rehabilitation facility. See "History of Present Illness" (above) for medical update. Functional changes are: Currently requiring minguard assist to ambulate 100 feet RW. Patient's medical and functional status update has been discussed with the Rehabilitation physician and patient remains appropriate for inpatient rehabilitation. Will admit to inpatient rehab today.  Preadmission Screen Completed By:  Retta Diones, 12/16/2015 12:33 PM ______________________________________________________________________   Discussed status with Dr. Naaman Plummer on 12/16/15 at 1233 and received telephone approval for admission today.  Admission Coordinator:  Retta Diones, time1233/Date05/16/17

## 2015-12-16 NOTE — Clinical Social Work Note (Signed)
Patient to be discharged to [facility] IP Rehab. LCSW signing off.  Lubertha Sayres, [facility]: [phone] Surgical: [phone]

## 2015-12-16 NOTE — Interval H&P Note (Signed)
Marvin Hardy was admitted today to Inpatient Rehabilitation with the diagnosis of pathological left femur fracture.  The patient's history has been reviewed, patient examined, and there is no change in status.  Patient continues to be appropriate for intensive inpatient rehabilitation.  I have reviewed the patient's chart and labs.  Questions were answered to the patient's satisfaction. The PAPE has been reviewed and assessment remains appropriate.  SWARTZ,ZACHARY T 12/16/2015, 7:59 PM

## 2015-12-16 NOTE — Progress Notes (Signed)
Physical Therapy Treatment Patient Details Name: Marvin Hardy MRN: [id number] DOB: 07/04/1961 Today's Date: 12/16/2015    History of Present Illness Pt is a 55 yo M with no significant past medical history presenting to the hospital with pain in his left lower extremity. Patient states he was at work and twisted his left leg while trying to turn while walking down a step. States he heard his "bone pop" when this happened. Denies having any history of fractures in the past. 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 He is now s/p IM nailing and is WBAT.    PT Comments    Pt performed increased mobility with decreased assist.  Pt independent prior to admission and remains a strong candidate for CIR to progress to PLOF.  Pt motivated to improve strength and function.    Follow Up Recommendations  CIR     Equipment Recommendations  Rolling walker with 5" wheels;3in1 (PT)    Recommendations for Other Services Rehab consult     Precautions / Restrictions Precautions Precautions: Fall Restrictions Weight Bearing Restrictions: No LLE Weight Bearing: Weight bearing as tolerated    Mobility  Bed Mobility Overal bed mobility: Needs Assistance Bed Mobility: Supine to Sit     Supine to sit: Min guard     General bed mobility comments: HOB up and use of rail  Transfers Overall transfer level: Needs assistance Equipment used: Rolling walker (2 wheeled) Transfers: Sit to/from Stand Sit to Stand: Min guard         General transfer comment: Pt remains to push through RW vs.  pushing from seated surface.  Pt required cues for advancement of LLE.    Ambulation/Gait Ambulation/Gait assistance: Min guard;Supervision Ambulation Distance (Feet): 100 Feet Assistive device: Rolling walker (2 wheeled) Gait Pattern/deviations: Step-through pattern;Decreased stride length;Wide base of support;Decreased stance time - left Gait  velocity: decreased   General Gait Details: Pt required cues for sequencing, RW position and forward gaze.  Pt able to advance gait distance and required cues for increased L knee flexion in swing phase and L heel strike.     Stairs            Wheelchair Mobility    Modified Rankin (Stroke Patients Only)       Balance Overall balance assessment: Needs assistance   Sitting balance-Leahy Scale: Good Sitting balance - Comments: Due to pain     Standing balance-Leahy Scale: Fair                      Cognition Arousal/Alertness: Awake/alert Behavior During Therapy: WFL for tasks assessed/performed Overall Cognitive Status: Within Functional Limits for tasks assessed                      Exercises Total Joint Exercises Ankle Circles/Pumps:  (deferred ther ex due to pain.  )    General Comments        Pertinent Vitals/Pain Pain Assessment: 0-10 Pain Score: 8  Pain Location: LLE during movement.   Pain Descriptors / Indicators: Sore;Grimacing;Guarding Pain Intervention(s): Monitored during session;Repositioned    Home Living                      Prior Function            PT Goals (current goals can now be found in the care plan section) Acute Rehab PT Goals Patient Stated Goal: Decrease  pain Potential to Achieve Goals: Good Progress towards PT goals: Progressing toward goals    Frequency  Min 5X/week    PT Plan Current plan remains appropriate    Co-evaluation             End of Session Equipment Utilized During Treatment: Gait belt Activity Tolerance: Patient limited by pain Patient left: in chair;with call bell/phone within reach     Time: 1019-1045 PT Time Calculation (min) (ACUTE ONLY): 26 min  Charges:  $Gait Training: 8-22 mins $Therapeutic Activity: 8-22 mins                    G Codes:      Cristela Blue 12/20/2015, 10:51 AM  Governor Rooks, PTA pager [phone]

## 2015-12-16 NOTE — Discharge Summary (Signed)
Updated Discharge summary as of 12/16/2015  Name: Marvin Hardy MRN: [id number] DOB: 1961/02/07 55 y.o. PCP: No Pcp Per Patient  Date of Admission: 12/06/2015  2:49 PM Date of Discharge: 12/16/2015 Attending Physician: Annia Belt, MD, and Larey Dresser, MD  Discharge Diagnosis: 1. 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  Active Problems:   Femur fracture (HCC)   Femur fracture, left (HCC)   Closed fracture of shaft of left femur (HCC)   Swelling of right testicle   Fracture   Mass of soft tissue   Pain   Effusion of knee  Discharge Medications:   Medication List    STOP taking these medications        BC FAST PAIN RELIEF 650-195-33.3 MG Pack  Generic drug:  Aspirin-Salicylamide-Caffeine     naproxen 500 MG tablet  Commonly known as:  NAPROSYN      TAKE these medications        acetaminophen 500 MG tablet  Commonly known as:  TYLENOL  Take 1 tablet (500 mg total) by mouth every 6 (six) hours as needed.     alum & mag hydroxide-simeth 200-200-20 MG/5ML suspension  Commonly known as:  MAALOX/MYLANTA  Take 30 mLs by mouth every 4 (four) hours as needed for indigestion.     bisacodyl 10 MG suppository  Commonly known as:  DULCOLAX  Place 1 suppository (10 mg total) rectally daily.     docusate sodium 100 MG capsule  Commonly known as:  COLACE  Take 1 capsule (100 mg total) by mouth 2 (two) times daily.     ibuprofen 800 MG tablet  Commonly known as:  ADVIL,MOTRIN  Take 1 tablet (800 mg total) by mouth every 8 (eight) hours as needed for moderate pain.     Oxycodone HCl 10 MG Tabs  Take 1-1.5 tablets (10-15 mg total) by mouth every 4 (four) hours as needed for severe pain ((for MODERATE breakthrough pain)).     oxyCODONE 40 mg 12 hr tablet  Commonly known as:  OXYCONTIN  Take 1 tablet (40 mg total) by mouth every 12 (twelve) hours.     polyethylene glycol packet  Commonly known as:  MIRALAX / GLYCOLAX  Take 17  g by mouth daily.     senna-docusate 8.6-50 MG tablet  Commonly known as:  Senokot-S  Take 1 tablet by mouth at bedtime.        Disposition and follow-up:   Mr.Irbin Mcenroe was discharged from [facility] in Stable condition.  At the hospital follow up visit please address:  1.  Metastatic cancer: Pt will be going to [facility] after being discharged from inpatient rehab. They have been notified.  Left femur fracture: Pt to follow up with orthopedics for post-operative visit , depending on the length of stay in the rehab unit.  Kindly request the rehab to make the appointment after they have a better idea of the discharge from their unit.   2.  Labs / imaging needed at time of follow-up:   3.  Pending labs/ test needing follow-up:   Follow-up Appointments: Follow-up Information    Follow up with Mcarthur Rossetti, MD. Schedule an appointment as soon as possible for a visit in 2 weeks.   Specialty:  Orthopedic Surgery   Contact information:   [place]  [address] [phone]       Follow up with Eilleen Kempf., MD. Go in 1 week.   Specialty:  Oncology  Why:  [facility]- new patient visit   Contact information:   [place] [address] [phone]       Discharge Instructions: Discharge Instructions    Diet - low sodium heart healthy    Complete by:  As directed      Discharge instructions    Complete by:  As directed   Please follow up in the [place] [facility]- they will contact you to make the appointment with Dr. Julien Nordmann. At that time, they will go over more details regarding the results and discuss further management.  Please continue the physical therapy Please take the senna and docusate as needed for constipation. Also prescribed dulcolax suppository. The pain medications can make you constipated so it is important for you to take these.     Increase activity  slowly    Complete by:  As directed            Consultations: Treatment Team:  Mcarthur Rossetti, MD Curt Bears, MD  Procedures Performed:  Dg Chest 1 View  12/06/2015  CLINICAL DATA:  Left femur fracture. Preoperative respiratory examination. EXAM: CHEST 1 VIEW COMPARISON:  None. FINDINGS: The cardiomediastinal silhouette is unremarkable. Pulmonary vascular congestion is noted. There is no evidence of focal airspace disease, pulmonary edema, suspicious pulmonary nodule/mass, pleural effusion, or pneumothorax. No acute bony abnormalities are identified. IMPRESSION: Pulmonary vascular congestion. Electronically Signed   By: Margarette Canada M.D.   On: 12/06/2015 16:35   Dg Knee 1-2 Views Left  12/13/2015  CLINICAL DATA:  Pt reports tightness in left knee x 2 days after sitting up for a while after doing therapy; swelling to the left knee also but pt reports it was there prior to surgery and appears to have decreased; pt had a left IM nail. EXAM: LEFT KNEE - 1-2 VIEW COMPARISON:  None available FINDINGS: Lower portion of an intra medullary nail is identified in the distal femur. Surgical clips overlie the distal medial thigh. No acute fracture or subluxation identified. Small joint effusion suspected. IMPRESSION: Status post ORIF.  Small joint effusion. Electronically Signed   By: Nolon Nations M.D.   On: 12/13/2015 15:02   Ct Chest W Contrast  12/06/2015  CLINICAL DATA:  Multiple myeloma.  Fell today. EXAM: CT CHEST, ABDOMEN, AND PELVIS WITH CONTRAST TECHNIQUE: Multidetector CT imaging of the chest, abdomen and pelvis was performed following the standard protocol during bolus administration of intravenous contrast. CONTRAST:  141m ISOVUE-300 IOPAMIDOL (ISOVUE-300) INJECTION 61% COMPARISON:  None. FINDINGS: CT CHEST No pneumothorax. No effusions. No acute fracture. No intrathoracic vascular injury. No mediastinal or hilar adenopathy. Axillary regions are unremarkable. There is a 2.5 x 3.6 cm  mass in the posterior basal segment of the right lower lobe, suspicious for neoplasm. Extensive centrilobular emphysematous changes are present in the upper lobes. CT ABDOMEN AND PELVIS No acute traumatic findings. No peritoneal blood or free air. Liver, spleen, pancreas and kidneys are unremarkable and intact. Aorta and IVC are normal in caliber, intact. There are adrenal masses bilaterally, measuring approximately 2 cm. There also is a 2.2 cm mass at the upper pole the right kidney which might represent a far posterior right adrenal nodule. The adrenals are poorly defined due to the marked paucity of surrounding fat. Bowel is unremarkable. Small volume ascites. No acute fracture is evident in the chest, abdomen or pelvis. There is a destructive bone lesion with associated soft tissue mass at the anterior aspect of the  left femur greater trochanter, seen to best advantage on coronal images 76 series 304. This soft tissue component measures 1.9 x 2.5 cm. There also are lytic defects of the left femoral subcapital and intertrochanteric regions which are worrisome for risk of pathologic fracture. IMPRESSION: 1. No evidence of acute traumatic injury in the chest, abdomen or pelvis. 2. 2.5 x 3.6 cm right lower lobe lung mass, suspicious for neoplasm. 3. Bilateral adrenal masses. There also is a 2.2 cm right suprarenal mass more posteriorly which may represent an additional adrenal mass. 4. Lytic lesions of the left femoral neck and intertrochanteric region, worrisome for risk for pathologic fracture. Electronically Signed   By: Andreas Newport M.D.   On: 12/06/2015 19:46   Mr [name] ZY Contrast  12/07/2015  CLINICAL DATA:  55 year old male with pathologic fracture left femur. Lung lesion. Evaluate for intracranial metastatic disease. Initial encounter. EXAM: MRI HEAD WITHOUT AND WITH CONTRAST TECHNIQUE: Multiplanar, multiecho pulse sequences of the brain and surrounding structures were obtained without and with  intravenous contrast. CONTRAST:  18m MULTIHANCE GADOBENATE DIMEGLUMINE 529 MG/ML IV SOLN COMPARISON:  None. FINDINGS: Exam is motion degraded. In particular, post-contrast sequences are significantly motion degraded limiting evaluation for detection of subtle abnormality. No intracranial enhancing lesion detected. 4.1 x 2.6 x 2.2 cm mass adjacent to the left lateral pterygoid muscle. This is suspicious for possibility of metastatic disease. Given the motion degradation and this finding, contrast-enhanced CT of the head and neck recommended for further delineation. No acute infarct or intracranial hemorrhage. Major intracranial vascular structures appear patent. Abnormal signal intensity bone marrow upper cervical spine raises possibility of infiltration by tumor or result of underlying anemia. Slightly heterogeneous bone marrow of the calvarium. Attention to the calvarium on CT bone windows. IMPRESSION: Post-contrast sequences are significantly motion degraded limiting evaluation for detection of subtle abnormality. No intracranial enhancing lesion detected. 4.1 x 2.6 x 2.2 cm mass adjacent to the left lateral pterygoid muscle. This is suspicious for possibility of metastatic disease. Given the motion degradation and this finding, contrast-enhanced CT of the head and neck recommended for further delineation. Abnormal signal intensity of bone marrow of the upper cervical spine raises possibility of infiltration by tumor or result of underlying anemia. Slightly heterogeneous bone marrow of the calvarium. Attention to the calvarium on CT bone windows to evaluate for possibility of myeloma. Electronically Signed   By: SGenia DelM.D.   On: 12/07/2015 13:26   Nm Bone Scan Whole Body  12/12/2015  CLINICAL DATA:  Pathologic left femur fracture. EXAM: NUCLEAR MEDICINE WHOLE BODY BONE SCAN TECHNIQUE: Whole body anterior and posterior images were obtained approximately 3 hours after intravenous injection of  radiopharmaceutical. RADIOPHARMACEUTICALS:  26 mCi Technetium-960mDP IV COMPARISON:  Body CT 12/06/2015 FINDINGS: Activity in the mid left femur related to known pathologic fracture. Less intense activity at the left femoral neck and intertrochanteric femur related to discrete metastases seen by CT. Status post left femur ORIF. No suspicious activity elsewhere for osseous metastatic disease. Normal tracer distribution with bilateral renal and bladder activity seen. IMPRESSION: Abnormal activity in the proximal and mid left femur related to known metastases and pathologic diaphysis fracture. No evidence of skeletal metastasis elsewhere. Electronically Signed   By: JoMonte Fantasia.D.   On: 12/12/2015 16:57   UsKoreacrotum  12/07/2015  CLINICAL DATA:  Right scrotal swelling for 2 months EXAM: ULTRASOUND OF SCROTUM TECHNIQUE: Complete ultrasound examination of the testicles, epididymis, and other scrotal structures was performed. COMPARISON:  None. FINDINGS: Right testicle Measurements: 3.5 x 3.1 x 2.7 cm. There is a 3 x 3 x 2 mm cyst in the right testis. No noncystic mass or microlithiasis visualized. Color flow is seen in the right testis. Left testicle Measurements: 4.3 x 3.5 x 2.8 cm. There is a 3 x 3 x 2 mm cyst in the left testis. No noncystic mass or microlithiasis visualized. Color flow is seen in the left testis. Right epididymis:  Normal in size and appearance. Left epididymis: No inflammatory focus. There are multiple cysts arising from the epididymal head, largest measuring 1 x 1 x 0.8 cm. Hydrocele: There is a large hydrocele on the right. There is a much smaller hydrocele on the left. Varicocele:  None visualized. No scrotal wall thickening or scrotal abscess. IMPRESSION: Sizable right hydrocele of uncertain etiology. Small left hydrocele. There are several epididymal head cysts/spermatoceles on the left. There is no epididymal inflammation on either side. There is a tiny cyst in each testis. Etiology  uncertain. No noncystic testicular mass is identified. Given these small intratesticular cysts, a followup study in 3-4 months to assess for stability would be reasonable. Color flow is appreciated in each testis. Electronically Signed   By: Lowella Grip III M.D.   On: 12/07/2015 08:53   Ct Abdomen Pelvis W Contrast  12/06/2015  CLINICAL DATA:  Multiple myeloma.  Fell today. EXAM: CT CHEST, ABDOMEN, AND PELVIS WITH CONTRAST TECHNIQUE: Multidetector CT imaging of the chest, abdomen and pelvis was performed following the standard protocol during bolus administration of intravenous contrast. CONTRAST:  110m ISOVUE-300 IOPAMIDOL (ISOVUE-300) INJECTION 61% COMPARISON:  None. FINDINGS: CT CHEST No pneumothorax. No effusions. No acute fracture. No intrathoracic vascular injury. No mediastinal or hilar adenopathy. Axillary regions are unremarkable. There is a 2.5 x 3.6 cm mass in the posterior basal segment of the right lower lobe, suspicious for neoplasm. Extensive centrilobular emphysematous changes are present in the upper lobes. CT ABDOMEN AND PELVIS No acute traumatic findings. No peritoneal blood or free air. Liver, spleen, pancreas and kidneys are unremarkable and intact. Aorta and IVC are normal in caliber, intact. There are adrenal masses bilaterally, measuring approximately 2 cm. There also is a 2.2 cm mass at the upper pole the right kidney which might represent a far posterior right adrenal nodule. The adrenals are poorly defined due to the marked paucity of surrounding fat. Bowel is unremarkable. Small volume ascites. No acute fracture is evident in the chest, abdomen or pelvis. There is a destructive bone lesion with associated soft tissue mass at the anterior aspect of the left femur greater trochanter, seen to best advantage on coronal images 76 series 304. This soft tissue component measures 1.9 x 2.5 cm. There also are lytic defects of the left femoral subcapital and intertrochanteric regions which  are worrisome for risk of pathologic fracture. IMPRESSION: 1. No evidence of acute traumatic injury in the chest, abdomen or pelvis. 2. 2.5 x 3.6 cm right lower lobe lung mass, suspicious for neoplasm. 3. Bilateral adrenal masses. There also is a 2.2 cm right suprarenal mass more posteriorly which may represent an additional adrenal mass. 4. Lytic lesions of the left femoral neck and intertrochanteric region, worrisome for risk for pathologic fracture. Electronically Signed   By: DAndreas NewportM.D.   On: 12/06/2015 19:46   Ct Femur Left Wo Contrast  12/06/2015  CLINICAL DATA:  55year old male with a history of fracture left femur EXAM: CT OF THE LOWER LEFT EXTREMITY WITHOUT CONTRAST TECHNIQUE: Multidetector  CT imaging of the was performed according to the standard protocol. COMPARISON:  Plain film of today's date. Contemporaneously CT of the abdomen/pelvis FINDINGS: Transverse fracture in the mid femur with approximately 26 degrees of lateral angulation at the fracture site. There is medial displacement of the distal fracture fragment by 1/2 shaft width. Soft tissue somewhat limited by the exclusion of IV contrast, however, there is abnormal soft tissue centered at the site of the fracture involving the type low ex base and expanding outside of the cortex circumferentially at the fracture site. Differentiating abnormal/pathologic soft tissue from hematoma is difficult. Permeative changes of the cortex at the fracture site with mild periosteal reaction and a wide zone of transition at the lesion. Lytic lesion at the greater trochanter, the femoral head neck junction, and the posterior femoral neck. Partially imaged right hydrocele versus lesion. IMPRESSION: Pathologic fracture at the mid left femoral diaphysis, with approximately 26 degrees of angulation at the fracture site, and permeative bone changes of the cortex. There is abnormal soft tissue within the medullary space centered at the fracture site, with  circumferential soft tissue about the femur, and a wide zone of transition. The soft tissue may either represent a primary osteosarcoma, or metastatic disease. Additional lytic lesions of the femur involves the femoral neck junction, posterior femoral neck, and the greater trochanter, compatible with metastatic disease. Partially imaged right-sided hydrocele versus scrotal lesion. Recommend correlation with physical exam. Signed, Dulcy Fanny. Earleen Newport, DO [facility] Electronically Signed   By: Corrie Mckusick D.O.   On: 12/06/2015 19:46   US Biopsy  12/08/2015  INDICATION: Left chest wall palpable mass, right lower lobe mass, concern for metastatic disease EXAM: ULTRASOUND GUIDED LEFT CHEST WALL SOFT TISSUE MASS BIOPSY MEDICATIONS: 1% LIDOCAINE LOCALLY ANESTHESIA/SEDATION: Versed 1.5 mg IV Moderate Sedation Time:  10 minutes The patient was continuously monitored during the procedure by the interventional radiology nurse under my direct supervision. PROCEDURE: The procedure, risks, benefits, and alternatives were explained to the patient. Questions regarding the procedure were encouraged and answered. The patient understands and consents to the procedure. The left chest wall soft tissue mass was prepped with ChloraPrep in a sterile fashion, and a sterile drape was applied covering the operative field. A sterile gown and sterile gloves were used for the procedure. Local anesthesia was provided with 1% Lidocaine. Previous imaging reviewed. Preliminary ultrasound performed. The left chest wall soft tissue mass was imaged with ultrasound. Overlying skin was marked. Under sterile conditions and local anesthesia, a 17 gauge coaxial needle was advanced into the lesion under direct ultrasound. Images obtained for documentation. 3 18 gauge core biopsies obtained through the access. Needle tract embolized with Gel-Foam. Samples were placed in formalin. No immediate  complication. Patient tolerated the procedure well. COMPLICATIONS: None immediate. FINDINGS: Imaging confirms needle placed in in the left chest wall mass for core biopsy IMPRESSION: Successful ultrasound left chest wall soft tissue mass 18 gauge core biopsy Electronically Signed   By: Jerilynn Mages.  Shick M.D.   On: 12/08/2015 12:33   Dg C-arm 1-60 Min  12/09/2015  CLINICAL DATA:  55 year old male undergoing ORIF for pathologic fracture of the left femoral midshaft. Right lower lobe lung mass. Initial encounter. EXAM: LEFT FEMUR 2 VIEWS; DG C-ARM 61-120 MIN COMPARISON:  Preoperative left femur series 5617. Chest CT 12/06/2015. FLUOROSCOPY TIME:  2 minutes 30 seconds FINDINGS: Six intraoperative fluoroscopic images of the left femur a demonstrating intra medullary rod placement with proximal interlocking cannulated and  distal interlocking cortical screws. Near anatomic alignment now about the pathologic transverse comminuted midshaft fracture. IMPRESSION: ORIF of the left femur midshaft pathologic fracture with no adverse features. Electronically Signed   By: Genevie Ann M.D.   On: 12/09/2015 15:55   Dg Femur Min 2 Views Left  12/09/2015  CLINICAL DATA:  55 year old male undergoing ORIF for pathologic fracture of the left femoral midshaft. Right lower lobe lung mass. Initial encounter. EXAM: LEFT FEMUR 2 VIEWS; DG C-ARM 61-120 MIN COMPARISON:  Preoperative left femur series 5617. Chest CT 12/06/2015. FLUOROSCOPY TIME:  2 minutes 30 seconds FINDINGS: Six intraoperative fluoroscopic images of the left femur a demonstrating intra medullary rod placement with proximal interlocking cannulated and distal interlocking cortical screws. Near anatomic alignment now about the pathologic transverse comminuted midshaft fracture. IMPRESSION: ORIF of the left femur midshaft pathologic fracture with no adverse features. Electronically Signed   By: Genevie Ann M.D.   On: 12/09/2015 15:55   Dg Femur Min 2 Views Left  12/06/2015  CLINICAL DATA:   Patient twisted leg with mid femur pain. Initial encounter. EXAM: LEFT FEMUR 2 VIEWS COMPARISON:  None. FINDINGS: There is a transverse displaced fracture through the mid femoral diaphysis. There is an irregular lytic lesion involving the mid femoral diaphysis at the fracture site with overlying periosteal reaction. Overlying soft tissue swelling. IMPRESSION: Displaced transverse fracture through the mid femoral diaphysis. Irregular lytic lesion and periosteal reaction involving the mid femoral diaphysis at the fracture site is concerning for the possibility of metastatic disease or multiple myeloma. Electronically Signed   By: Lovey Newcomer M.D.   On: 12/06/2015 16:17    2D Echo:   Cardiac Cath:   Admission HPI:  Pt is a 55 yo M with no significant past medical history presenting to the hospital with pain in his left lower extremity. Patient states he was at work and twisted his left leg while trying to turn while walking down a step. States he heard his "bone pop" when this happened. Denies having any history of fractures in the past. Reports having a torn meniscus in his left knee for the past 2-3 months and is supposed to be getting surgery done sometime later this month. He denies having any recent fevers, change in appetite, cough, or hemoptysis. Reports having night sweats for the past few weeks and 7 pound weight loss (it is not clear from the history since when patient has been losing weight). Also reports having swelling in his testicle for the past 1 month. He also noticed a swelling on his left chest area a few weeks ago.   Hospital Course by problem list: Active Problems:   Femur fracture ([place])   Femur fracture, left (HCC)   Closed fracture of shaft of left femur (HCC)   Swelling of right testicle   Fracture   Mass of soft tissue   Pain   Effusion of knee   1.  Metastatic cancer: Pt who was previously healthy came in with a pathologic fracture after a fall. He was having unspecified  weight loss, and some night sweats for about 3 months. In the ER< a CT of the chest showed 2.5cm by 3 cm mass in the right lower lung, and also bilateral 2 cm adrenal masses. Oncology was consulted who came and saw the pt and recommended a brain MRI which showed a 4x2x2 cm mass adjacent to the left lateral pterygoid muscle, suspicious for metastatic spread.  He underwent a CT guided biopsy of the  soft tissue mass on the back which showed poorly differentiated adenocarcinoma with unknown primary, most likely lung. Further staining and genetic tests are still pending. Patient has been set up with the [facility] with Dr Julien Nordmann. A bone scan was also performed which was unremarkable.   Pathologic fracture of the left femur: He underwent an ORIF and IM nailing of the left femur, and postoperatively he recovered well. His pain was initially controlled with a PCA pump, and then with oxycodone PRN, scheduled tylenol, toradol. A long acting oxycontin was also added twice a day.  His hemoglobin was stable. His pain was well controlled on the day of discharge. He was evaluated by PM&R who recommended transfer to IP rehab on Monday. He worked with physical therapy and OT. PM&R wanted an xray of the left knee, which we obtained and which showed post-surgical changes, but no fracture. He is cleared by Orthopaedics for transfer to rehab.   Macrocytosis without anemia: hemoglobin was stable throughout.   Patient will be transferred to inpatient rehab.   Discharge Vitals:   BP 105/54 mmHg  Pulse 79  Temp(Src) 98.8 F (37.1 C) (Oral)  Resp 16  Ht 6' 6"  (1.981 m)  Wt 161 lb 6.4 oz (73.211 kg)  BMI 18.66 kg/m2  SpO2 98%  Discharge Labs:  No results found for this or any previous visit (from the past 24 hour(s)).  Signed: Burgess Estelle, MD 12/16/2015, 11:02 AM    Services Ordered on Discharge:  Equipment Ordered on Discharge:

## 2015-12-16 NOTE — Progress Notes (Signed)
Rehab admissions - I have approval for acute inpatient rehab admission from [facility] case manager.  Bed available today and will admit to acute inpatient rehab today.  Call me for questions.  #[phone]

## 2015-12-16 NOTE — Progress Notes (Signed)
Subjective: Mr. Fish had no acute events overnight. He continues to report pain but has not been using his PRN pain meds as consistently. Mr. Jarnigan has been increasing his movements and is able to walk further distances.   Objective: Vital signs in last 24 hours: Filed Vitals:   12/14/15 1429 12/14/15 2135 12/15/15 0558 12/15/15 2058  BP: 117/61 108/56 109/54 105/54  Pulse: 80 53 71 79  Temp: 99.6 F (37.6 C) 99.1 F (37.3 C) 98.9 F (37.2 C) 98.8 F (37.1 C)  TempSrc: Oral Oral Oral Oral  Resp: '18 16 16 16  '$ Height:      Weight:      SpO2: 99% 98% 99% 98%   Weight change:   Intake/Output Summary (Last 24 hours) at 12/16/15 1040 Last data filed at 12/15/15 1700  Gross per 24 hour  Intake    240 ml  Output    350 ml  Net   -110 ml   BP 105/54 mmHg  Pulse 79  Temp(Src) 98.8 F (37.1 C) (Oral)  Resp 16  Ht '6\' 6"'$  (1.981 m)  Wt 73.211 kg (161 lb 6.4 oz)  BMI 18.66 kg/m2  SpO2 98% General appearance: alert and cooperative  Extremities: bandage at left femur site, no swelling or warmth in knee   Lab Results: CBC    Component Value Date/Time   WBC 6.5 12/10/2015 0306   RBC 2.90* 12/10/2015 0306   RBC 3.45* 12/07/2015 0319   HGB 9.4* 12/10/2015 0306   HCT 28.8* 12/10/2015 0306   PLT 316 12/10/2015 0306   MCV 99.3 12/10/2015 0306   MCH 32.4 12/10/2015 0306   MCHC 32.6 12/10/2015 0306   RDW 13.0 12/10/2015 0306   BMP Latest Ref Rng 12/10/2015 12/09/2015 12/08/2015  Glucose 65 - 99 mg/dL 127(H) 97 94  BUN 6 - 20 mg/dL '11 16 15  '$ Creatinine 0.61 - 1.24 mg/dL 0.66 0.80 0.67  Sodium 135 - 145 mmol/L 133(L) 133(L) 134(L)  Potassium 3.5 - 5.1 mmol/L 3.9 4.0 3.9  Chloride 101 - 111 mmol/L 97(L) 96(L) 100(L)  CO2 22 - 32 mmol/L '26 26 25  '$ Calcium 8.9 - 10.3 mg/dL 8.1(L) 8.2(L) 8.2(L)   Micro Results: Recent Results (from the past 240 hour(s))  Surgical pcr screen     Status: None   Collection Time: 12/08/15  9:45 PM  Result Value Ref Range Status   MRSA, PCR  NEGATIVE NEGATIVE Final   Staphylococcus aureus NEGATIVE NEGATIVE Final    Comment:        The Xpert SA Assay (FDA approved for NASAL specimens in patients over 72 years of age), is one component of a comprehensive surveillance program.  Test performance has been validated by [facility] for patients greater than or equal to 16 year old. It is not intended to diagnose infection nor to guide or monitor treatment.    Studies/Results: No results found. Medications: I have reviewed the patient's current medications. Scheduled Meds: . acetaminophen  1,000 mg Oral Q6H  . bisacodyl  10 mg Rectal Daily  . docusate sodium  100 mg Oral BID  . ketorolac  15 mg Intravenous Q6H  . oxyCODONE  40 mg Oral Q12H  . polyethylene glycol  17 g Oral Daily  . senna-docusate  1 tablet Oral QHS   Continuous Infusions:  PRN Meds:.alum & mag hydroxide-simeth, HYDROmorphone (DILAUDID) injection, menthol-cetylpyridinium **OR** phenol, methocarbamol **OR** [DISCONTINUED] methocarbamol (ROBAXIN)  IV, ondansetron **OR** ondansetron (ZOFRAN) IV, oxyCODONE   Assessment/Plan: Mr. Holness is a  55 yo previously healthy man who presented to the ED with a pathological fracture of his left femur who was found to have multiple tumors involving the left femur, bilateral adrenals and left axillary chest wall.   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 Xray showed transverse displacement to the mid femoral diaphysis with irregular lytic lesions and periosteal reaction involving the mid femoral diaphysis at the fracture site. This is concerning for either a primary bone cancer or metastatic disease. CT of the femur showed a pathologic fracture at the mid femoral diaphysis with permeative bone changes in the cortex. Additionally, there was abnormal soft tissue within the medullary space  that extended into the fracture site. This is concerning for an osteosarcoma or metastatic disease. CT of chest, abdomen and pelvis revealed a right lower lung mass, possibly tumor. There were also bilateral adrenal masses found. There was an additional suprarenal mass found. On exam, a large, firm, nontender, fixated mass was found on the left chest wall near the axillary line that has been present for about 3 months.  -Insurance approved for inpatient rehab; plan to discharge today -Appreciate oncology recs  -Outpatient appointment with cancer clinic has been scheduled  This is a Careers information officer Note.  The care of the patient was discussed with Dr. Tiburcio Pea and the assessment and plan formulated with their assistance.  Please see their attached note for official documentation of the daily encounter.   LOS: 10 days   Wandra Mannan, Med Student 12/16/2015, 10:40 AM

## 2015-12-16 NOTE — Plan of Care (Signed)
Problem: RH PAIN MANAGEMENT Goal: RH STG PAIN MANAGED AT OR BELOW PT'S PAIN GOAL <4  Outcome: Not Progressing Patient rates his pain 8-10/10 during my shift

## 2015-12-16 NOTE — H&P (View-Only) (Signed)
Physical Medicine and Rehabilitation Admission H&P    Chief Complaint  Patient presents with  . Leg Pain  : HPI: Marvin Hardy is a 55 y.o. right handed male with history of tobacco abuse. Presented 12/06/2015 when he felt a pop in his left leg while twisting while at work. Denied any fall. Patient had been living with a friend and was independent prior to admission. He currently is unsure of his discharge plan he has no local family. X-rays and imaging revealed left displaced transverse fractures of the mid femoral diaphysis as well as irregular lytic lesion and periosteal reaction involving the mid femoral diaphysis at the fracture site concerning for possibility of metastatic disease or multiple myeloma. CT abdomen and pelvis as well as CT of the chest showed a 2.5 x 3.6 right lower lobe lung mass suspicious for neoplasm.Bilateral adrenal masses. 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  Lytic lesions of the femoral neck and intertrochanteric region But no evidence of skeletal metastasis elsewhere. 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 Follow-up oncology service of Dr. Earlie Server and Plan follow-up outpatient with plan of care. Underwent intramedullary nail placement left femur 12/09/2015 per Dr. Ninfa Linden. Hospital course pain management. Acute blood loss anemia 9.4 and monitored. Weightbearing as tolerated. Physical therapy evaluation completed with recommendations of physical medicine rehabilitation consult.Patient was admitted for a comprehensive rehabilitation program  ROS Constitutional: Positive for weight loss. Negative for fever and chills.  HENT: Negative for hearing loss.  Eyes: Negative for blurred vision and double vision.  Respiratory: Positive for cough.  Cardiovascular: Negative for chest pain,  palpitations and leg swelling.  Gastrointestinal: Positive for constipation. Negative for vomiting.  Genitourinary: Positive for urgency. Negative for dysuria.  Musculoskeletal: Positive for myalgias and joint pain.  Skin: Negative for rash.  Neurological: Positive for seizures. Negative for headaches.  All other systems reviewed and are negative  History reviewed. No pertinent past medical history. Past Surgical History  Procedure Laterality Date  . Femur im nail Left 12/09/2015    Procedure: INTRAMEDULLARY (IM) NAIL LEFT FEMUR;  Surgeon: Mcarthur Rossetti, MD;  Location: [facility];  Service: Orthopedics;  Laterality: Left;   Family History  Problem Relation Age of Onset  . Hypertension Mother   . Heart disease Mother   . Diabetes Mother   . Lung cancer Father    Social History:  reports that he has been smoking Cigars.  He does not have any smokeless tobacco history on file. He reports that he drinks alcohol. He reports that he does not use illicit drugs. Allergies:  Allergies  Allergen Reactions  . Aleve [Naproxen Sodium] Itching        Medications Prior to Admission  Medication Sig Dispense Refill  . acetaminophen (TYLENOL) 500 MG tablet Take 1 tablet (500 mg total) by mouth every 6 (six) hours as needed. (Patient taking differently: Take 500 mg by mouth every 6 (six) hours as needed for moderate pain. ) 30 tablet 0  . Aspirin-Salicylamide-Caffeine (BC FAST PAIN RELIEF) 650-195-33.3 MG PACK Take 1 Package by mouth daily as needed (for pain).    Marland Kitchen ibuprofen (ADVIL,MOTRIN) 800 MG tablet Take 800 mg by mouth every 4 (four) hours as needed for moderate pain.    . naproxen (NAPROSYN) 500 MG tablet Take 1 tablet (500 mg total) by mouth 2 (two) times daily.  30 tablet 0    Home: Home Living Family/patient expects to be discharged to:: Unsure Living Arrangements: Non-relatives/Friends Available Help at Discharge: Family, Available PRN/intermittently Type of Home: House Home  Access:  (Unsure) Home Equipment: Kasandra Knudsen - single point Additional Comments: Pt states "essentailly I'm homeless". Pt states he lives with a friend who has moved and he doesn't know where he will go after D/C.   Functional History: Prior Function Level of Independence: Independent with assistive device(s) Comments: Used the cane all the time.   Functional Status:  Mobility: Bed Mobility Overal bed mobility: Needs Assistance Bed Mobility: Supine to Sit Supine to sit: Min guard General bed mobility comments: HOB up and use of rail Transfers Overall transfer level: Needs assistance Equipment used: Rolling walker (2 wheeled) Transfers: Sit to/from Stand Sit to Stand: Min assist Stand pivot transfers: Min assist, +2 physical assistance General transfer comment: 50% VC's on proper hand placem,ent and increased time to rise and sit due to "tightness"  and pain level Ambulation/Gait Ambulation/Gait assistance: Min assist, +2 safety/equipment Ambulation Distance (Feet): 55 Feet Assistive device: Rolling walker (2 wheeled) General Gait Details: initially unable to advance L LE required 25% assist.  Increased ability with increased gait distance.  Max c/o swelling and pain esp during L LE advancement.   Gait velocity: decreased    ADL: ADL Overall ADL's : Needs assistance/impaired Eating/Feeding: Independent, Sitting Grooming: Min guard, Standing Upper Body Bathing: Set up, Sitting Lower Body Bathing: Minimal assistance (with min A sit<>stand) Upper Body Dressing : Set up, Sitting Lower Body Dressing: Minimal assistance, With adaptive equipment (min A sit<>stand) Toilet Transfer: Minimal assistance, Ambulation (Bed>out door and down hallway> sit in recliner behind him) General ADL Comments: Eval limited to bed level due to pain. Pt very flat during session. States he did'nt know what he was going to do. limited mobility in bed due to pain. Attempted to reposition LLE unsuccessfully. Pt  declined getting out of bed this pm.   Cognition: Cognition Overall Cognitive Status: Within Functional Limits for tasks assessed Orientation Level: Oriented X4 Cognition Arousal/Alertness: Awake/alert Behavior During Therapy: WFL for tasks assessed/performed Overall Cognitive Status: Within Functional Limits for tasks assessed  Physical Exam: Blood pressure 109/54, pulse 71, temperature 98.9 F (37.2 C), temperature source Oral, resp. rate 16, height 6' 6" (1.981 m), weight 73.211 kg (161 lb 6.4 oz), SpO2 99 %. Physical Exam Constitutional: He is oriented to person, place, and time. He appears well-developed.  HENT: oral mucosa moist. Head: Normocephalic.  Eyes: EOM are normal.  Neck: Normal range of motion. Neck supple. No thyromegaly present.  Cardiovascular: Normal rate and regular rhythm. no murmurs rubs,  Respiratory:  Decreased breath sounds at the bases but clear to auscultation. No distress GI: Soft. Bowel sounds are normal. He exhibits no distension.  Neurological: He is alert and oriented to person, place, and time. Sensory normal. UE: 4/5 prox to 5/5 distal. RLE: 4/5 to 5/5 distally. LLE: (pain) 2/5 to 4/5 distally. DTR's 1+, CN exam normal Musc: pain in left hip with PROM of leg. Left chest wall discomfort also with shoulder ROM. Skin:  Proximal and distal incisions with staples/intact, mild serosanginous drainage.  Left knee effusion Left thigh is swollen and tender no ecchymosis       Medical Problem List and Plan: 1.  Limited functional mobility secondary to pathologic left femur fracture/poorly differentiated adenocarcinoma. Status post intramedullary nail placement 12/09/2015. Weightbearing as tolerated 2.  DVT Prophylaxis/Anticoagulation: SCDs. Check vascular study 3. Pain Management:  OxyContin sustained release 40 mg every 12 hours, oxycodone and Robaxin as needed. Monitor with increased mobility---may need further titration. Still having pain at rest. 4.  Mood: team to provide ego support. Invited the patient to be open with team regarding any need for support, counseling, etc 5. Neuropsych: This patient is capable of making decisions on his own behalf. 6. Skin/Wound Care: Routine skin checks 7. Fluids/Electrolytes/Nutrition: Routine I&O's with follow-up chemistries 8. Acute blood loss anemia. Follow-up CBC upon admission 9. Constipation. Laxative assistance 10. Tobacco abuse. Counseling    Post Admission Physician Evaluation: 1. Functional deficits secondary  to pathological left femur fracture. 2. Patient is admitted to receive collaborative, interdisciplinary care between the physiatrist, rehab nursing staff, and therapy team. 3. Patient's level of medical complexity and substantial therapy needs in context of that medical necessity cannot be provided at a lesser intensity of care such as a SNF. 4. Patient has experienced substantial functional loss from his/her baseline which was documented above under the "Functional History" and "Functional Status" headings.  Judging by the patient's diagnosis, physical exam, and functional history, the patient has potential for functional progress which will result in measurable gains while on inpatient rehab.  These gains will be of substantial and practical use upon discharge  in facilitating mobility and self-care at the household level.  5. Physiatrist will provide 24 hour management of medical needs as well as oversight of the therapy plan/treatment and provide guidance as appropriate regarding the interaction of the two. 6. 24 hour rehab nursing will assist with bladder management, bowel management, safety, skin/wound care, disease management, medication administration, pain management and patient education  and help integrate therapy concepts, techniques,education, etc. 7. PT will assess and treat for/with: Lower extremity strength, range of motion, stamina, balance, functional mobility, safety,  adaptive techniques and equipment, pain mgt, ortho precautions, activity tolerance.   Goals are: mod I to supervision. 8. OT will assess and treat for/with: ADL's, functional mobility, safety, upper extremity strength, adaptive techniques and equipment, pain mgt, ortho precautions, ego support, community reintegration.   Goals are: mod I to supervision. Therapy may proceed with showering this patient. 9. SLP will assess and treat for/with: n/a3.  Goals are: n/a. 10. Case Management and Social Worker will assess and treat for psychological issues and discharge planning. 11. Team conference will be held weekly to assess progress toward goals and to determine barriers to discharge. 12. Patient will receive at least 3 hours of therapy per day at least 5 days per week. 13. ELOS: 11-15 days       14. Prognosis:  excellent     Meredith Staggers, MD, [facility] 12/16/2015

## 2015-12-17 ENCOUNTER — Inpatient Hospital Stay (HOSPITAL_COMMUNITY): Payer: BLUE CROSS/BLUE SHIELD | Admitting: Occupational Therapy

## 2015-12-17 ENCOUNTER — Inpatient Hospital Stay (HOSPITAL_COMMUNITY): Payer: BLUE CROSS/BLUE SHIELD | Admitting: Physical Therapy

## 2015-12-17 ENCOUNTER — Encounter: Payer: Self-pay | Admitting: *Deleted

## 2015-12-17 ENCOUNTER — Inpatient Hospital Stay (HOSPITAL_COMMUNITY): Payer: BLUE CROSS/BLUE SHIELD

## 2015-12-17 DIAGNOSIS — R609 Edema, unspecified: Secondary | ICD-10-CM

## 2015-12-17 DIAGNOSIS — E46 Unspecified protein-calorie malnutrition: Secondary | ICD-10-CM | POA: Insufficient documentation

## 2015-12-17 DIAGNOSIS — D62 Acute posthemorrhagic anemia: Secondary | ICD-10-CM | POA: Insufficient documentation

## 2015-12-17 DIAGNOSIS — E871 Hypo-osmolality and hyponatremia: Secondary | ICD-10-CM | POA: Insufficient documentation

## 2015-12-17 DIAGNOSIS — E8809 Other disorders of plasma-protein metabolism, not elsewhere classified: Secondary | ICD-10-CM | POA: Insufficient documentation

## 2015-12-17 MED ORDER — BENEPROTEIN PO POWD
1.0000 | Freq: Three times a day (TID) | ORAL | Status: DC
  Administered 2015-12-17 – 2015-12-26 (×11): 6 g via ORAL
  Filled 2015-12-17 (×2): qty 227

## 2015-12-17 NOTE — Progress Notes (Signed)
Marvin Diones, RN Rehab Admission Coordinator Signed Physical Medicine and Rehabilitation PMR Pre-admission 12/16/2015 12:18 PM  Related encounter: ED to Hosp-Admission (Discharged) from 12/06/2015 in [place] Collapse All   PMR Admission Coordinator Pre-Admission Assessment  Patient: Marvin Hardy is an 55 y.o., male MRN: [id number] DOB: February 12, 1961 Height: _0  (198.1 cm) Weight: 73.211 kg (161 lb 6.4 oz)  Insurance Information HMO: PPO: PCP: IPA: 80/20: OTHER: POS plan PRIMARY: [place] [place] Policy#: [id number] Subscriber: Marvin Hardy CM Name: Marvin Hardy Phone#: [phone] Fax#: [phone] Pre-Cert#: [id number] X 7 days with update due 12/23/15 Employer: Worked 6 days a week Benefits: Phone #: [phone] Name: Marvin Hardy. Date: 08/02/13 Deduct: $2500 (met $2102.39) Out of Pocket Max: $6000 (met $2102.39) Life Max: unlimited CIR: 75% SNF: 75% with 120 days max Outpatient: 75% with 40 visits max Co-Pay: 25% Home Health: 75% Co-Pay: 25% DME: 75% Co-Pay: 25% Providers: in network  Medicaid Application Date: Case Manager:  Disability Application Date: Case Worker:   Emergency Contact Information Contact Information    Name Relation Home Work [place] Sister   [place] Daughter   [place] Niece   [phone]   Marvin Hardy [phone]       Current Medical History  Patient Admitting Diagnosis: Pathologic Left femur fracture, metastatic cancer  History of Present Illness: A 55 y.o. right handed male with history of tobacco abuse. Presented 12/06/2015 when he felt a pop in his  left leg while twisting while at work. Denied any fall. Patient had been living with a friend and was independent prior to admission. He currently is unsure of his discharge plan as he has no local family. He may opt to go back to [place] and stay with his sister. X-rays and imaging revealed left displaced transverse fractures of the mid femoral diaphysis as well as irregular lytic lesion and periosteal reaction involving the mid femoral diaphysis at the fracture site concerning for possibility of metastatic disease or multiple myeloma. CT abdomen and pelvis as well as CT of the chest showed a 2.5 x 3.6 right lower lobe lung mass suspicious for neoplasm.Bilateral adrenal masses. 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 Lytic lesions of the femoral neck and intertrochanteric region But no evidence of skeletal metastasis elsewhere. 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 Follow-up oncology service of Dr. Earlie Server and Plan follow-up outpatient with plan of care. Underwent intramedullary nail placement left femur 12/09/2015 per Dr. Ninfa Linden. Hospital course pain management. Acute blood loss anemia 9.4 and monitored. Weightbearing as tolerated. Physical therapy evaluation completed with recommendations of physical medicine rehabilitation consult. Patient to be admitted for a comprehensive inpatient rehabilitation program.   Past Medical History  History reviewed. No pertinent past medical history.  Family History  family history includes Diabetes in his mother; Heart disease in his mother; Hypertension in his mother; Lung cancer in his father.  Prior Rehab/Hospitalizations: No previous rehab.   Has the patient had major surgery during 100 days prior to admission? No  Current Medications   Current facility-administered medications:   . acetaminophen (TYLENOL) tablet 1,000 mg, 1,000 mg, Oral, Q6H, Milagros Loll, MD, 1,000 mg at 12/15/15 2331 . alum & mag hydroxide-simeth (MAALOX/MYLANTA) 200-200-20 MG/5ML suspension 30 mL, 30 mL, Oral, Q4H PRN, Harrell Gave  Kerry Fort, MD . bisacodyl (DULCOLAX) suppository 10 mg, 10 mg, Rectal, Daily, Burgess Estelle, MD, 10 mg at 12/15/15 1858 . docusate sodium (COLACE) capsule 100 mg, 100 mg, Oral, BID, Mcarthur Rossetti, MD, 100 mg at 12/16/15 0944 . HYDROmorphone (DILAUDID) injection 2-4 mg, 2-4 mg, Intravenous, Q4H PRN, Milagros Loll, MD . ketorolac (TORADOL) 15 MG/ML injection 15 mg, 15 mg, Intravenous, Q6H, Milagros Loll, MD, 15 mg at 12/16/15 0535 . menthol-cetylpyridinium (CEPACOL) lozenge 3 mg, 1 lozenge, Oral, PRN **OR** phenol (CHLORASEPTIC) mouth spray 1 spray, 1 spray, Mouth/Throat, PRN, Mcarthur Rossetti, MD . methocarbamol (ROBAXIN) tablet 500 mg, 500 mg, Oral, Q6H PRN, 500 mg at 12/16/15 1038 **OR** [DISCONTINUED] methocarbamol (ROBAXIN) 500 mg in dextrose 5 % 50 mL IVPB, 500 mg, Intravenous, Q6H PRN, Mcarthur Rossetti, MD, 500 mg at 12/09/15 1716 . ondansetron (ZOFRAN) tablet 4 mg, 4 mg, Oral, Q6H PRN **OR** ondansetron (ZOFRAN) injection 4 mg, 4 mg, Intravenous, Q6H PRN, Mcarthur Rossetti, MD . oxyCODONE (Oxy IR/ROXICODONE) immediate release tablet 10-15 mg, 10-15 mg, Oral, Q4H PRN, Milagros Loll, MD, 15 mg at 12/16/15 1039 . oxyCODONE (OXYCONTIN) 12 hr tablet 40 mg, 40 mg, Oral, Q12H, Milagros Loll, MD, 40 mg at 12/16/15 0944 . polyethylene glycol (MIRALAX / GLYCOLAX) packet 17 g, 17 g, Oral, Daily, Milagros Loll, MD, 17 g at 12/16/15 0946 . senna-docusate (Senokot-S) tablet 1 tablet, 1 tablet, Oral, QHS, Milagros Loll, MD, 1 tablet at 12/15/15 2330  Patients Current Diet: Diet regular Room service appropriate?: Yes; Fluid consistency:: Thin Diet - low sodium heart healthy  Precautions /  Restrictions Precautions Precautions: Fall Restrictions Weight Bearing Restrictions: No LLE Weight Bearing: Weight bearing as tolerated   Has the patient had 2 or more falls or a fall with injury in the past year?No  Prior Activity Level Went out daily. Worked 6 days a week.  Home Assistive Devices / Equipment Home Assistive Devices/Equipment: None Home Equipment: Cane - single point  Prior Device Use: Indicate devices/aids used by the patient prior to current illness, exacerbation or injury? Cane. He states that he has been using the cane for the last 2-3 months due to pain in left leg.  Prior Functional Level Prior Function Level of Independence: Independent with assistive device(s) Comments: Used the cane all the time.   Self Care: Did the patient need help bathing, dressing, using the toilet or eating? Independent  Indoor Mobility: Did the patient need assistance with walking from room to room (with or without device)? Independent  Stairs: Did the patient need assistance with internal or external stairs (with or without device)? Independent  Functional Cognition: Did the patient need help planning regular tasks such as shopping or remembering to take medications? Independent  Current Functional Level Cognition  Overall Cognitive Status: Within Functional Limits for tasks assessed Orientation Level: Oriented X4   Extremity Assessment (includes Sensation/Coordination)  Upper Extremity Assessment: Generalized weakness  Lower Extremity Assessment: Defer to PT evaluation LLE Deficits / Details: Pt declined to move LLE    ADLs  Overall ADL's : Needs assistance/impaired Eating/Feeding: Independent, Sitting Grooming: Min guard, Standing Upper Body Bathing: Set up, Sitting Lower Body Bathing: Minimal assistance (with min A sit<>stand) Upper Body Dressing : Set up, Sitting Lower Body Dressing: Minimal assistance, With adaptive equipment (min A  sit<>stand) Toilet Transfer: Minimal assistance, Ambulation (Bed>out door and down hallway> sit in recliner behind him) General ADL Comments: Eval limited to bed level due to pain. Pt very  flat during session. States he did'nt know what he was going to do. limited mobility in bed due to pain. Attempted to reposition LLE unsuccessfully. Pt declined getting out of bed this pm.     Mobility  Overal bed mobility: Needs Assistance Bed Mobility: Supine to Sit Supine to sit: Min guard General bed mobility comments: HOB up and use of rail    Transfers  Overall transfer level: Needs assistance Equipment used: Rolling walker (2 wheeled) Transfers: Sit to/from Stand Sit to Stand: Min guard Stand pivot transfers: Min assist, +2 physical assistance General transfer comment: Pt remains to push through RW vs. pushing from seated surface. Pt required cues for advancement of LLE.     Ambulation / Gait / Stairs / Wheelchair Mobility  Ambulation/Gait Ambulation/Gait assistance: Min guard, Supervision Ambulation Distance (Feet): 100 Feet Assistive device: Rolling walker (2 wheeled) Gait Pattern/deviations: Step-through pattern, Decreased stride length, Wide base of support, Decreased stance time - left General Gait Details: Pt required cues for sequencing, RW position and forward gaze. Pt able to advance gait distance and required cues for increased L knee flexion in swing phase and L heel strike.  Gait velocity: decreased    Posture / Balance Dynamic Sitting Balance Sitting balance - Comments: Due to pain Balance Overall balance assessment: Needs assistance Sitting-balance support: Feet supported, No upper extremity supported Sitting balance-Leahy Scale: Good Sitting balance - Comments: Due to pain Postural control: Posterior lean, Right lateral lean Standing balance support: Bilateral upper extremity supported, During functional activity Standing balance-Leahy Scale:  Fair Standing balance comment: reliant on RW    Special needs/care consideration BiPAP/CPAP No CPM No Continuous Drip IV No Dialysis No  Life Vest No Oxygen No Special Bed No Trach Size No Wound Vac (area) No  Skin Has surgical incision left hip/knee and biopsy site left chest area  Bowel mgmt: Had BM X 4 on 12/15/15 per patient Bladder mgmt: Voiding in urinal Diabetic mgmt: No    Previous Home Environment Living Arrangements: Non-relatives/Friends Available Help at Discharge: Family, Available PRN/intermittently Type of Home: House Home Access: (Unsure) Home Care Services: No Additional Comments: Pt states "essentailly I'm homeless". Pt states he lives with a friend who has moved and he doesn't know where he will go after D/C.  Discharge Living Setting Plans for Discharge Living Setting: Other (Comment) (May go home to [place] with sister, Lavella Hammock.) Does the patient have any problems obtaining your medications?: No  Social/Family/Support Systems Patient Roles: Spouse, Parent, Other (Comment) (Separated from wife, has 2 children, sister, niece, and a brother in [place]. Contact Information: Janith [place] - sister [phone] Anticipated Caregiver: self Ability/Limitations of Caregiver: May go home with sister in [place] Caregiver Availability: Intermittent Discharge Plan Discussed with Primary Caregiver: No (Discussed plan with patient.) Is Caregiver In Agreement with Plan?: Yes (Not sure of caregiver support yet.) Does Caregiver/Family have Issues with Lodging/Transportation while Pt is in Rehab?: No (Family are in [place], brother in [place].)  Goals/Additional Needs Patient/Family Goal for Rehab: PT/OT mod I goals Expected length of stay: 7-10 days Cultural Considerations: [employer] Dietary Needs: Regular diet, thin liquids Equipment Needs: TBD Additional Information: Separated from wife for 3 years. Moved to  to find work.  Has been in [place] for about 1 1/2 years. Stayed with friends, but essentially homeless. Has no place to stay here in [place]. Pt/Family Agrees to Admission and willing to participate: Yes (Patient agrees to admission) Program Orientation Provided & Reviewed with Pt/Caregiver Including Roles & Responsibilities: Yes (Reviewed with  patient.)  Decrease burden of Care through IP rehab admission: N/A  Possible need for SNF placement upon discharge: Not planned  Patient Condition: This patient's medical and functional status has changed since the consult dated: 12/11/15 in which the Rehabilitation Physician determined and documented that the patient's condition is appropriate for intensive rehabilitative care in an inpatient rehabilitation facility. See "History of Present Illness" (above) for medical update. Functional changes are: Currently requiring minguard assist to ambulate 100 feet RW. Patient's medical and functional status update has been discussed with the Rehabilitation physician and patient remains appropriate for inpatient rehabilitation. Will admit to inpatient rehab today.  Preadmission Screen Completed By: Marvin Hardy, 12/16/2015 12:33 PM ______________________________________________________________________  Discussed status with Dr. Naaman Plummer on 12/16/15 at 1233 and received telephone approval for admission today.  Admission Coordinator: Marvin Hardy, time1233/Date05/16/17          Cosigned by: Meredith Staggers, MD at 12/16/2015 12:55 PM  Revision History     Date/Time User Provider Type Action   12/16/2015 12:55 PM Meredith Staggers, MD Physician Cosign   12/16/2015 12:33 PM Marvin Diones, RN Rehab Admission Coordinator Sign

## 2015-12-17 NOTE — Progress Notes (Signed)
[name], MD Physician Signed Physical Medicine and Rehabilitation Consult Note 12/11/2015 2:50 PM  Related encounter: ED to Hosp-Admission (Discharged) from 12/06/2015 in [place] Collapse All        Physical Medicine and Rehabilitation Consult Reason for Consult: Pathologic left femur fracture in the stating of extensive metastatic disease/undifferentiated adenocarcinoma Referring Physician: Family medicine   HPI: Marvin Hardy is a 55 y.o. right handed male with history of tobacco abuse. Presented 12/06/2015 when he felt a pop in his left leg while twisting while at work. Denied any fall. Patient currently lives with a friend and was independent prior to admission. He currently is unsure of his discharge plan he has no local family. X-rays and imaging revealed left displaced transverse fractures of the mid femoral diaphysis as well as irregular lytic lesion and periosteal reaction involving the mid femoral diaphysis at the fracture site concerning for possibility of metastatic disease or multiple myeloma. CT abdomen and pelvis as well as CT of the chest showed a 2.5 x 3.6 right lower lobe lung mass suspicious for neoplasm. Bilateral adrenal masses. Lytic lesions of the femoral neck and intertrochanteric region. 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 Follow-up oncology service of Dr. Earlie Server and await further plan of care. Underwent intramedullary nail placement left femur 12/09/2015 per Dr. Ninfa Linden. Hospital course pain management. Acute blood loss anemia 9.4 and monitored. Weightbearing as tolerated. Physical therapy evaluation completed with recommendations of physical medicine rehabilitation consult.   Review of Systems  Constitutional: Positive for weight loss. Negative for fever and chills.  HENT: Negative for hearing loss.  Eyes: Negative for blurred vision and  double vision.  Respiratory: Positive for cough.  Cardiovascular: Negative for chest pain, palpitations and leg swelling.  Gastrointestinal: Positive for constipation. Negative for vomiting.  Genitourinary: Positive for urgency. Negative for dysuria.  Musculoskeletal: Positive for myalgias and joint pain.  Skin: Negative for rash.  Neurological: Positive for seizures. Negative for headaches.  All other systems reviewed and are negative.  History reviewed. No pertinent past medical history. Past Surgical History  Procedure Laterality Date  . Femur im nail Left 12/09/2015    Procedure: INTRAMEDULLARY (IM) NAIL LEFT FEMUR; Surgeon: Mcarthur Rossetti, MD; Location: [facility]; Service: Orthopedics; Laterality: Left;   Family History  Problem Relation Age of Onset  . Hypertension Mother   . Heart disease Mother   . Diabetes Mother   . Lung cancer Father    Social History:  reports that he has been smoking Cigars. He does not have any smokeless tobacco history on file. He reports that he drinks alcohol. He reports that he does not use illicit drugs. Allergies:  Allergies  Allergen Reactions  . Aleve [Naproxen Sodium] Itching        Medications Prior to Admission  Medication Sig Dispense Refill  . acetaminophen (TYLENOL) 500 MG tablet Take 1 tablet (500 mg total) by mouth every 6 (six) hours as needed. (Patient taking differently: Take 500 mg by mouth every 6 (six) hours as needed for moderate pain. ) 30 tablet 0  . Aspirin-Salicylamide-Caffeine (BC FAST PAIN RELIEF) 650-195-33.3 MG PACK Take 1 Package by mouth daily as needed (for pain).    Marland Kitchen ibuprofen (ADVIL,MOTRIN) 800 MG tablet Take 800 mg by mouth every 4 (four) hours as needed for moderate pain.    . naproxen (NAPROSYN) 500 MG tablet Take 1 tablet (500 mg total) by mouth 2 (  two) times daily. 30 tablet 0    Home: Home Living Family/patient expects to be  discharged to:: Private residence Living Arrangements: Non-relatives/Friends Available Help at Discharge: Family, Available PRN/intermittently Type of Home: House Home Access: (Unsure) Home Equipment: Kasandra Knudsen - single point Additional Comments: Pt reports his roommate has found a new place to live for them and he does know currently know any details of the living situation he will be going back to.   Functional History: Prior Function Level of Independence: Independent with assistive device(s) Comments: Used the cane all the time.  Functional Status:  Mobility: Bed Mobility Overal bed mobility: Needs Assistance Bed Mobility: Supine to Sit Supine to sit: Mod assist General bed mobility comments: Assist for LLE mobility. Pt requires increased time and many rest breaks to complete transition to EOB.  Transfers Overall transfer level: Needs assistance Equipment used: Rolling walker (2 wheeled) Transfers: Sit to/from Stand Sit to Stand: Mod assist, +2 physical assistance, From elevated surface General transfer comment: Increased time and +2 assist required to power-up to full standing position. Pt with increased pain which was main limiting factor.  Ambulation/Gait General Gait Details: Unable to take steps. Pt participated in minimal pre-gait activity while standing EOB.     ADL:    Cognition: Cognition Overall Cognitive Status: Within Functional Limits for tasks assessed Orientation Level: Oriented X4 Cognition Arousal/Alertness: Awake/alert Behavior During Therapy: WFL for tasks assessed/performed Overall Cognitive Status: Within Functional Limits for tasks assessed  Blood pressure 113/63, pulse 82, temperature 98.7 F (37.1 C), temperature source Oral, resp. rate 17, height _0  (1.981 m), weight 73.211 kg (161 lb 6.4 oz), SpO2 100 %. Physical Exam  Constitutional: He is oriented to person, place, and time. He appears well-developed.  HENT:  Head: Normocephalic.  Eyes:  EOM are normal.  Neck: Normal range of motion. Neck supple. No thyromegaly present.  Cardiovascular: Normal rate and regular rhythm.  Respiratory:  Decreased breath sounds at the bases but clear to auscultation  GI: Soft. Bowel sounds are normal. He exhibits no distension.  Neurological: He is alert and oriented to person, place, and time.  Skin:  Surgical site clean and dry  Left knee effusion Left thigh is swollen and tender no ecchymosis.   Lab Results Last 24 Hours    No results found for this or any previous visit (from the past 24 hour(s)).    Imaging Results (Last 48 hours)    Dg C-arm 1-60 Min  12/09/2015 CLINICAL DATA: 55 year old male undergoing ORIF for pathologic fracture of the left femoral midshaft. Right lower lobe lung mass. Initial encounter. EXAM: LEFT FEMUR 2 VIEWS; DG C-ARM 61-120 MIN COMPARISON: Preoperative left femur series 5617. Chest CT 12/06/2015. FLUOROSCOPY TIME: 2 minutes 30 seconds FINDINGS: Six intraoperative fluoroscopic images of the left femur a demonstrating intra medullary rod placement with proximal interlocking cannulated and distal interlocking cortical screws. Near anatomic alignment now about the pathologic transverse comminuted midshaft fracture. IMPRESSION: ORIF of the left femur midshaft pathologic fracture with no adverse features. Electronically Signed By: Genevie Ann M.D. On: 12/09/2015 15:55   Dg Femur Min 2 Views Left  12/09/2015 CLINICAL DATA: 55 year old male undergoing ORIF for pathologic fracture of the left femoral midshaft. Right lower lobe lung mass. Initial encounter. EXAM: LEFT FEMUR 2 VIEWS; DG C-ARM 61-120 MIN COMPARISON: Preoperative left femur series 5617. Chest CT 12/06/2015. FLUOROSCOPY TIME: 2 minutes 30 seconds FINDINGS: Six intraoperative fluoroscopic images of the left femur a demonstrating intra medullary rod placement with proximal interlocking  cannulated and distal interlocking cortical screws. Near anatomic alignment  now about the pathologic transverse comminuted midshaft fracture. IMPRESSION: ORIF of the left femur midshaft pathologic fracture with no adverse features. Electronically Signed By: Genevie Ann M.D. On: 12/09/2015 15:55     Assessment/Plan: Diagnosis: Left pathologic midshaft femur fracture status post IM nail 12/09/2015, weightbearing as tolerated 1. Does the need for close, 24 hr/day medical supervision in concert with the patient's rehab needs make it unreasonable for this patient to be served in a less intensive setting? Yes 2. Co-Morbidities requiring supervision/potential complications: Metastatic adenocarcinoma either lung or GI primary 3. Due to bladder management, bowel management, safety, skin/wound care, disease management, medication administration, pain management and patient education, does the patient require 24 hr/day rehab nursing? Yes 4. Does the patient require coordinated care of a physician, rehab nurse, PT (1-2 hrs/day, 5 days/week) and OT (2 hrs/day, 5 days/week) to address physical and functional deficits in the context of the above medical diagnosis(es)? Yes Addressing deficits in the following areas: balance, endurance, locomotion, strength, transferring, bowel/bladder control, bathing, dressing, feeding, grooming and toileting 5. Can the patient actively participate in an intensive therapy program of at least 3 hrs of therapy per day at least 5 days per week? Yes 6. The potential for patient to make measurable gains while on inpatient rehab is good 7. Anticipated functional outcomes upon discharge from inpatient rehab are modified independent and supervision with PT, modified independent and supervision with OT, n/a with SLP. 8. Estimated rehab length of stay to reach the above functional goals is: 7-10 days 9. Does the patient have adequate social supports and living environment to accommodate these discharge functional goals? Potentially 10. Anticipated D/C setting:  Home 11. Anticipated post D/C treatments: [facility] therapy 12. Overall Rehab/Functional Prognosis: fair  RECOMMENDATIONS: This patient's condition is appropriate for continued rehabilitative care in the following setting: CIR Patient has agreed to participate in recommended program. Potentially Note that insurance prior authorization may be required for reimbursement for recommended care.  Comment: Patient with left knee effusion as well as significant swelling left thigh, would like orthopedic input whether any further imaging is needed prior to rehab    12/11/2015       Revision History     Date/Time User Provider Type Action   12/11/2015 5:18 PM [name], MD Physician Sign   12/11/2015 3:06 PM Cathlyn Parsons, PA-C Physician Assistant Pend   View Details Report       Routing History     Date/Time From To Method   12/11/2015 5:18 PM [name], MD No Pcp Per Patient In Basket

## 2015-12-17 NOTE — Plan of Care (Signed)
Problem: RH PAIN MANAGEMENT Goal: RH STG PAIN MANAGED AT OR BELOW PT'S PAIN GOAL <4  Outcome: Not Progressing Rates pain a 9/10 a majority of the time

## 2015-12-17 NOTE — Progress Notes (Signed)
Physical Therapy Session Note  Patient Details  Name: Marvin Hardy MRN: [id number] Date of Birth: [birth year]  Today's Date: 12/17/2015 PT Individual Time: 9150-5697 PT Individual Time Calculation (min): 58 min   Skilled Therapeutic Interventions/Progress Updates:    Pt received in bed noting 10/10 L thigh pain; PT notified RN who administered pain medication. Pt transferred supine>sit with Min A for LLE and use of bed features. Pt required significantly extra time & rest breaks to complete movement to sitting EOB 2/2 pain.PT threaded pants on pt's BLE total A while pt supine in bed & able able to pull them up to thighs. Sitting at EOB pt reported need to use bathroom & used urinal in sitting; (+) void. Pt able to complete sit<>stand transfers and maintain standing with steady A to button pants. Pt noted significant fatigue & required significantly long rest break; PT educated pt on cancer related fatigue as well as ways to conserve energy. Pt transferred sitting EOB>w/c via squat pivot with steady A & use of bed rails & armrests. Pt with significant LLE pain with movement & requires pillow under it while supported on elevating foot rest. In gym pt able to ambulate over uneven surface x 10 ft with RW & Min A, with decreased weight bearing through LLE. Pt self propelled w/c with BUE back to room and cuing from therapist to produce pushes with BUE simultaneously, and to allow 1-2 rotations of wheel before next push to conserve energy. Pt stand pivot w/c>recliner with steady A & pt left in recliner with BLE elevated & all needs within reach.   Therapy Documentation Precautions:  Restrictions LLE Weight Bearing: Weight bearing as tolerated  Pain: Pain Assessment Pain Assessment: 0-10 Pain Score: 10-Worst pain ever Pain Location:  (thigh) Pain Orientation: Left Pain Intervention(s): RN made aware   See Function Navigator for Current Functional Status.   Therapy/Group: Individual  Therapy  Waunita Schooner 12/17/2015, 7:58 AM

## 2015-12-17 NOTE — Progress Notes (Signed)
Patient noticed that he has a knot on the back of his head. RN observed a marble sized hard lump at base of left skull. No redness, not tender to touch, no drainage noted. Will continue to monitor. [name], RN

## 2015-12-17 NOTE — Evaluation (Signed)
Occupational Therapy Assessment and Plan  Patient Details  Name: Marvin Hardy MRN: [id number] Date of Birth: March 18, 1961  OT Diagnosis: acute pain and muscle weakness (generalized) Rehab Potential: Rehab Potential (ACUTE ONLY): Good ELOS: 10- 12 days   Today's Date: 12/17/2015 OT Individual Time: 0800-0900 OT Individual Time Calculation (min): 60 min     Problem List:  Patient Active Problem List   Diagnosis Date Noted  . Hyponatremia   . Acute blood loss anemia   . Hypoalbuminemia due to protein-calorie malnutrition ([place])   . Adenocarcinoma ([place]) 12/16/2015  . Pathological fracture of left femur due to neoplastic disease ([place])   . Effusion of knee   . Fracture   . Mass of soft tissue   . Pain   . Femur fracture ([place]) 12/06/2015  . Femur fracture, left ([place]) 12/06/2015  . Closed fracture of shaft of left femur ([place])   . Swelling of right testicle     Past Medical History:  Past Medical History  Diagnosis Date  . Adenocarcinoma ([place]) 12/16/2015   Past Surgical History:  Past Surgical History  Procedure Laterality Date  . Femur im nail Left 12/09/2015    Procedure: INTRAMEDULLARY (IM) NAIL LEFT FEMUR;  Surgeon: Mcarthur Rossetti, MD;  Location: [place] [facility];  Service: Orthopedics;  Laterality: Left;    Assessment & Plan Clinical Impression: Patient is a 55 y.o. year old male right handed male with history of tobacco abuse. Presented 12/06/2015 when he felt a pop in his left leg while twisting while at work. Denied any fall. Patient had been living with a friend and was independent prior to admission. He currently is unsure of his discharge plan he has no local family. X-rays and imaging revealed left displaced transverse fractures of the mid femoral diaphysis as well as irregular lytic lesion and periosteal reaction involving the mid femoral diaphysis at the fracture site concerning for possibility of metastatic disease or multiple myeloma. CT abdomen and pelvis as well as CT of  the chest showed a 2.5 x 3.6 right lower lobe lung mass suspicious for neoplasm.Bilateral adrenal masses. 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 Lytic lesions of the femoral neck and intertrochanteric region But no evidence of skeletal metastasis elsewhere. 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 Follow-up oncology service of Dr. Earlie Server and Plan follow-up outpatient with plan of care. Underwent intramedullary nail placement left femur 12/09/2015 per Dr. Ninfa Linden. Hospital course pain management. Acute blood loss anemia 9.4 and monitored. Weightbearing as tolerated.  Patient transferred to CIR on 12/16/2015 .    Patient currently requires mod with basic self-care skills and min A for basic mobility with a RW secondary to muscle weakness and acute pain, decreased cardiorespiratoy endurance, abnormal tone, unbalanced muscle activation, decreased coordination and decreased motor planning and decreased standing balance and decreased balance strategies.  Prior to hospitalization, patient could complete ADL with mod I and indepedent .  Patient will benefit from skilled intervention to decrease level of assist with basic self-care skills and increase independence with basic self-care skills prior to discharge home with care partner.  Anticipate patient will require intermittent supervision and follow up home health.  OT - End of Session Endurance Deficit: Yes OT Assessment Rehab Potential (ACUTE ONLY): Good Barriers to Discharge: Other (comment) Barriers to Discharge Comments: discharging to family in [place] OT Patient demonstrates impairments in  the following area(s): Balance;Behavior;Edema;Endurance;Motor;Pain;Perception;Safety;Sensory;Skin Integrity OT Basic ADL's Functional Problem(s): Grooming;Bathing;Dressing;Toileting OT Advanced  ADL's Functional Problem(s): Simple Meal Preparation OT Transfers Functional Problem(s): Toilet;Tub/Shower OT Additional Impairment(s): None OT Plan OT Intensity: Minimum of 1-2 x/day, 45 to 90 minutes OT Frequency: 5 out of 7 days OT Duration/Estimated Length of Stay: 10- 12 days OT Treatment/Interventions: Medical illustrator training;Community reintegration;Discharge planning;Disease mangement/prevention;Pain management;Splinting/orthotics;UE/LE Coordination activities;Skin care/wound managment;Neuromuscular re-education;Functional mobility training;Self Care/advanced ADL retraining;UE/LE Strength taining/ROM;Therapeutic Exercise;Psychosocial support;Functional electrical stimulation;DME/adaptive equipment instruction;Patient/family education;Therapeutic Activities;Wheelchair propulsion/positioning OT Self Feeding Anticipated Outcome(s): n/a OT Basic Self-Care Anticipated Outcome(s): mod I  OT Toileting Anticipated Outcome(s): mod I  OT Bathroom Transfers Anticipated Outcome(s): mod I  OT Recommendation Recommendations for Other Services: Neuropsych consult Patient destination: Home Follow Up Recommendations: None   Skilled Therapeutic Intervention OT eval initiated  with OT goals, purpose and role discussed. Pt reported having a poor night of rest and has been limited by pain; however pt willing to participate.  Self care retraining at sink level, sitting in recliner. Focus on sit to stands, standing balance with and with out UE support during self care tasks. Pt required min A for standing balance with VC for best positioning for peri hygiene to decr pain. Continued focus on activity tolerance. PT able to inconsistently reach down to his feet for threading LB clothing. Pt may benefit from AE intermittently for pain management during ADL tasks. Pt able to ambulate around room with min A with RW with more than reasonable amt of time. Pt did require A with bilateral LEs to return to bed in prep  for dopplers.   OT Evaluation Precautions/Restrictions  Precautions Precautions: Fall Restrictions Weight Bearing Restrictions: No LLE Weight Bearing: Weight bearing as tolerated General Chart Reviewed: Yes Family/Caregiver Present: No  Pain Pain Assessment Pain Assessment: 0-10 Pain Score: 9  Faces Pain Scale: Hurts little more Pain Type: Surgical pain Pain Location: Hip Pain Orientation: Left Pain Descriptors / Indicators: Aching Pain Frequency: Constant Pain Onset: On-going Patients Stated Pain Goal: 4 Pain Intervention(s): Medication (See eMAR) Home Living/Prior Functioning Home Living Available Help at Discharge: Family, Available PRN/intermittently Type of Home: House Additional Comments: Pt states "essentailly I'm homeless". Pt states he lives with a friend who has moved and reports today he will return to [facility] after rehab to stay with his family there.  Lives With:  (alone but going to live with children in [place]) ADL ADL ADL Comments: see functional navigator Vision/Perception  Vision- History Patient Visual Report: No change from baseline Vision- Assessment Vision Assessment?: No apparent visual deficits  Cognition Overall Cognitive Status: Within Functional Limits for tasks assessed Arousal/Alertness: Awake/alert Orientation Level: Person;Place;Situation Person: Oriented Place: Oriented Situation: Oriented Year: 2017 Month: May Day of Week: Correct Immediate Memory Recall: Sock;Blue;Bed Memory Recall: Sock;Blue (did not recall bed) Memory Recall Sock: Without Cue Memory Recall Blue: Without Cue Attention: Selective Selective Attention: Appears intact Awareness: Appears intact Problem Solving: Appears intact Safety/Judgment: Appears intact Sensation Sensation Light Touch: Appears Intact Stereognosis: Appears Intact Hot/Cold: Appears Intact Proprioception: Appears Intact Coordination Gross Motor Movements are Fluid and Coordinated: No Fine Motor  Movements are Fluid and Coordinated: Yes Coordination and Movement Description: difficulty with coordination in left Le due to pain Motor  Motor Motor - Skilled Clinical Observations: generalized weakness and acute pain Mobility  Bed Mobility Bed Mobility: Sit to Supine Sit to Supine: 3: Mod assist Transfers Transfers: Sit to Stand;Stand to Sit Sit to Stand: 4: Min assist Stand to Sit: 4: Min assist  Trunk/Postural Assessment  Cervical Assessment Cervical Assessment: Within Functional Limits Thoracic Assessment Thoracic Assessment: Within Functional Limits (has large mass on left side of trunk) Lumbar Assessment Lumbar Assessment: Within Functional Limits Postural Control Postural Control: Within Functional Limits  Balance Balance Balance Assessed: Yes Dynamic Sitting Balance Dynamic Sitting - Balance Support: During functional activity;Bilateral upper extremity supported Dynamic Sitting - Level of Assistance: 6: Modified independent (Device/Increase time) Static Standing Balance Static Standing - Balance Support: During functional activity;No upper extremity supported Static Standing - Level of Assistance: 4: Min assist Dynamic Standing Balance Dynamic Standing - Balance Support: During functional activity;Bilateral upper extremity supported Dynamic Standing - Level of Assistance: 4: Min assist Dynamic Standing - Comments: requires both UEs for support Extremity/Trunk Assessment RUE Assessment RUE Assessment: Within Functional Limits LUE Assessment LUE Assessment: Within Functional Limits   See Function Navigator for Current Functional Status.   Refer to Care Plan for Long Term Goals  Recommendations for other services: Neuropsych  Discharge Criteria: Patient will be discharged from OT if patient refuses treatment 3 consecutive times without medical reason, if treatment goals not met, if there is a change in medical status, if patient makes no progress towards goals  or if patient is discharged from hospital.  The above assessment, treatment plan, treatment alternatives and goals were discussed and mutually agreed upon: by patient  Nicoletta Ba 12/17/2015, 9:49 AM

## 2015-12-17 NOTE — Progress Notes (Signed)
Patient complained of shortness of breath. Patient assisted sitting up in bed and assisted out of bed to recliner. Patient noted to be anxious. VSS. Continue with plan of care.  Mliss Sax

## 2015-12-17 NOTE — Plan of Care (Signed)
Problem: RH PAIN MANAGEMENT Goal: RH STG PAIN MANAGED AT OR BELOW PT'S PAIN GOAL <4  Outcome: Not Progressing Pain level remains higher than 4

## 2015-12-17 NOTE — Progress Notes (Signed)
[facility]     PROGRESS NOTE  Subjective/Complaints:  Pt sitting up in his chair this morning.  He had "hot flashes overnight, but is ready for his first day of therapies.  He notes a nodule he noticed on the back of his head.   ROS: Denies CP, SOB, N/V/D.  Objective: Vital Signs: Blood pressure 114/53, pulse 65, temperature 97.9 F (36.6 C), temperature source Oral, resp. rate 18, height '6\' 6"'$  (1.981 m), weight 73.12 kg (161 lb 3.2 oz), SpO2 98 %. No results found.  Recent Labs  12/16/15 1807  WBC 7.6  HGB 7.5*  HCT 22.3*  PLT 444*    Recent Labs  12/16/15 1807  NA 133*  K 3.9  CL 96*  GLUCOSE 121*  BUN 17  CREATININE 0.77  CALCIUM 8.2*   CBG (last 3)  No results for input(s): GLUCAP in the last 72 hours.  Wt Readings from Last 3 Encounters:  12/16/15 73.12 kg (161 lb 3.2 oz)  12/06/15 73.211 kg (161 lb 6.4 oz)    Physical Exam:  BP 114/53 mmHg  Pulse 65  Temp(Src) 97.9 F (36.6 C) (Oral)  Resp 18  Ht '6\' 6"'$  (1.981 m)  Wt 73.12 kg (161 lb 3.2 oz)  BMI 18.63 kg/m2  SpO2 98% Constitutional: He appearscachetic. NAD. Vital signs reviewed. Head: Normocephalic. Atraumatic.  Eyes: EOM and Conj are normal.  Cardiovascular: Normal rate and regular rhythm. no murmurs rubs,  Respiratory:  Decreased breath sounds at the bases but clear to auscultation. Effort normal. No distress GI: Soft. Bowel sounds are normal. He exhibits no distension.  Musc: pain in left hip with PROM of leg.  Neurological: He is alert and oriented. Sensory normal.  Motor: B/l UE: 4+/5 prox, 5/5 distal.  RLE: 4+/5 proximally, 5/5 distally.  LLE: (pain) 2/5 proximally, 4+/5 distally.  Skin:  Proximal and distal incisions with staples/intact, mild serosanginous drainage.  Left knee effusion Left thigh is swollen and tender no ecchymosis Small nodule posterior scalp without erythema or warmth  Assessment/Plan: 1. Functional deficits secondary to  pathologic left femur fracture/poorly differentiated adenocarcinoma, s/p IM nail placement which require 3+ hours per day of interdisciplinary therapy in a comprehensive inpatient rehab setting. Physiatrist is providing close team supervision and 24 hour management of active medical problems listed below. Physiatrist and rehab team continue to assess barriers to discharge/monitor patient progress toward functional and medical goals.  Function:  Bathing Bathing position      Bathing parts      Bathing assist        Upper Body Dressing/Undressing Upper body dressing                    Upper body assist        Lower Body Dressing/Undressing Lower body dressing                                  Lower body assist        Toileting Toileting          Toileting assist     Transfers Chair/bed transfer             Locomotion Ambulation           Wheelchair          Cognition Comprehension Comprehension assist level: Follows complex conversation/direction with no assist  Expression Expression assist level: Expresses complex  ideas: With no assist  Social Interaction Social Interaction assist level: Interacts appropriately with others - No medications needed.  Problem Solving Problem solving assist level: Solves basic problems with no assist  Memory Memory assist level: Complete Independence: No helper    Medical Problem List and Plan: 1. Limited functional mobility secondary to pathologic left femur fracture/poorly differentiated adenocarcinoma. Status post intramedullary nail placement 12/09/2015.   Weightbearing as tolerated  Begin CIR 2. DVT Prophylaxis/Anticoagulation: SCDs.   Vascular study pending 3. Pain Management: OxyContin sustained release 40 mg every 12 hours, oxycodone and Robaxin as needed.   Monitor with increased mobility, will increase as necessary  4. Mood: team to provide ego support. Patient encouraged to be open with  team regarding any need for support, counseling, etc 5. Neuropsych: This patient is capable of making decisions on his own behalf. 6. Skin/Wound Care: Routine skin checks 7. Fluids/Electrolytes/Nutrition: Routine I&O's   Mild hyponatremia: 133 on 5/16 (stable   Will cont to monitor 8. Acute blood loss anemia.   Hb 7.5 on 5/16  Will hemoccult stools  Will order labs for tomorrow 9. Constipation. Laxative assistance 10. Tobacco abuse. Counseling 11. Hypoalbuminemia:  Supplement started on 5/17  LOS (Days) 1 A FACE TO FACE EVALUATION WAS PERFORMED  Rya Rausch Lorie Phenix 12/17/2015 8:21 AM

## 2015-12-17 NOTE — Evaluation (Signed)
Physical Therapy Assessment and Plan  Patient Details  Name: Marvin Hardy MRN: [id number] Date of Birth: 02/03/1961  PT Diagnosis: Difficulty walking, Muscle weakness and Pain in joint Rehab Potential: Good ELOS: 10-14   Today's Date: 12/17/2015 PT Individual Time: 3343-5686 PT Individual Time Calculation (min): 75 min    Problem List:  Patient Active Problem List   Diagnosis Date Noted  . Hyponatremia   . Acute blood loss anemia   . Hypoalbuminemia due to protein-calorie malnutrition ([place])   . Adenocarcinoma ([place]) 12/16/2015  . 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 Effusion of knee   . Fracture   . Mass of soft tissue   . Pain   . Femur fracture ([place]) 12/06/2015  . Femur fracture, left ([place]) 12/06/2015  . Closed fracture of shaft of left femur ([place])   . Swelling of right testicle     Past Medical History:  Past Medical History  Diagnosis Date  . Adenocarcinoma ([place]) 12/16/2015   Past Surgical History:  Past Surgical History  Procedure Laterality Date  . Femur im nail Left 12/09/2015    Procedure: INTRAMEDULLARY (IM) NAIL LEFT FEMUR;  Surgeon: Mcarthur Rossetti, MD;  Location: [facility];  Service: Orthopedics;  Laterality: Left;    Assessment & Plan Clinical Impression: Patient is a31 y.o. right handed male with history of tobacco abuse. Presented 12/06/2015 when he felt a pop in his left leg while twisting while at work. Denied any fall. Patient had been living with a friend and was independent prior to admission. He currently is unsure of his discharge plan he has no local family. X-rays and imaging revealed left displaced transverse fractures of the mid femoral diaphysis as well as irregular lytic lesion and periosteal reaction involving the mid femoral diaphysis at the fracture site concerning for possibility of metastatic disease or multiple myeloma. CT abdomen and pelvis as well as CT of the chest showed a 2.5 x 3.6 right lower lobe  lung mass suspicious for neoplasm.Bilateral adrenal masses. 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 Lytic lesions of the femoral neck and intertrochanteric region But no evidence of skeletal metastasis elsewhere. 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 Follow-up oncology service of Dr. Earlie Server and Plan follow-up outpatient with plan of care. Underwent intramedullary nail placement left femur 12/09/2015 per Dr. Ninfa Linden. Hospital course pain management. Acute blood loss anemia 9.4 and monitored. Weightbearing as tolerated.  Patient transferred to CIR on 12/16/2015 .   Patient currently requires mod with mobility secondary to muscle weakness and muscle joint tightness, decreased cardiorespiratoy endurance and decreased standing balance, decreased balance strategies and difficulty maintaining precautions.  Prior to hospitalization, patient was independent  with mobility and lived with  (alone but going to live with children in [place]) in a [place] home.  Home access is 8Stairs to enter.  Patient will benefit from skilled PT intervention to maximize safe functional mobility, minimize fall risk and decrease caregiver burden for planned discharge home with intermittent assist.  Anticipate patient will benefit from follow up [facility] at discharge.  PT - End of Session Activity Tolerance: Tolerates 30+ min activity with multiple rests Endurance Deficit: Yes PT Assessment Rehab Potential (ACUTE/IP ONLY): Good Barriers to Discharge: Inaccessible home environment;Decreased caregiver support PT Patient demonstrates impairments in the following area(s): Balance;Endurance;Motor;Pain;Safety PT Transfers Functional Problem(s): Bed  Mobility;Bed to Chair;Car;Furniture;Floor PT Locomotion Functional Problem(s): Ambulation;Wheelchair Mobility;Stairs PT  Plan PT Intensity: Minimum of 1-2 x/day ,45 to 90 minutes PT Frequency: 5 out of 7 days PT Duration Estimated Length of Stay: 10-14 PT Treatment/Interventions: Ambulation/gait training;Balance/vestibular training;Cognitive remediation/compensation;Community reintegration;Discharge planning;Disease management/prevention;DME/adaptive equipment instruction;Functional mobility training;Neuromuscular re-education;Pain management;Patient/family education;Psychosocial support;Skin care/wound management;Stair training;Therapeutic Activities;Therapeutic Exercise;UE/LE Strength taining/ROM;UE/LE Coordination activities;Wheelchair propulsion/positioning PT Transfers Anticipated Outcome(s): Mod I  PT Locomotion Anticipated Outcome(s): Mod I with LRAD PT Recommendation Follow Up Recommendations: Home health PT Patient destination: Home Equipment Recommended: Rolling walker with 5" wheels;Wheelchair (measurements);Wheelchair cushion (measurements)  Skilled Therapeutic Intervention PT performed Eval and initiated treatment; see below for results. PT instructed patient in car transfer with mod A from PT for LLE management and mod cues for proper UE positioning. Gait training for 25f with RW with min A and cues for proper AD management. WC mobility for 1579fand 756fith supervision A from PT with min cues for obstacle negotiation and improved equal BUE propulsion to prevent lateral drift. Stair training for 4 steps with mod A and mod cues for improved UE postioning and proper step-to gait pattern to maintain WBAT status.  Patient left supine in bed with call bell within reach.   PT Evaluation Precautions/Restrictions Precautions Precautions: Fall Restrictions Weight Bearing Restrictions: No LLE Weight Bearing: Weight bearing as tolerated General Chart Reviewed: Yes Family/Caregiver Present: No Vital Signs  Pain Pain Assessment Pain Assessment: 0-10 Pain Score: 9  Pain Type: Surgical pain Pain  Location: Hip Pain Orientation: Left Pain Descriptors / Indicators: Throbbing Pain Onset: On-going Patients Stated Pain Goal: 6 Pain Intervention(s): Ambulation/increased activity Home Living/Prior Functioning Home Living Available Help at Discharge: Family;Available PRN/intermittently Type of Home: Apartment Home Access: Stairs to enter Entrance Stairs-Number of Steps: 8 Entrance Stairs-Rails: Right;Left Home Layout: One level Bathroom Shower/Tub:  (unknown ) Bathroom Toilet: Standard Additional Comments: Pt states "essentailly I'm homeless". Pt states he lives with a friend who has moved and reports today [facility] [place] [facility] with [facility] family there.  Lives With:  (alone but [place] live [place] in [place] Level of Independence: Independent with basic ADLs  Able to Take Stairs?: Yes Vocation: Full time employment Vocation Requirements: climb up ladders.  Vision/Perception     Cognition Overall Cognitive Status: Within Functional Limits for tasks assessed Arousal/Alertness: Awake/alert Attention: Selective Selective Attention: Appears intact Memory: Appears intact Awareness: Appears intact Problem Solving: Appears intact Safety/Judgment: Appears intact Sensation Sensation Light Touch: Appears Intact Stereognosis: Appears Intact Hot/Cold: Appears Intact Proprioception: Appears Intact Coordination Gross Motor Movements are Fluid and Coordinated: No Fine Motor Movements are Fluid and Coordinated: Yes Coordination and Movement Description: difficulty with coordination in left Le due to pain Motor  Motor Motor: Within Functional Limits Motor - Skilled Clinical Observations: generalized weakness and acute pain  Mobility Bed Mobility Bed Mobility: Rolling Right;Rolling Left;Supine to Sit;Sit to Supine Rolling Right: 4: Min guard Rolling Left: 4: Min guard Supine to Sit: 3: Mod assist Supine to Sit Details: Verbal cues for  sequencing;Verbal cues for technique;Verbal cues for precautions/safety;Visual cues/gestures for sequencing;Tactile cues for weight shifting;Tactile cues for placement;Verbal cues for safe use of DME/AE;Manual facilitation for weight shifting;Manual facilitation for placement Sit to Supine: 3: Mod assist Sit to Supine - Details: Verbal cues for sequencing;Verbal cues for technique;Verbal cues for precautions/safety;Verbal cues for safe use of DME/AE;Manual facilitation for weight shifting;Tactile cues for weight shifting;Manual facilitation for placement;Manual facilitation for weight bearing Transfers Transfers: Yes Sit  to Stand: 4: Min assist Sit to Stand Details: Verbal cues for sequencing;Verbal cues for technique;Verbal cues for precautions/safety;Tactile cues for weight shifting;Tactile cues for posture;Tactile cues for placement;Verbal cues for safe use of DME/AE Stand to Sit: 4: Min assist Stand to Sit Details (indicate cue type and reason): Visual cues for safe use of DME/AE;Visual cues/gestures for precautions/safety;Visual cues/gestures for sequencing;Verbal cues for sequencing;Verbal cues for precautions/safety;Verbal cues for technique;Verbal cues for gait pattern;Verbal cues for safe use of DME/AE;Tactile cues for weight shifting;Tactile cues for posture Stand Pivot Transfers: 3: Mod assist;4: Min assist Stand Pivot Transfer Details: Verbal cues for sequencing;Verbal cues for technique;Verbal cues for precautions/safety;Verbal cues for safe use of DME/AE;Tactile cues for weight shifting;Verbal cues for gait pattern Locomotion  Ambulation Ambulation: Yes Ambulation/Gait Assistance: 4: Min assist Ambulation Distance (Feet): 60 Feet Assistive device: Rolling walker Ambulation/Gait Assistance Details: Verbal cues for sequencing;Verbal cues for technique;Verbal cues for precautions/safety;Verbal cues for gait pattern;Verbal cues for safe use of DME/AE;Tactile cues for weight  shifting Gait Gait: Yes Gait Pattern: Impaired Gait Pattern: Step-to pattern;Decreased step length - right;Antalgic;Abducted - left;Poor foot clearance - left;Poor foot clearance - right Stairs / Additional Locomotion Stairs: Yes Stairs Assistance: 3: Mod assist Stairs Assistance Details: Verbal cues for sequencing;Verbal cues for technique;Tactile cues for weight shifting;Tactile cues for placement;Tactile cues for weight beaing;Verbal cues for precautions/safety;Verbal cues for gait pattern;Verbal cues for safe use of DME/AE;Manual facilitation for weight shifting Stair Management Technique: Two rails Number of Stairs: 4 Height of Stairs: 6 Wheelchair Mobility Wheelchair Mobility: Yes Wheelchair Assistance: 5: Investment banker, operational Details: Verbal cues for technique;Verbal cues for Information systems manager: Both upper extremities Wheelchair Parts Management: Supervision/cueing Distance: 150  Trunk/Postural Assessment  Cervical Assessment Cervical Assessment: Within Functional Limits Thoracic Assessment Thoracic Assessment: Within Functional Limits (has large mass on left side of trunk) Lumbar Assessment Lumbar Assessment: Within Functional Limits Postural Control Postural Control: Within Functional Limits  Balance Balance Balance Assessed: Yes Dynamic Sitting Balance Dynamic Sitting - Balance Support: During functional activity;Bilateral upper extremity supported Dynamic Sitting - Level of Assistance: 5: Stand by assistance Static Standing Balance Static Standing - Balance Support: During functional activity;No upper extremity supported Static Standing - Level of Assistance: 4: Min assist Dynamic Standing Balance Dynamic Standing - Balance Support: During functional activity;Bilateral upper extremity supported Dynamic Standing - Level of Assistance: 4: Min assist Dynamic Standing - Balance Activities: Lateral lean/weight shifting Dynamic Standing  - Comments: requires both UEs for support Extremity Assessment  RUE Assessment RUE Assessment: Within Functional Limits LUE Assessment LUE Assessment: Within Functional Limits RLE Assessment RLE Assessment: Within Functional Limits LLE Assessment LLE Assessment: Exceptions to WFL LLE AROM (degrees) LLE Overall AROM Comments: Lacking ~ 10 degrees terminal knee extension in sitting. all other motions tested WNL LLE Strength LLE Overall Strength Comments: Grossly 3/5 with significant increase in pain with all movent.     See Function Navigator for Current Functional Status.   Refer to Care Plan for Long Term Goals  Recommendations for other services: None  Discharge Criteria: Patient will be discharged from PT if patient refuses treatment 3 consecutive times without medical reason, if treatment goals not met, if there is a change in medical status, if patient makes no progress towards goals or if patient is discharged from hospital.  The above assessment, treatment plan, treatment alternatives and goals were discussed and mutually agreed upon: by patient  Lorie Phenix 12/17/2015, 1:31 PM

## 2015-12-17 NOTE — Progress Notes (Signed)
Social Work  Social Work Assessment and Plan  Patient Details  Name: Marvin Hardy MRN: [id number] Date of Birth: 01/03/1961  Today's Date: 12/17/2015  Problem List:  Patient Active Problem List   Diagnosis Date Noted  . Hyponatremia   . Acute blood loss anemia   . Hypoalbuminemia due to protein-calorie malnutrition ([place])   . Adenocarcinoma ([place]) 12/16/2015  . 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 Effusion of knee   . Fracture   . Mass of soft tissue   . Pain   . Femur fracture ([place]) 12/06/2015  . Femur fracture, left ([place]) 12/06/2015  . Closed fracture of shaft of left femur ([place])   . Swelling of right testicle    Past Medical History:  Past Medical History  Diagnosis Date  . Adenocarcinoma ([place]) 12/16/2015   Past Surgical History:  Past Surgical History  Procedure Laterality Date  . Femur im nail Left 12/09/2015    Procedure: INTRAMEDULLARY (IM) NAIL LEFT FEMUR;  Surgeon: Mcarthur Rossetti, MD;  Location: [facility];  Service: Orthopedics;  Laterality: Left;   Social History:  reports that he has been smoking Cigars.  He does not have any smokeless tobacco history on file. He reports that he drinks alcohol. He reports that he does not use illicit drugs.  Family / Support Systems Marital Status: Separated How Long?: 3 years Patient Roles: Parent, Other (Comment) (siblings) Children: Tamika-daughter [phone] Other Supports: Bradly Chris [phone]  [name]-brother [phone] Anticipated Caregiver: self and sister to assist Ability/Limitations of Caregiver: no support systems here sister in [place] and brother in [place] Caregiver Availability: Other (Comment) (Family coming up with a plan) Family Dynamics: Close with his children and siblings. The plan currently is for his brother to come up and take him to his siter's home in [place]. He is aware he will need some assistance and also have numerous MD  appointments for his cancer. His insurance will not cover NH now he is on rehab, pt aware of this.  Social History Preferred language: English Religion:  Cultural Background: No issues Education: High School Read: Yes Write: Yes Employment Status: Employed Name of Employer: [employer] Return to Work Plans: Probably not plans to apply for disability Freight forwarder Issues: No issues Guardian/Conservator: None-according to MD pt is capable of making his own decisions while here.   Abuse/Neglect Physical Abuse: Denies Verbal Abuse: Denies Sexual Abuse: Denies Exploitation of patient/patient's resources: Denies Self-Neglect: Denies  Emotional Status Pt's affect, behavior adn adjustment status: Pt is motiovated to be here and work on his independence. He is still processing everything that has happened to him while here. He reports the path report is still pending. He wants to be as independent and as strong as he can be for the road ahead of him.  Recent Psychosocial Issues: other health issues-cancer findings while here in hospital Pyschiatric History: No history will refer to neuro-psych for coping and support with all that he has been through since being hospitalized. He is open and honest in his concerns and just feels he is in limbo with not knowing yet. He reports: " The not waiting is the hardest." Substance Abuse History: Tobacco plans to quit now  Patient / Family Perceptions, Expectations & Goals Pt/Family understanding of illness & functional limitations: Pt and family can explain his fracture and cancer findings. His brother has spoken with the MD and is very involved according to pt. He also talks with the MD and feels  he knows what is going on up to this point. He feels the more information the better. Premorbid pt/family roles/activities: Father, brother, friend, employee, etc Anticipated changes in roles/activities/participation: plans to apply for disability due  to cancer diagnosis Pt/family expectations/goals: Pt states: " I want to be able to walk on my own before I leave here, dont; want my fmaily to have to do too much for me."  Sister states: " We are still working this out, surprised about all fo this."  [employer]: None Premorbid Home Care/DME Agencies: None Transportation available at discharge: [employer] referrals recommended: Neuropsychology, Support group (specify)  Discharge Planning Living Arrangements: Non-relatives/Friends Support Systems: Children, Other relatives, Friends/neighbors Type of Residence: Private residence Administrator, sports: Multimedia programmer (specify) ([employer] of [place]) Museum/gallery curator Resources: Employment Museum/gallery curator Screen Referred: No Living Expenses: Other (Comment) (staying with friend who has now moved away) Money Management: Patient Does the patient have any problems obtaining your medications?: No Home Management: Selff- sibling will be doing it now Patient/Family Preliminary Plans: Pt has no where to go so brother to come up from [place] and transport him to sister's home in [place] when he is discharged from here. Will confirm plan with brother when returns my call. Pt will need referral for MD to follow up with at sister's.  Aware BCBS will not cover for him to go NH after rehab.  Social Work Anticipated Follow Up Needs: HH/OP, Support Group  Clinical Impression Pleasant gentleman who is motivated to improve and be independent with mobility before leaving here. Still firming up plan at discharge for brother to transport him to sister's home in [place]. Will need to know discharge date and can then plan trip. Will have neuro-psych see while here for coping and support. Pt seems to be handling all of this with grace and just wants to be kept informed if  More information obtained. Will work on discharge plans and needs.  Elease Hashimoto 12/17/2015, 1:36 PM

## 2015-12-17 NOTE — Progress Notes (Signed)
Patient information reviewed and entered into eRehab system by Jayston Trevino, RN, CRRN, PPS Coordinator.  Information including medical coding and functional independence measure will be reviewed and updated through discharge.    

## 2015-12-17 NOTE — Progress Notes (Signed)
Oncology Nurse Navigator Documentation  Oncology Nurse Navigator Flowsheets 12/17/2015  Patient Visit Type Other  Treatment Phase Other  Acuity Level 1  Time Spent with Patient 15   Updated Dr. Julien Nordmann, patient is in Rehab at this time.  Will schedule when discharged per Dr. Julien Nordmann

## 2015-12-17 NOTE — IPOC Note (Addendum)
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 ([place])   Hyponatremia   Acute blood loss anemia   Hypoalbuminemia due to protein-calorie malnutrition ([place])     Functional Problem List: Nursing Pain, Skin Integrity  PT Balance, Endurance, Motor, Pain, Safety  OT Balance, Behavior, Edema, Endurance, Motor, Pain, Perception, Safety, Sensory, Skin Integrity  SLP    TR         Basic ADL's: OT Grooming, Bathing, Dressing, Toileting     Advanced  ADL's: OT Simple Meal Preparation     Transfers: PT Bed Mobility, Bed to Chair, Car, [employer], Futures trader, Metallurgist: PT Ambulation, Emergency planning/management officer, Stairs     Additional Impairments: OT None  SLP        TR      Anticipated Outcomes Item Anticipated Outcome  Self Feeding n/a  Swallowing      Basic self-care  mod I   Toileting  mod I    Bathroom Transfers mod I   Bowel/Bladder  Mod I  Transfers  Mod I   Locomotion  Mod I with LRAD  Communication     Cognition     Pain  <3  Safety/Judgment  Supervision   Therapy Plan: PT Intensity: Minimum of 1-2 x/day ,45 to 90 minutes PT Frequency: 5 out of 7 days PT Duration Estimated Length of Stay: 10-14 OT Intensity: Minimum of 1-2 x/day, 45 to 90 minutes OT Frequency: 5 out of 7 days OT Duration/Estimated Length of Stay: 10- 12 days         Team Interventions: Nursing Interventions Patient/Family Education, Disease Management/Prevention, Pain Management, Skin Care/Wound Management  PT interventions Ambulation/gait training, Training and development officer, Cognitive remediation/compensation, Community reintegration, Discharge planning, Disease management/prevention, DME/adaptive equipment instruction, Functional mobility training,  Neuromuscular re-education, Pain management, Patient/family education, Psychosocial support, Skin care/wound management, Stair training, Therapeutic Activities, Therapeutic Exercise, UE/LE Strength taining/ROM, UE/LE Coordination activities, Wheelchair propulsion/positioning  OT Interventions Training and development officer, Community reintegration, Discharge planning, Disease mangement/prevention, Pain management, Splinting/orthotics, UE/LE Coordination activities, Skin care/wound managment, Neuromuscular re-education, Functional mobility training, Self Care/advanced ADL retraining, UE/LE Strength taining/ROM, Therapeutic Exercise, Psychosocial support, Functional electrical stimulation, DME/adaptive equipment instruction, Patient/family education, Therapeutic Activities, Wheelchair propulsion/positioning  SLP Interventions    TR Interventions    SW/CM Interventions Discharge Planning, Barrister's clerk, Patient/Family Education    Team Discharge Planning: Destination: PT-Home ,OT- Home , SLP-  Projected Follow-up: PT-Home health PT, OT-  None, SLP-  Projected Equipment Needs: PT-Rolling walker with 5" wheels, Wheelchair (measurements), Wheelchair cushion (measurements), OT-  to be determined, SLP-  Equipment Details: PT- , OT-  Patient/family involved in discharge planning: PT- Patient,  OT-Patient, SLP-   MD ELOS: 9-12 days Medical Rehab Prognosis:  Fair Assessment: 55 y.o. right handed male with history of tobacco abuse. Presented 12/06/2015 when he felt a pop in his left leg while twisting while at work. Denied any fall. Patient had been living with a friend and was independent prior to admission.  X-rays and imaging revealed left displaced transverse fractures of the mid femoral diaphysis as well as irregular lytic lesion and periosteal reaction involving the mid femoral diaphysis at the fracture site concerning for possibility of metastatic disease or multiple myeloma. CT abdomen and pelvis as  well as CT of the chest showed a  2.5 x 3.6 right lower lobe lung mass suspicious for neoplasm.Bilateral adrenal masses. 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 Lytic lesions of the femoral neck and intertrochanteric region But no evidence of skeletal metastasis elsewhere. 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 Follow-up oncology service of Dr. Earlie Server and Plan follow-up outpatient with plan of care. Underwent intramedullary nail placement left femur 12/09/2015 per Dr. Ninfa Linden. Hospital course pain management. Acute blood loss anemia. Weightbearing as tolerated. Pt with resulting deficits in ambulation and generalized fatigue.  Will set goals for mod I with PT and Mod I with OT.  See Team Conference Notes for weekly updates to the plan of care

## 2015-12-17 NOTE — Care Management (Signed)
[facility] Individual Statement of Services  Patient Name:  Marvin Hardy  Date:  12/18/2015  Welcome to the [facility].  Our goal is to provide you with an individualized program based on your diagnosis and situation, designed to meet your specific needs.  With this comprehensive rehabilitation program, you will be expected to participate in at least 3 hours of rehabilitation therapies Monday-Friday, with modified therapy programming on the weekends.  Your rehabilitation program will include the following services:  Physical Therapy (PT), Occupational Therapy (OT), 24 hour per day rehabilitation nursing, Neuropsychology, Case Management (Social Worker), Rehabilitation Medicine, Nutrition Services and Pharmacy Services  Weekly team conferences will be held on Wednesday to discuss your progress.  Your Social Worker will talk with you frequently to get your input and to update you on team discussions.  Team conferences with you and your family in attendance may also be held.  Expected length of stay: 10-12 days Overall anticipated outcome: supervision/mod/i level  Depending on your progress and recovery, your program may change. Your Social Worker will coordinate services and will keep you informed of any changes. Your Social Worker's name and contact numbers are listed  below.  The following services may also be recommended but are not provided by the [facility] will be made to provide these services after discharge if needed.  Arrangements include referral to agencies that provide these services.  Your insurance has been verified to be:  [employer] [place] Your primary doctor is:  None  Pertinent information will be shared with your doctor and your insurance company.  Social Worker:  Ovidio Kin, [place] or ([phone]  Information discussed with and copy given to patient by: Elease Hashimoto, 12/18/2015, 8:31 AM

## 2015-12-17 NOTE — Progress Notes (Signed)
VASCULAR LAB PRELIMINARY  PRELIMINARY  PRELIMINARY  PRELIMINARY  Bilateral lower extremity venous duplex  completed.    Preliminary report:  Bilateral:  No evidence of DVT, superficial thrombosis, or Baker's Cyst.    Marvin Hardy, RVT 12/17/2015, 9:24 AM

## 2015-12-18 ENCOUNTER — Inpatient Hospital Stay (HOSPITAL_COMMUNITY): Payer: BLUE CROSS/BLUE SHIELD

## 2015-12-18 ENCOUNTER — Inpatient Hospital Stay (HOSPITAL_COMMUNITY): Payer: BLUE CROSS/BLUE SHIELD | Admitting: Physical Therapy

## 2015-12-18 ENCOUNTER — Inpatient Hospital Stay (HOSPITAL_COMMUNITY): Payer: BLUE CROSS/BLUE SHIELD | Admitting: Occupational Therapy

## 2015-12-18 DIAGNOSIS — D179 Benign lipomatous neoplasm, unspecified: Secondary | ICD-10-CM | POA: Insufficient documentation

## 2015-12-18 DIAGNOSIS — D62 Acute posthemorrhagic anemia: Secondary | ICD-10-CM

## 2015-12-18 DIAGNOSIS — E46 Unspecified protein-calorie malnutrition: Secondary | ICD-10-CM

## 2015-12-18 LAB — CBC WITH DIFFERENTIAL/PLATELET
BASOS ABS: 0 10*3/uL (ref 0.0–0.1)
BASOS PCT: 0 %
EOS ABS: 0 10*3/uL (ref 0.0–0.7)
EOS PCT: 0 %
HCT: 25 % — ABNORMAL LOW (ref 39.0–52.0)
Hemoglobin: 8.3 g/dL — ABNORMAL LOW (ref 13.0–17.0)
LYMPHS PCT: 23 %
Lymphs Abs: 1.7 10*3/uL (ref 0.7–4.0)
MCH: 32.7 pg (ref 26.0–34.0)
MCHC: 33.2 g/dL (ref 30.0–36.0)
MCV: 98.4 fL (ref 78.0–100.0)
MONO ABS: 1 10*3/uL (ref 0.1–1.0)
Monocytes Relative: 12 %
Neutro Abs: 5 10*3/uL (ref 1.7–7.7)
Neutrophils Relative %: 64 %
PLATELETS: 527 10*3/uL — AB (ref 150–400)
RBC: 2.54 MIL/uL — ABNORMAL LOW (ref 4.22–5.81)
RDW: 13.2 % (ref 11.5–15.5)
WBC: 7.7 10*3/uL (ref 4.0–10.5)

## 2015-12-18 MED ORDER — PANTOPRAZOLE SODIUM 40 MG PO TBEC
40.0000 mg | DELAYED_RELEASE_TABLET | Freq: Every day | ORAL | Status: DC
  Administered 2015-12-18 – 2015-12-27 (×10): 40 mg via ORAL
  Filled 2015-12-18 (×10): qty 1

## 2015-12-18 NOTE — Plan of Care (Signed)
Problem: RH PAIN MANAGEMENT Goal: RH STG PAIN MANAGED AT OR BELOW PT'S PAIN GOAL <4  Outcome: Not Progressing Pt reports as 6

## 2015-12-18 NOTE — Progress Notes (Addendum)
[facility] PHYSICAL MEDICINE & REHABILITATION     PROGRESS NOTE  Subjective/Complaints:  Patient lying in bed this morning. He states that he had some stomach pain overnight but it has improved.  ROS: Denies CP, SOB, N/V/D.  Objective: Vital Signs: Blood pressure 98/53, pulse 69, temperature 98.6 F (37 C), temperature source Oral, resp. rate 18, height '6\' 6"'$  (1.981 m), weight 73.12 kg (161 lb 3.2 oz), SpO2 99 %. No results found.  Recent Labs  12/16/15 1807 12/18/15 0534  WBC 7.6 7.7  HGB 7.5* 8.3*  HCT 22.3* 25.0*  PLT 444* 527*    Recent Labs  12/16/15 1807  NA 133*  K 3.9  CL 96*  GLUCOSE 121*  BUN 17  CREATININE 0.77  CALCIUM 8.2*   CBG (last 3)  No results for input(s): GLUCAP in the last 72 hours.  Wt Readings from Last 3 Encounters:  12/16/15 73.12 kg (161 lb 3.2 oz)  12/06/15 73.211 kg (161 lb 6.4 oz)    Physical Exam:  BP 98/53 mmHg  Pulse 69  Temp(Src) 98.6 F (37 C) (Oral)  Resp 18  Ht '6\' 6"'$  (1.981 m)  Wt 73.12 kg (161 lb 3.2 oz)  BMI 18.63 kg/m2  SpO2 99% Constitutional: He appearscachetic. NAD. Vital signs reviewed. Head: Normocephalic. Atraumatic.  Eyes: EOM and Conj are normal.  Cardiovascular: Normal rate and regular rhythm. no murmurs rubs,  Respiratory:  Decreased breath sounds at the bases but clear to auscultation. Effort normal. No distress GI: Soft. Bowel sounds are normal. He exhibits no distension.  Musc: pain in left hip with PROM of leg.  Neurological: He is alert and oriented. Sensory normal.  Motor: B/l UE: 4+/5 prox, 5/5 distal.  RLE: 4+/5 proximally, 5/5 distally.  LLE: (pain) 2/5 proximally, 4+/5 distally.  Skin:  Incision with dressing with dried drainage.  Left knee effusion Left thigh is swollen and tender no ecchymosis Small nodule posterior scalp without erythema or warmth, nontender, likely lipoma  Assessment/Plan: 1. 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 Physiatrist is providing close team supervision and 24 hour management of active medical problems listed below. Physiatrist and rehab team continue to assess barriers to discharge/monitor patient progress toward functional and medical goals.  Function:  Bathing Bathing position   Position: Wheelchair/chair at sink  Bathing parts Body parts bathed by patient: Right arm, Left arm, Chest, Abdomen, Front perineal area, Right upper leg, Left upper leg Body parts bathed by helper: Buttocks, Right lower leg, Left lower leg, Back  Bathing assist Assist Level: Touching or steadying assistance(Pt > 75%)      Upper Body Dressing/Undressing Upper body dressing   What is the patient wearing?: Pull over shirt/dress     Pull over shirt/dress - Perfomed by patient: Thread/unthread right sleeve, Thread/unthread left sleeve, Put head through opening, Pull shirt over trunk          Upper body assist Assist Level: Set up      Lower Body Dressing/Undressing Lower body dressing   What is the patient wearing?: Underwear, Pants, Socks, Shoes Underwear - Performed by patient: Thread/unthread right underwear leg Underwear - Performed by helper: Pull underwear up/down, Thread/unthread left underwear leg Pants- Performed by patient: Thread/unthread right pants leg, Thread/unthread left pants leg, Fasten/unfasten pants Pants- Performed by helper: Pull pants up/down       Socks - Performed by  helper: Don/doff right sock, Don/doff left sock   Shoes - Performed by helper: Don/doff right shoe, Don/doff left shoe, Fasten right, Fasten left          Lower body assist Assist for lower body dressing: Touching or steadying assistance (Pt > 75%)      Toileting Toileting          Toileting assist     Transfers Chair/bed transfer   Chair/bed transfer method:  Squat pivot Chair/bed transfer assist level: Touching or steadying assistance (Pt > 75%) Chair/bed transfer assistive device: Armrests, Bedrails     Locomotion Ambulation     Max distance: 60 Assist level: Touching or steadying assistance (Pt > 75%)   Wheelchair   Type: Manual Max wheelchair distance: 150 Assist Level: Supervision or verbal cues  Cognition Comprehension Comprehension assist level: Follows complex conversation/direction with no assist  Expression Expression assist level: Expresses complex ideas: With extra time/assistive device  Social Interaction Social Interaction assist level: Interacts appropriately with others with medication or extra time (anti-anxiety, antidepressant).  Problem Solving Problem solving assist level: Solves complex 90% of the time/cues < 10% of the time  Memory Memory assist level: Complete Independence: No helper    Medical Problem List and Plan: 1. Limited functional mobility secondary to pathologic left femur fracture/poorly differentiated adenocarcinoma. Status post intramedullary nail placement 12/09/2015.   Weightbearing as tolerated  Continue CIR 2. DVT Prophylaxis/Anticoagulation: SCDs.   Vascular study negative for DVT on 5/17 3. Pain Management: OxyContin sustained release 40 mg every 12 hours, oxycodone and Robaxin as needed.   Monitor with increased mobility, will increase as necessary  4. Mood: team to provide ego support. Patient encouraged to be open with team regarding any need for support, counseling, etc 5. Neuropsych: This patient is capable of making decisions on his own behalf. 6. Skin/Wound Care: Routine skin checks 7. Fluids/Electrolytes/Nutrition: Routine I&O's   Mild hyponatremia: 133 on 5/16 (stable)   Will cont to monitor 8. Acute blood loss anemia.   Hb 8.3 on 5/18  Hemoccult stools pending  Will continue to monitor 9. Constipation. Laxative assistance 10. Tobacco abuse. Counseling 11.  Hypoalbuminemia:  Supplement started on 5/17 12. Posterior scalp lipoma  Stable in size, nontender, no warmth, no erythema  Will continue monitor for changes  LOS (Days) 2 A FACE TO FACE EVALUATION WAS PERFORMED  Shakira Los Lorie Phenix 12/18/2015 9:11 AM

## 2015-12-18 NOTE — Progress Notes (Signed)
Physical Therapy Session Note  Patient Details  Name: Marvin Hardy MRN: [id number] Date of Birth: 08/25/1960  Today's Date: 12/18/2015 PT Individual Time: 0900-0959 PT Individual Time Calculation (min): 59 min  Missed Minutes: 75 min due to abdominal pain.   Short Term Goals: Week 1:  PT Short Term Goal 1 (Week 1): short term goals = LTG due to ELOS.   Skilled Therapeutic Interventions/Progress Updates:    [place] Patient received supine in bed with complaints of significant abdominal pain 6/10, but agreeable to PT. Patient performed bed mobility for supine to sit with min A from PT with assistance at LLE due to labored movement from pain. Patient donned pants with mod A from PT due to difficulty reaching to L foot to place foot in pant leg. Sit to stand from bed with mod A. While standing patient was able to zip and button pants with min A for balance. Stand pivot transfer to [facility] with min A from PT and cue for improved weight bearing through BUE to prevent pain in LLE.  WC mobility for 175f x2 with supervision A from PT in cues for improved BUE propulsion and obstacle navigation.  Patient performed stand pivot onto mat table with min A from PT as well as mod cues for improved UE placement with descent onto bed.   Seated therex   hip flexion x 12 RLE, x 8 LLE  LAQ x 12 BLE x 10 LLE BLE calf raises x 15  Patient reports significant pain in LLE with all therex and noted to have decreaed ROM compared to previous session due to pain.   Patient performed gait training with RW for 338fx 3 with min-progressing to supervision A with min-mod cues for proper step to gait pattern and increased weight bearing through BUE to decrease pain in LLE. Patient noted to attempt step-through gait pattern during second bout of gait training with noted increase in L hip with hip extension.   Patient returned to room and performed stand pivot transfer to recliner with supervision A from PT.  Patient left  sitting in recliner with call bell within reach.     Session 2.  PT attempted to see patient for afternoon session twice and patient refused both times due to pain in abdomin as well as LLE at surgical site. Patient educated on the importance of continued movement/exercise and involvement in therapy services, but patient remained stated that he could not handle it today. He also stated that he would do his best to participate tomorrow. Patient left supine in bed with call bell within reach and all other needs met.     Therapy Documentation Precautions:  Precautions Precautions: Fall Restrictions Weight Bearing Restrictions: No LLE Weight Bearing: Weight bearing as tolerated   Pain: Pain Assessment Pain Assessment: 0-10 Pain Score: 6  Pain Location: Ankle Pain Orientation: Mid;Lower Pain Descriptors / Indicators: Aching Pain Onset: Awakened from sleep Patients Stated Pain Goal: 4 Pain Intervention(s): Ambulation/increased activity  See Function Navigator for Current Functional Status.   Therapy/Group: Individual Therapy  AuLorie Phenix/18/2017, 9:59 AM

## 2015-12-18 NOTE — Progress Notes (Signed)
Social Work Patient ID: Marvin Hardy, male   DOB: 10/19/1960, 55 y.o.   MRN: [id number] [name] with [name]-pt's brother to discuss team conference goals and target discharge date 5/27. He will make plans with pt and pt's daughter-Tamika about coming to transport pt To oldest [facility] home in [place]. Pt will need ortho and oncologist MD down there he can follow up with. [name] wanted to know if biopsy results back yet. Will work on coordinating Discharge plans.

## 2015-12-18 NOTE — Patient Care Conference (Signed)
Inpatient RehabilitationTeam Conference and Plan of Care Update Date: 12/17/2015   Time: 2:25 PM    Patient Name: Marvin Hardy      Medical Record Number: [id number]  Date of Birth: [birth year] Sex: Male         Room/Bed: 4W24C/4W24C-01 Payor Info: Payor: [place] / Plan: [employer] OTHER / Product Type: *No Product type* /    Admitting Diagnosis: L FEMUR FX   Admit Date/Time:  12/16/2015  4:37 PM Admission Comments: No comment available   Primary Diagnosis:  <principal problem not specified> Principal Problem: <principal problem not specified>  Patient Active Problem List   Diagnosis Date Noted  . Lipoma   . Hyponatremia   . Acute blood loss anemia   . Hypoalbuminemia due to protein-calorie malnutrition ([place])   . Adenocarcinoma ([place]) 12/16/2015  . Pathological fracture of left femur due to neoplastic disease ([place])   . Effusion of knee   . Fracture   . Mass of soft tissue   . Pain   . Femur fracture ([place]) 12/06/2015  . Femur fracture, left ([place]) 12/06/2015  . Closed fracture of shaft of left femur ([place])   . Swelling of right testicle     Expected Discharge Date: Expected Discharge Date: 12/27/15  Team Members Present: Physician leading conference: Dr. Delice Lesch Social Worker Present: Ovidio Kin, LCSW Nurse Present: Dorien Chihuahua, RN PT Present: Lavone Nian, Rory Percy, PT OT Present: Willeen Cass, OT SLP Present: Windell Moulding, SLP PPS Coordinator present : Daiva Nakayama, RN, CRRN     Current Status/Progress Goal Weekly Team Focus  Medical   Limited functional mobility secondary to pathologic left femur fracture/poorly differentiated adenocarcinoma.  Improve elecrolytes, mobility, transferes  see above   Bowel/Bladder   cont x2 LBM: 5/15/7 (with enema per report)   remain cont   contine with plan of care    Swallow/Nutrition/ Hydration             ADL's   mod A overall for B/D; min A for sit to stands and stand pivot transfers withRW  mod I  overall  activity tolerance, pain management, functional mobility, standing  tolerance d/c planning   Mobility   Mod A with RW over all for transfers at eval and min A with RW for short distances.   Mod I with LRAD for transfers and gait up to household distances.   improved endurance, BLE strength, gait training, increased independence with mobility.    Communication             Safety/Cognition/ Behavioral Observations            Pain   8-10/10 usually. PRN oxy extended release are given   <3  pre-medicate before therapy sessions, and asess and treat PRN and qshfit    Skin   Left hip with surgical dressing- old dried drainage in small spots with staples. No other issues   remain free from infection/ breakdown while on rehab   assess and monitor surgical site      *See Care Plan and progress notes for long and short-term goals.  Barriers to Discharge: Hyponatremia, ABLA, poor endruance, mobility, transfers    Possible Resolutions to Barriers:  Pt edu, follow labs, therapies    Discharge Planning/Teaching Needs:    Brother to come from [place] and transport to sister's home in [place]. Will have intermittent assist. Will need to find ortho and oncologist MD in [place]     Team Discussion:  Goals-mod/i to  supervision level. Endurance and pain issues. Working through his pain and participating in therapies. New eval. Let brother know so can plan his trip here.  Revisions to Treatment Plan:  New eval   Continued Need for Acute Rehabilitation Level of Care: The patient requires daily medical management by a physician with specialized training in physical medicine and rehabilitation for the following conditions: Daily direction of a multidisciplinary physical rehabilitation program to ensure safe treatment while eliciting the highest outcome that is of practical value to the patient.: Yes Daily medical management of patient stability for increased activity during participation in an  intensive rehabilitation regime.: Yes Daily analysis of laboratory values and/or radiology reports with any subsequent need for medication adjustment of medical intervention for : Post surgical problems;Other  Eren Puebla, Gardiner Rhyme 12/19/2015, 8:25 AM

## 2015-12-18 NOTE — Progress Notes (Signed)
Occupational Therapy Session Note  Patient Details  Name: Marvin Hardy MRN: [id number] Date of Birth: [birth year]  Today's Date: 12/18/2015 OT Individual Time: 1015-1100 OT Individual Time Calculation (min): 45 min    Short Term Goals: Week 1:  OT Short Term Goal 1 (Week 1): Pt will bathe at sink, seated and standing supported, with setup assist OT Short Term Goal 2 (Week 1): Pt will demo ability to use AE as needed to extend reach during lower body dressing OT Short Term Goal 3 (Week 1): Pt will demonstrate ability to apply 3 principles of energy conservation during performance of BADL OT Short Term Goal 4 (Week 1): Pt will maintain dynamic standing balance supported at [facility] for 5 min during functional task with supervision.  Skilled Therapeutic Interventions/Progress Updates: Pt received seated in recliner with complaint of severe pain and nausea.   OT noted breakfast tray untouched and bath basin used for nausea (collecting salivatory secretions).   OT educated pt on use of emesis bags and modification of task to enable participation in BADL.  Pt elected to perform bathing at sink seated in recliner and deferred washing lower legs, back or buttocks.   Pt progressed through session with extra time, setup, steadying assist while pt stood to wash periarea and min instructional cues to rinse mouth and reduce edema at scrotum.   Pt denied need for attention to scrotum as recommended (elevation with towel roll) or use of mouth moisturizing gel (provided).    OT alerted RN to  poor appetite and complaint of severe "dry mouth" and nausea.  Pt maintained appropriate and courteous interaction with OT throughout session despite severe pain.     Therapy Documentation Precautions:  Precautions Precautions: Fall Restrictions Weight Bearing Restrictions: No LLE Weight Bearing: Weight bearing as tolerated  Pain: 7/10 pain at abdomin w/nausea.   Pt limits thoroughness with BADL but works through pain.    ADL: ADL ADL Comments: see functional navigator  See Function Navigator for Current Functional Status.   Therapy/Group: Individual Therapy  [place] 12/18/2015, 3:30 PM

## 2015-12-18 NOTE — Progress Notes (Signed)
Occupational Therapy Note  Patient Details  Name: Marvin Hardy MRN: [id number] Date of Birth: [birth year]  Today's Date: 12/18/2015 OT Missed Time: 44 Minutes Missed Time Reason: Pain  Upon entering the room, pt supine in bed and refusing therapy secondary to increased stomach pain. Pt declined therapy and stated, "I just need to lay still right now and maybe it will pass." OT provided education for participation but pt continued to decline. RN notified of pain.    Phineas Semen 12/18/2015, 3:11 PM

## 2015-12-19 ENCOUNTER — Encounter (HOSPITAL_COMMUNITY): Payer: BLUE CROSS/BLUE SHIELD

## 2015-12-19 ENCOUNTER — Inpatient Hospital Stay (HOSPITAL_COMMUNITY): Payer: BLUE CROSS/BLUE SHIELD

## 2015-12-19 ENCOUNTER — Encounter (HOSPITAL_COMMUNITY): Payer: Self-pay

## 2015-12-19 ENCOUNTER — Inpatient Hospital Stay (HOSPITAL_COMMUNITY): Payer: BLUE CROSS/BLUE SHIELD | Admitting: Physical Therapy

## 2015-12-19 DIAGNOSIS — E871 Hypo-osmolality and hyponatremia: Secondary | ICD-10-CM

## 2015-12-19 DIAGNOSIS — F4321 Adjustment disorder with depressed mood: Secondary | ICD-10-CM

## 2015-12-19 MED ORDER — WHITE PETROLATUM GEL
Status: AC
  Administered 2015-12-20: 0.2
  Filled 2015-12-19: qty 1

## 2015-12-19 MED ORDER — SENNOSIDES-DOCUSATE SODIUM 8.6-50 MG PO TABS
1.0000 | ORAL_TABLET | Freq: Two times a day (BID) | ORAL | Status: DC
  Administered 2015-12-19 – 2015-12-26 (×13): 1 via ORAL
  Filled 2015-12-19 (×16): qty 1

## 2015-12-19 MED ORDER — MUSCLE RUB 10-15 % EX CREA
TOPICAL_CREAM | CUTANEOUS | Status: DC | PRN
  Filled 2015-12-19: qty 85

## 2015-12-19 MED ORDER — LIDOCAINE 5 % EX PTCH
2.0000 | MEDICATED_PATCH | CUTANEOUS | Status: DC
Start: 2015-12-19 — End: 2015-12-27
  Administered 2015-12-19 – 2015-12-27 (×7): 2 via TRANSDERMAL
  Filled 2015-12-19 (×8): qty 2

## 2015-12-19 MED ORDER — BISACODYL 10 MG RE SUPP: 10.0000 mg | Freq: Every day | RECTAL | Status: DC | PRN

## 2015-12-19 NOTE — Plan of Care (Signed)
Problem: RH BOWEL ELIMINATION Goal: RH STG MANAGE BOWEL W/MEDICATION W/ASSISTANCE STG Manage Bowel with Medication with Assistance. Min A  Outcome: Progressing Sorbitol given on day shift

## 2015-12-19 NOTE — Progress Notes (Signed)
[place] PHYSICAL MEDICINE & REHABILITATION     PROGRESS NOTE  Subjective/Complaints:  Still some soreness where knot is on left back.   ROS: Denies CP, SOB, N/V/D.  Objective: Vital Signs: Blood pressure 116/64, pulse 65, temperature 98.6 F (37 C), temperature source Oral, resp. rate 16, height '6\' 6"'$  (1.981 m), weight 73.12 kg (161 lb 3.2 oz), SpO2 97 %. No results found.  Recent Labs  12/16/15 1807 12/18/15 0534  WBC 7.6 7.7  HGB 7.5* 8.3*  HCT 22.3* 25.0*  PLT 444* 527*    Recent Labs  12/16/15 1807  NA 133*  K 3.9  CL 96*  GLUCOSE 121*  BUN 17  CREATININE 0.77  CALCIUM 8.2*   CBG (last 3)  No results for input(s): GLUCAP in the last 72 hours.  Wt Readings from Last 3 Encounters:  12/16/15 73.12 kg (161 lb 3.2 oz)  12/06/15 73.211 kg (161 lb 6.4 oz)    Physical Exam:  BP 116/64 mmHg  Pulse 65  Temp(Src) 98.6 F (37 C) (Oral)  Resp 16  Ht '6\' 6"'$  (1.981 m)  Wt 73.12 kg (161 lb 3.2 oz)  BMI 18.63 kg/m2  SpO2 97% Constitutional: He appearscachetic. NAD. Vital signs reviewed. Head: Normocephalic. Atraumatic.  Eyes: EOM and Conj are normal.  Cardiovascular: Normal rate and regular rhythm. no murmurs rubs,  Respiratory:  Decreased breath sounds at the bases but clear to auscultation. Effort normal. No distress GI: Soft. Bowel sounds are normal. He exhibits no distension.  Musc: pain in left hip with PROM of leg. Large nodule left chest wall Neurological: He is alert and oriented. Sensory normal.  Motor: B/l UE: 4+/5 prox, 5/5 distal.  RLE: 4+/5 proximally, 5/5 distally.  LLE: (pain) 2/5 proximally, 4+/5 distally.  Skin:  Incision with dressing with dried drainage.  Left knee effusion Left thigh is swollen and tender no ecchymosis Small nodule posterior scalp without erythema or warmth, nontender, likely lipoma  Assessment/Plan: 1. 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 Physiatrist is providing close team supervision and 24 hour management of active medical problems listed below. Physiatrist and rehab team continue to assess barriers to discharge/monitor patient progress toward functional and medical goals.  Function:  Bathing Bathing position   Position: Other (comment) (recliner at sink)  Bathing parts Body parts bathed by patient: Right arm, Left arm, Chest, Abdomen, Front perineal area, Right upper leg, Left upper leg Body parts bathed by helper: Buttocks, Right lower leg, Left lower leg, Back  Bathing assist Assist Level: Touching or steadying assistance(Pt > 75%)      Upper Body Dressing/Undressing Upper body dressing   What is the patient wearing?: Pull over shirt/dress, Hospital gown     Pull over shirt/dress - Perfomed by patient: Thread/unthread right sleeve, Thread/unthread left sleeve, Put head through opening, Pull shirt over trunk          Upper body assist Assist Level: Set up   Set up : To obtain clothing/put away  Lower Body Dressing/Undressing Lower body dressing   What is the patient wearing?: Underwear, Non-skid slipper socks, Hospital Gown Underwear - Performed by patient: Thread/unthread right underwear leg, Thread/unthread left underwear leg, Pull underwear up/down Underwear - Performed by helper: Pull underwear up/down, Thread/unthread left underwear leg Pants- Performed by patient: Thread/unthread right pants leg, Thread/unthread left pants leg, Fasten/unfasten pants Pants- Performed by helper:  Pull pants up/down       Socks - Performed by helper: Don/doff right sock, Don/doff left sock   Shoes - Performed by helper: Don/doff right shoe, Don/doff left shoe, Fasten right, Fasten left          Lower body assist Assist for lower body dressing: Touching or steadying assistance (Pt > 75%)       Toileting Toileting          Toileting assist     Transfers Chair/bed transfer   Chair/bed transfer method: Stand pivot Chair/bed transfer assist level: Touching or steadying assistance (Pt > 75%) Chair/bed transfer assistive device: Armrests, Medical sales representative     Max distance: 4f Assist level: Supervision or verbal cues   Wheelchair   Type: Manual Max wheelchair distance: 152fAssist Level: Supervision or verbal cues  Cognition Comprehension Comprehension assist level: Follows complex conversation/direction with no assist  Expression Expression assist level: Expresses complex 90% of the time/cues < 10% of the time  Social Interaction Social Interaction assist level: Interacts appropriately with others with medication or extra time (anti-anxiety, antidepressant).  Problem Solving Problem solving assist level: Solves basic 90% of the time/requires cueing < 10% of the time  Memory Memory assist level: More than reasonable amount of time     Medical Problem List and Plan: 1. Limited functional mobility secondary to pathologic left femur fracture/poorly differentiated adenocarcinoma. Status post intramedullary nail placement 12/09/2015.   Weightbearing as tolerated  Continue CIR 2. DVT Prophylaxis/Anticoagulation: SCDs.   Vascular study negative for DVT on 5/17 3. Pain Management: OxyContin sustained release 40 mg every 12 hours, oxycodone and Robaxin as needed.   Monitor with increased mobility, will increase as necessary   -add lidoderm patch to nodule on left chest wall 4. Mood: team to provide ego support. Patient encouraged to be open with team regarding any need for support, counseling, etc 5. Neuropsych: This patient is capable of making decisions on his own behalf. 6. Skin/Wound Care: Routine skin checks 7. Fluids/Electrolytes/Nutrition: encourage po   Mild hyponatremia: 133 on 5/16 (stable)---recheck next week   Will cont to monitor 8. Acute  blood loss anemia.   Hb 8.3 on 5/18  Hemoccult stools pending  Will continue to monitor 9. Constipation. Laxative assistance 10. Tobacco abuse. Counseling 11. Hypoalbuminemia:  Supplement started on 5/17 12. Posterior scalp lipoma  Stable in size, nontender, no warmth, no erythema  Will continue monitor for changes  LOS (Days) 3 A FACE TO FACE EVALUATION WAS PERFORMED  SWARTZ,ZACHARY T 12/19/2015 9:52 AM

## 2015-12-19 NOTE — Progress Notes (Signed)
Occupational Therapy Session Note  Patient Details  Name: Marvin Hardy MRN: [id number] Date of Birth: 11/17/1960  Today's Date: 12/19/2015 OT Individual Time: 1045-1200 OT Individual Time Calculation (min): 75 min    Short Term Goals: Week 1:  OT Short Term Goal 1 (Week 1): Pt will bathe at sink, seated and standing supported, with setup assist OT Short Term Goal 2 (Week 1): Pt will demo ability to use AE as needed to extend reach during lower body dressing OT Short Term Goal 3 (Week 1): Pt will demonstrate ability to apply 3 principles of energy conservation during performance of BADL OT Short Term Goal 4 (Week 1): Pt will maintain dynamic standing balance supported at [facility] for 5 min during functional task with supervision.  Skilled Therapeutic Interventions/Progress Updates: ADL-retraining (30 min) with focus on bed mobility, transfers, AE training to extend reach and assist with management of LLE (using leg lifter).   Pt received supine in bed and complaining of residual pain from prior therapy session.   OT noted breakfast tray untouched and reiterated need for improved intake.   Pt agreed to rise from bed and attempt self-feeding if food was re-heated.   OT setup as requested and pt completed bed mobility and transfer to recliner with overall Min assist.   Pt declined bathing/dressing stating he was assisted already by RN tech but agreed to instruction on use of AE to improve his independence   OT demo'd use of wide sock aid and reacher and pt donned both non-skid socks using sock aid as instructed.   To provide distraction from pain, OT presented Nintendo Wii training as beneficial to distract pt from LE pain and reinforce goals of improved mobility and balance.   Pt completed 1 game with OT (bowling) and progressed to independent participation playing 3-point contest (basketball).   Pt was unable to stand during this task but verbalized understanding of benefit and stated he would continue to  progress with standing, as feasible.   Pt left in recliner at end of session awaiting his lunch.   PA advised of pt's pain with OT recommendation to provide muscle rub to reinforce self-management of pain.     Therapy Documentation Precautions:  Precautions Precautions: Fall Restrictions Weight Bearing Restrictions: No LLE Weight Bearing: Weight bearing as tolerated  Pain: Pain Assessment Pain Assessment: 0-10 Pain Score: 10-Worst pain ever Pain Type: Acute pain Pain Location: Leg Pain Orientation: Left;Upper Pain Radiating Towards: lower leg Pain Frequency: Intermittent Pain Onset: With Activity Patients Stated Pain Goal: 3 Pain Intervention(s): Repositioned;MD notified (Comment);Distraction Multiple Pain Sites: No  ADL: ADL ADL Comments: see functional navigator  See Function Navigator for Current Functional Status.   Therapy/Group: Individual Therapy  [place] 12/19/2015, 1:09 PM

## 2015-12-19 NOTE — Progress Notes (Signed)
Physical Therapy Session Note  Patient Details  Name: Marvin Hardy MRN: [id number] Date of Birth: 1961/05/13  Today's Date: 12/19/2015 PT Individual Time: 6438-3818 AND 4037-5436 PT Individual Time Calculation (min): 75 min AND 40 minutes.   Short Term Goals: Week 1:  PT Short Term Goal 1 (Week 1): short term goals = LTG due to ELOS.   Skilled Therapeutic Interventions/Progress Updates:    Patient received sitting up in recliner, and agreeable to PT. Patient reports that stomach pain has dissipated due to pain medication from RN.  Patient performed stand pivot transfer with WC with RW and min A from PT and min cues for AD management and improve posture. WC mobility in hall for 158f with supervision A from PT and min cues for improved BUE propulsion technique to maintain straight trajectory.  Enduracne and LLE ROM therex on nustep for 8 minutes on level 2 with min cues from PT for improved LLE ROM and increased push through LLE as tolerated.    Supine therex:  clockwiseAnkle rotations, x 20 BLE  SAQ, x 12 RLE, x 8 LLE Heel slides x 12 RLE, x 8 LLE with pillow case on foot to reduce friction Hip abduciton x 12 RLE, x 6 LLE AAROM with maxislide to reduce friction   Quad set x 10 BLE  Patient required significant increase in time for set up and mod max cueing and encouragement  for proper technique due to pain in entire LLE.   Patient returned to room and performed squat pivot transfer to bed with min A. Sit>supine transfer performed with mod A from PT. Patient left supine in bed with call ball within reach.   Session 2 Patient received supine in bed and agreeable to PT. Patient performed supine>sit transfer with mod A to move LLE. Patient performed stand pivot transfer to RW with min A from PT and min cues for AD management.   Patient performed WC mobility for 150 with supervision A and cues for improved control through doorways.  Gait training for 325fx 2 with RW and min A from PT. PT  provided mod verbal and visual instruction for improved step to gait pattern to decrease circumduction of the LLE with swing through. Patient noted to have decreased LLE knee flexion with swing compared to previous treatment, and unable to correct due to pain in thigh.   Patient returned to room and performed stand pivot transfer to bed with RW. Sit>supine transfer into bed with min A from PT and leg lifter on the LLE. Patient left supine in bed with call bell within reach.    Therapy Documentation Precautions:  Precautions Precautions: Fall Restrictions Weight Bearing Restrictions: No LLE Weight Bearing: Weight bearing as tolerated   Pain: Pain Assessment Pain Assessment: 0-10 Pain Score: 0-No pain  See Function Navigator for Current Functional Status.   Therapy/Group: Individual Therapy  AuLorie Phenix/19/2017, 10:01 AM

## 2015-12-20 ENCOUNTER — Inpatient Hospital Stay (HOSPITAL_COMMUNITY): Payer: BLUE CROSS/BLUE SHIELD | Admitting: Physical Therapy

## 2015-12-20 DIAGNOSIS — M84552G Pathological fracture in neoplastic disease, left femur, subsequent encounter for fracture with delayed healing: Secondary | ICD-10-CM

## 2015-12-20 NOTE — Progress Notes (Signed)
Marvin Hardy is a 55 y.o. male 22-Aug-1960 [id number]  Subjective: Hip pain w/ROM. No new problems. Slept well. Feeling OK.  Objective: Vital signs in last 24 hours: Temp:  [98.2 F (36.8 C)-99.3 F (37.4 C)] 98.2 F (36.8 C) (05/20 0542) Pulse Rate:  [70] 70 (05/20 0542) Resp:  [18-20] 18 (05/20 0542) BP: (112-114)/(59-61) 112/59 mmHg (05/20 0542) SpO2:  [98 %-99 %] 99 % (05/20 0542) Weight change:  Last BM Date: 12/15/15  Intake/Output from previous day: 05/19 0701 - 05/20 0700 In: 720 [P.O.:720] Out: 1600 [Urine:1600] Last cbgs: CBG (last 3)  No results for input(s): GLUCAP in the last 72 hours.   Physical Exam General: No apparent distress   HEENT: not dry Lungs: Normal effort. Lungs clear to auscultation, no crackles or wheezes. Cardiovascular: Regular rate and rhythm, no edema Abdomen: S/NT/ND; BS(+) Musculoskeletal:  unchanged Neurological: No new neurological deficits Wounds: clear  Skin: clear   Mental state: Alert, oriented, cooperative    Lab Results: BMET    Component Value Date/Time   NA 133* 12/16/2015 1807   K 3.9 12/16/2015 1807   CL 96* 12/16/2015 1807   CO2 29 12/16/2015 1807   GLUCOSE 121* 12/16/2015 1807   BUN 17 12/16/2015 1807   CREATININE 0.77 12/16/2015 1807   CALCIUM 8.2* 12/16/2015 1807   GFRNONAA >60 12/16/2015 1807   GFRAA >60 12/16/2015 1807   CBC    Component Value Date/Time   WBC 7.7 12/18/2015 0534   RBC 2.54* 12/18/2015 0534   RBC 3.45* 12/07/2015 0319   HGB 8.3* 12/18/2015 0534   HCT 25.0* 12/18/2015 0534   PLT 527* 12/18/2015 0534   MCV 98.4 12/18/2015 0534   MCH 32.7 12/18/2015 0534   MCHC 33.2 12/18/2015 0534   RDW 13.2 12/18/2015 0534   LYMPHSABS 1.7 12/18/2015 0534   MONOABS 1.0 12/18/2015 0534   EOSABS 0.0 12/18/2015 0534   BASOSABS 0.0 12/18/2015 0534    Studies/Results: No results found.  Medications: I have reviewed the patient's current medications.  Assessment/Plan:  1. Limited functional  mobility secondary to pathologic left femur fracture/poorly differentiated adenocarcinoma. Status post intramedullary nail placement 12/09/2015.  Weightbearing as tolerated Continue CIR 2. DVT Prophylaxis/Anticoagulation: SCDs.  Vascular study negative for DVT on 5/17 3. Pain Management: OxyContin sustained release 40 mg every 12 hours, oxycodone and Robaxin as needed.  Monitor with increased mobility, will increase as necessary  -add lidoderm patch to nodule on left chest wall 4. Mood: team to provide ego support. Patient encouraged to be open with team regarding any need for support, counseling, etc 5. Neuropsych: This patient is capable of making decisions on his own behalf. 6. Skin/Wound Care: Routine skin checks 7. Fluids/Electrolytes/Nutrition: encourage po  Mild hyponatremia - Will cont to monitor 8. Acute blood loss anemia.  Hemoccult stools pending Will continue to monitor 9. Constipation. Laxative assistance 10. Tobacco abuse. Counseling 11. Hypoalbuminemia: Supplement started on 5/17 12. Posterior scalp lipoma Stable in size, nontender, no warmth, no erythema Will continue monitor for changes    Length of stay, days: 4  Walker Kehr , MD 12/20/2015, 9:24 AM

## 2015-12-20 NOTE — Progress Notes (Signed)
Patient able to move independently in bed but refuses to reposition due to pain.  Explained to patient the risk of pressure ulcers and offered pain medication; patient verbalizes understanding.  Will continue to monitor.

## 2015-12-21 ENCOUNTER — Inpatient Hospital Stay (HOSPITAL_COMMUNITY): Payer: BLUE CROSS/BLUE SHIELD

## 2015-12-21 NOTE — Progress Notes (Signed)
Occupational Therapy Session Note  Patient Details  Name: Marvin Hardy MRN: [id number] Date of Birth: August 01, 1961  Today's Date: 12/21/2015 OT Individual Time: 0800-0900 OT Individual Time Calculation (min): 60 min    Short Term Goals: Week 1:  OT Short Term Goal 1 (Week 1): Pt will bathe at sink, seated and standing supported, with setup assist OT Short Term Goal 2 (Week 1): Pt will demo ability to use AE as needed to extend reach during lower body dressing OT Short Term Goal 3 (Week 1): Pt will demonstrate ability to apply 3 principles of energy conservation during performance of BADL OT Short Term Goal 4 (Week 1): Pt will maintain dynamic standing balance supported at [facility] for 5 min during functional task with supervision.  Skilled Therapeutic Interventions/Progress Updates:    Pt resting in bed upon arrival eating breakfast.  Pt engaged in BADL retraining including bathing and dressing with sit<>stand from recliner.  Pt performed scoot/squat pivot transfer bed>recliner with close supervision.  Pt utilized leg lifter to assist with moving his LLE off edge of bed in preparation for transfer.  Pt required steady A when standing at sink to bathe buttocks and pull up pants.  Pt required more than a reasonable amount of time to complete all tasks.  Educated pt on energy conservation strategies.  Pt remained in recliner with all needs within reach.   Therapy Documentation Precautions:  Precautions Precautions: Fall Restrictions Weight Bearing Restrictions: No LLE Weight Bearing: Weight bearing as tolerated Pain:  Pt grimaced when moving his LLE and repositioning to complete BADLs; repositioned and emotional support provided ADL: ADL ADL Comments: see functional navigator  See Function Navigator for Current Functional Status.   Therapy/Group: Individual Therapy  Leroy Libman 12/21/2015, 9:04 AM

## 2015-12-21 NOTE — Progress Notes (Signed)
Physical Therapy Session Note  Patient Details  Name: Marvin Hardy MRN: [id number] Date of Birth: 03/19/1961  Today's Date: 12/21/2015 PT Individual Time: 1638-4665 PT Individual Time Calculation (min): 60 min   Short Term Goals: Week 1:  PT Short Term Goal 1 (Week 1): short term goals = LTG due to ELOS.   Skilled Therapeutic Interventions/Progress Updates:   Session focused on functional transfers with RW, gait training through obstacle course for home and community mobility training, w/c propulsion for strengthening and endurance, and overall education on importance of LLE ROM and movement. Discussed home set-up and support available at home. Pt required extra time for all tasks due to pain. End of session returned back to bed to rest before next therapy session.   Therapy Documentation Precautions:  Precautions Precautions: Fall Restrictions Weight Bearing Restrictions: No LLE Weight Bearing: Weight bearing as tolerated Pain: 10/10 pain in LLE during session - RN notified for pain medication.   See Function Navigator for Current Functional Status.   Therapy/Group: Individual Therapy  Canary Brim Ivory Broad, PT, DPT  12/21/2015, 10:57 AM

## 2015-12-21 NOTE — Progress Notes (Addendum)
Occupational Therapy Note  Patient Details  Name: Marvin Hardy MRN: [id number] Date of Birth: April 14, 1961  Today's Date: 12/21/2015 OT Individual Time: 1300-1345 OT Individual Time Calculation (min): 45 min   Pt c/o 9/10 pain in LLE; RN aware, repositioned, emotional support Individual therapy  Pt asleep in bed upon arrival with lunch on table beside bed.  Pt easily aroused and stated that he didn't have any appetite.  Pt c/o increased pain in LLE after morning therapies.  Pt agreeable to engaging in BUE therex at bed level.  Pt completed chest presses, PNF diagonals, shoulder flexion exercises, and overhead presses with 2/2 kg weighted ball.  Pt also completed biceps curls with 5# dumb bell.  Pt remained in bed with all needs within reach.  Discussed importance of nutrition with pt.  Pt verbalized understanding and stated that when he didn't have an appetite anything he eats "comes right back up."  Leroy Libman 12/21/2015, 1:49 PM

## 2015-12-21 NOTE — Progress Notes (Signed)
Physical Therapy Session Note  Patient Details  Name: Marvin Hardy MRN: [id number] Date of Birth: [birth year]  Today's Date: 12/21/2015 PT Individual Time: 6712-4580 PT Individual Time Calculation (min): 25 min   Short Term Goals: Week 1:  PT Short Term Goal 1 (Week 1): short term goals = LTG due to ELOS.   Skilled Therapeutic Interventions/Progress Updates:   Pt reporting still in a lot of pain but willing to try to get up OOB. Pt came to EOB with extra time and steadying assist for LLE management but as he continued scooting on EOB, pt unable to tolerate any further and needed to return to laying position. He became very tearful and emotional support provided. Pt engaged in some supine LE therex for last few min to tolerance (SLR and quad sets) but ultimately unable to complete despite multiple attempts. Ended session a few min early and pt set up with all needs in reach.   Therapy Documentation Precautions:  Precautions Precautions: Fall Restrictions Weight Bearing Restrictions: No LLE Weight Bearing: Weight bearing as tolerated General: PT Amount of Missed Time (min): 5 Minutes PT Missed Treatment Reason: Pain  Pain: 10/10 pain with attempted mobility in L thigh and stomach - pt reporting being nauseous also.     See Function Navigator for Current Functional Status.   Therapy/Group: Individual Therapy  Marvin Hardy, PT, DPT  12/21/2015, 3:31 PM

## 2015-12-21 NOTE — Progress Notes (Signed)
Marvin Hardy is a 55 y.o. male 17-Oct-1960 [id number]  Subjective: C/o hip pain w/ROM. No new problems. Slept ok  Objective: Vital signs in last 24 hours: Temp:  [98.3 F (36.8 C)-98.8 F (37.1 C)] 98.3 F (36.8 C) (05/21 0558) Pulse Rate:  [72-79] 79 (05/21 0558) Resp:  [18] 18 (05/21 0558) BP: (112-117)/(57-66) 117/66 mmHg (05/21 0558) SpO2:  [99 %] 99 % (05/21 0558) Weight change:  Last BM Date: 12/20/15  Intake/Output from previous day: 05/20 0701 - 05/21 0700 In: 480 [P.O.:480] Out: 250 [Urine:250] Last cbgs: CBG (last 3)  No results for input(s): GLUCAP in the last 72 hours.   Physical Exam General: NAD HEENT: not dry Lungs: Normal effort. Lungs clear to auscultation, no crackles or wheezes. Cardiovascular: Regular rate and rhythm, no edema Abdomen: S/NT/ND; BS(+) Musculoskeletal:  unchanged Neurological: No new neurological deficits Wounds: clear  Skin: clear   Mental state: Alert, oriented, cooperative    Lab Results: BMET    Component Value Date/Time   NA 133* 12/16/2015 1807   K 3.9 12/16/2015 1807   CL 96* 12/16/2015 1807   CO2 29 12/16/2015 1807   GLUCOSE 121* 12/16/2015 1807   BUN 17 12/16/2015 1807   CREATININE 0.77 12/16/2015 1807   CALCIUM 8.2* 12/16/2015 1807   GFRNONAA >60 12/16/2015 1807   GFRAA >60 12/16/2015 1807   CBC    Component Value Date/Time   WBC 7.7 12/18/2015 0534   RBC 2.54* 12/18/2015 0534   RBC 3.45* 12/07/2015 0319   HGB 8.3* 12/18/2015 0534   HCT 25.0* 12/18/2015 0534   PLT 527* 12/18/2015 0534   MCV 98.4 12/18/2015 0534   MCH 32.7 12/18/2015 0534   MCHC 33.2 12/18/2015 0534   RDW 13.2 12/18/2015 0534   LYMPHSABS 1.7 12/18/2015 0534   MONOABS 1.0 12/18/2015 0534   EOSABS 0.0 12/18/2015 0534   BASOSABS 0.0 12/18/2015 0534    Studies/Results: No results found.  Medications: I have reviewed the patient's current medications.  Assessment/Plan:  1. Limited functional mobility secondary to pathologic left  femur fracture/poorly differentiated adenocarcinoma. Status post intramedullary nail placement 12/09/2015.  Weightbearing as tolerated Continue CIR 2. DVT Prophylaxis/Anticoagulation: SCDs.  Vascular study negative for DVT on 5/17 3. Pain Management: OxyContin sustained release 40 mg every 12 hours, oxycodone and Robaxin as needed.  Monitor with increased mobility, will increase as necessary  -add lidoderm patch to nodule on left chest wall 4. Mood: team to provide ego support. Patient encouraged to be open with team regarding any need for support, counseling, etc 5. Neuropsych: This patient is capable of making decisions on his own behalf. 6. Skin/Wound Care: Routine skin checks 7. Fluids/Electrolytes/Nutrition: encourage po  Mild hyponatremia - Will cont to monitor 8. Acute blood loss anemia.  Hemoccult stools pending Will continue to monitor 9. Constipation. Laxative assistance 10. Tobacco abuse. Counseling 11. Hypoalbuminemia: Supplement started on 5/17 12. Posterior scalp lipoma Stable in size, nontender, no warmth, no erythema Will continue monitor for changes  Cont current Rx  Length of stay, days: 5  Walker Kehr , MD 12/21/2015, 9:31 AM

## 2015-12-22 ENCOUNTER — Inpatient Hospital Stay (HOSPITAL_COMMUNITY): Payer: BLUE CROSS/BLUE SHIELD

## 2015-12-22 ENCOUNTER — Inpatient Hospital Stay (HOSPITAL_COMMUNITY): Payer: BLUE CROSS/BLUE SHIELD | Admitting: Occupational Therapy

## 2015-12-22 ENCOUNTER — Inpatient Hospital Stay (HOSPITAL_COMMUNITY): Payer: BLUE CROSS/BLUE SHIELD | Admitting: Physical Therapy

## 2015-12-22 NOTE — Progress Notes (Addendum)
[place] PHYSICAL MEDICINE & REHABILITATION     PROGRESS NOTE  Subjective/Complaints:  Patient lying in bed this morning. He has questions about the results of his biopsy. Patient asked me to speak to his brother, which I did.  ROS: + TTP left chest wall mass. Denies CP, SOB, N/V/D.  Objective: Vital Signs: Blood pressure 109/67, pulse 68, temperature 98.9 F (37.2 C), temperature source Oral, resp. rate 18, height '6\' 6"'$  (1.981 m), weight 73.12 kg (161 lb 3.2 oz), SpO2 97 %. No results found. No results for input(s): WBC, HGB, HCT, PLT in the last 72 hours. No results for input(s): NA, K, CL, GLUCOSE, BUN, CREATININE, CALCIUM in the last 72 hours.  Invalid input(s): CO CBG (last 3)  No results for input(s): GLUCAP in the last 72 hours.  Wt Readings from Last 3 Encounters:  12/16/15 73.12 kg (161 lb 3.2 oz)  12/06/15 73.211 kg (161 lb 6.4 oz)    Physical Exam:  BP 109/67 mmHg  Pulse 68  Temp(Src) 98.9 F (37.2 C) (Oral)  Resp 18  Ht '6\' 6"'$  (1.981 m)  Wt 73.12 kg (161 lb 3.2 oz)  BMI 18.63 kg/m2  SpO2 97% Constitutional: He appears cachetic. NAD. Vital signs reviewed. Head: Normocephalic. Atraumatic.  Eyes: EOM and Conj are normal.  Cardiovascular: Normal rate and regular rhythm. no murmurs rubs,  Respiratory:  Effort normal. No distress.  GI: Soft. Bowel sounds are normal. He exhibits no distension.  Musc: pain in left hip with PROM of leg. Large nodule left chest wall Neurological: He is alert and oriented.   Motor: B/l UE: 4+/5 prox, 5/5 distal.  RLE: 4+/5 proximally, 5/5 distally.  LLE: (pain) 2/5 proximally, 5/5 distally.  Skin:  Incision with staples C/D/I.  Left thigh is swollen and tender no ecchymosis Small nodule posterior scalp without erythema or warmth, nontender, likely lipoma  Assessment/Plan: 1. 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, s/p IM nail placement which require 3+ hours per  day of interdisciplinary therapy in a comprehensive inpatient rehab setting. Physiatrist is providing close team supervision and 24 hour management of active medical problems listed below. Physiatrist and rehab team continue to assess barriers to discharge/monitor patient progress toward functional and medical goals.  Function:  Bathing Bathing position   Position: Other (comment) (recliner at sink)  Bathing parts Body parts bathed by patient: Right arm, Left arm, Chest, Abdomen, Front perineal area, Right upper leg, Left upper leg, Buttocks, Right lower leg Body parts bathed by helper: Left lower leg  Bathing assist Assist Level: Touching or steadying assistance(Pt > 75%)      Upper Body Dressing/Undressing Upper body dressing   What is the patient wearing?: Pull over shirt/dress     Pull over shirt/dress - Perfomed by patient: Thread/unthread right sleeve, Thread/unthread left sleeve, Put head through opening, Pull shirt over trunk          Upper body assist Assist Level: Set up   Set up : To obtain clothing/put away  Lower Body Dressing/Undressing Lower body dressing   What is the patient wearing?: Pants Underwear - Performed by patient: Thread/unthread right underwear leg, Thread/unthread left underwear leg, Pull underwear up/down Underwear - Performed by helper: Pull underwear up/down, Thread/unthread left underwear leg Pants- Performed by patient: Thread/unthread right pants leg, Fasten/unfasten pants, Pull pants up/down Pants- Performed by helper: Thread/unthread left pants leg Non-skid slipper socks- Performed by patient: Don/doff right sock, Don/doff left sock     Socks - Performed  by helper: Don/doff right sock, Don/doff left sock   Shoes - Performed by helper: Don/doff right shoe, Don/doff left shoe, Fasten right, Fasten left          Lower body assist Assist for lower body dressing: Touching or steadying assistance (Pt > 75%)      Toileting Toileting  Toileting activity did not occur: No continent bowel/bladder event        Toileting assist     Transfers Chair/bed transfer   Chair/bed transfer method: Stand pivot, Ambulatory Chair/bed transfer assist level: Touching or steadying assistance (Pt > 75%) Chair/bed transfer assistive device: Walker, Air cabin crew     Max distance: 50' Assist level: Supervision or verbal cues   Wheelchair   Type: Manual Max wheelchair distance: 200' Assist Level: Supervision or verbal cues  Cognition Comprehension Comprehension assist level: Follows complex conversation/direction with no assist  Expression Expression assist level: Expresses complex 90% of the time/cues < 10% of the time  Social Interaction Social Interaction assist level: Interacts appropriately 90% of the time - Needs monitoring or encouragement for participation or interaction.  Problem Solving Problem solving assist level: Solves basic 90% of the time/requires cueing < 10% of the time  Memory Memory assist level: More than reasonable amount of time     Medical Problem List and Plan: 1. Limited functional mobility secondary to pathologic left femur fracture/poorly differentiated adenocarcinoma. Status post intramedullary nail placement 12/09/2015.   Weightbearing as tolerated  Continue CIR  No recorded results to date in EHR for biopsy results for adenocarcinoma 2. DVT Prophylaxis/Anticoagulation: SCDs.   Vascular study negative for DVT on 5/17 3. Pain Management: OxyContin sustained release 40 mg every 12 hours, oxycodone and Robaxin as needed.   Monitor with increased mobility, will increase as necessary   -add lidoderm patch to nodule on left chest wall 4. Mood: team to provide ego support. Patient encouraged to be open with team regarding any need for support, counseling, etc 5. Neuropsych: This patient is capable of making decisions on his own behalf. 6. Skin/Wound Care: Routine skin checks 7.  Fluids/Electrolytes/Nutrition: encourage po   Mild hyponatremia: 133 on 5/16 (stable)   Will cont to monitor 8. Acute blood loss anemia.   Hb 8.3 on 5/18  Hemoccult stools Remain pending  Will continue to monitor 9. Constipation. Laxative assistance 10. Tobacco abuse. Counseling 11. Hypoalbuminemia:  Supplement started on 5/17 12. Posterior scalp lipoma  Stable in size, nontender, no warmth, no erythema  Will continue monitor for changes  LOS (Days) 6 A FACE TO FACE EVALUATION WAS PERFORMED  Harrel Ferrone Lorie Phenix 12/22/2015 10:42 AM

## 2015-12-22 NOTE — Progress Notes (Signed)
Occupational Therapy Session Note  Patient Details  Name: Marvin Hardy MRN: [id number] Date of Birth: 1960/11/03  Today's Date: 12/22/2015 OT Individual Time: 4585-9292 OT Individual Time Calculation (min): 60 min    Short Term Goals: Week 1:  OT Short Term Goal 1 (Week 1): Pt will bathe at sink, seated and standing supported, with setup assist OT Short Term Goal 2 (Week 1): Pt will demo ability to use AE as needed to extend reach during lower body dressing OT Short Term Goal 3 (Week 1): Pt will demonstrate ability to apply 3 principles of energy conservation during performance of BADL OT Short Term Goal 4 (Week 1): Pt will maintain dynamic standing balance supported at [facility] for 5 min during functional task with supervision.  Skilled Therapeutic Interventions/Progress Updates:    Treatment session with focus on BUE strengthening and use of AE to complete LB dressing.  Pt in bed upon arrival, reporting increased pain in LLE from yesterday's therapies and not wanting to get OOB.  Pt willing to complete LB dressing at bed level and willing to participate in BUE strengthening to assist with bed mobility and eventual OOB mobility.  Pt reports wife assisting with bathing this AM.  Donned pants with setup assist and use of reacher, requiring assist to fully thread pant leg over Lt foot.  Educated on donning Lt side first to increase success.  Completed chest presses and PNF diagonals with 4.5# medicine ball 2 sets of 12 each.  Pt with c/o limited ROM and tightness in bilateral shoulders effecting ability to roll in bed.  Provided orange theraband and educated on various exercises to complete with pt able to return demonstration of horizontal abduction, shoulder abduction, and shoulder flexion exercises.  Discussed d/c plan of going to [place] to stay with family for short period of time.  Therapy Documentation Precautions:  Precautions Precautions: Fall Restrictions Weight Bearing Restrictions: No LLE  Weight Bearing: Weight bearing as tolerated General:   Vital Signs: Therapy Vitals Temp: 98.9 F (37.2 C) Temp Source: Oral Pulse Rate: 68 Resp: 18 BP: 109/67 mmHg Patient Position (if appropriate): Lying Oxygen Therapy SpO2: 97 % O2 Device: Not Delivered Pain: Pain Assessment Pain Assessment: 0-10 Pain Score: 8  Pain Type: Acute pain Pain Location: Leg Pain Orientation: Left Pain Descriptors / Indicators: Aching;Sore;Sharp;Tender Pain Frequency: Constant Pain Onset: On-going Pain Intervention(s): RN made aware;Repositioned Multiple Pain Sites: No  See Function Navigator for Current Functional Status.   Therapy/Group: Individual Therapy  Simonne Come 12/22/2015, 9:42 AM

## 2015-12-22 NOTE — Progress Notes (Signed)
Reports decreased appetite. Increased complaint of pain to nodule to left flank/back, appears larger. Nodule tender to touch.  Dressings to Incisions on LLE, C,D, I. Elevating left heel off bed with pillow.  Taking PRN Oxy IR and scheduled Oxy CR for pain. Requests robaxin intermittently. Flat affect, but more engaging. Patrici Ranks A

## 2015-12-22 NOTE — Progress Notes (Signed)
Physical Therapy Session Note  Patient Details  Name: Marvin Hardy MRN: [id number] Date of Birth: 1960-08-06  Today's Date: 12/22/2015 PT Individual Time: 1333-1500 PT Individual Time Calculation (min): 87 min   Short Term Goals: Week 1:  PT Short Term Goal 1 (Week 1): short term goals = LTG due to ELOS.   Skilled Therapeutic Interventions/Progress Updates:    Pt received in recliner & agreeable to PT, noting 9/10 LLE pain. Therapist notified RN of pt's c/o pain & pt received pain medication during session. Discussed goals & d/c plan with pt, as well as pt's limited ability to ambulate while in CIR. Pt reports he will not be able to use w/c in sister's apartment & will have to ambulate. Discussed d/c at w/c level with community mobility & RW for in home use& pt agreeable. Educated pt on need to participate in gait training while in CIR to increase safety with activity. Pt transferred sit<>stand with supervision & ambulated 5 ft with RW recliner>w/c. Pt required use of leg lifter to lift LLE on leg rest. Pt self propelled w/c >1,000 ft with supervision fading to mod I, off of unit & down to new main entrance to hospital. Pt able to propel over even & uneven surfaces with mod i. Once pt returned to unit pt agreeable to gait training 35 x ft with RW & supervision A but pt with minimal L knee flexion & foot clearance. Pt returned to bed & required min A for LLE for sit>supine. At end of session pt left in bed with all needs within reach.  Therapy Documentation Precautions:  Precautions Precautions: Fall Restrictions Weight Bearing Restrictions: No LLE Weight Bearing: Weight bearing as tolerated  Pain: Pain Assessment Pain Assessment: 0-10 Pain Score: 9  Pain Location: Leg Pain Orientation: Left Pain Descriptors / Indicators:  (pulling) Pain Intervention(s): RN made aware  See Function Navigator for Current Functional Status.   Therapy/Group: Individual Therapy  Waunita Schooner 12/22/2015, 1:39 PM

## 2015-12-22 NOTE — Plan of Care (Signed)
Problem: RH PAIN MANAGEMENT Goal: RH STG PAIN MANAGED AT OR BELOW PT'S PAIN GOAL <4  Outcome: Not Progressing Consistently rates pain 9-10

## 2015-12-22 NOTE — Progress Notes (Signed)
Occupational Therapy Session Note  Patient Details  Name: Marvin Hardy MRN: [id number] Date of Birth: [birth year]  Today's Date: 12/22/2015 OT Individual Time: 0930-1030 OT Individual Time Calculation (min): 60 min    Short Term Goals: Week 1:  OT Short Term Goal 1 (Week 1): Pt will bathe at sink, seated and standing supported, with setup assist OT Short Term Goal 2 (Week 1): Pt will demo ability to use AE as needed to extend reach during lower body dressing OT Short Term Goal 3 (Week 1): Pt will demonstrate ability to apply 3 principles of energy conservation during performance of BADL OT Short Term Goal 4 (Week 1): Pt will maintain dynamic standing balance supported at [facility] for 5 min during functional task with supervision.  Skilled Therapeutic Interventions/Progress updates: ADL-retraining with focus on bed mobility, transfers, w/c management, seated grooming and AE re-ed.   Pt received with spouse assisting with shaving pt while he remained in bed, HOB.   Shaving suspended with OT present to assist with bed mobility and transfer to w/c.   Pt able to use leg lifter and manage leg unassisted with extra time to rise to sit at EOB in prep for transfer to w/c.   Pt performed stand pivot transfer, WB through RLE and reaching to armests of w/c to assist with press up to standing prior to pivoting to transfer.   Pt refused use of RW but demo's a transfer ttechnique that is unusual but effective and minimizes WB through LLE which remains painful.   Pt required setup to place elevating leg rest (defective leg rest repaired during session).   Pt doffed sock using reacher and donned clean sock using sock aid as instructed.   Pt then finished grooming at sink and participated in 15 min of seated activity (Nintendo Wii) to promote improved w/c management/positioning and activity tolerance while providing distraction from pain, which was aggravated by increased mobility.     Therapy Documentation Precautions:   Precautions Precautions: Fall Restrictions Weight Bearing Restrictions: No LLE Weight Bearing: Weight bearing as tolerated   Pain: Pain Assessment Pain Assessment: 0-10 Pain Score: 8  Pain Type: Acute pain Pain Location: Leg Pain Orientation: Left Pain Descriptors / Indicators: Aching;Sore;Sharp;Tender Pain Onset: On-going Pain Intervention(s): RN made aware;Repositioned Multiple Pain Sites: No   ADL: ADL ADL Comments: see functional navigator  See Function Navigator for Current Functional Status.   Therapy/Group: Individual Therapy  Todd 12/22/2015, 12:51 PM

## 2015-12-22 NOTE — Plan of Care (Signed)
Problem: RH Ambulation Goal: LTG Patient will ambulate in controlled environment (PT) LTG: Patient will ambulate in a controlled environment, # of feet with assistance (PT).  Downgrade 2/2 limited progress Goal: LTG Patient will ambulate in home environment (PT) LTG: Patient will ambulate in home environment, # of feet with assistance (PT).  Downgrade 2/2 limited progress Goal: LTG Patient will ambulate in community environment (PT) LTG: Patient will ambulate in community environment, # of feet with assistance (PT).  Outcome: Not Applicable Date Met:  19/37/90 Pt to use w/c at community level

## 2015-12-23 ENCOUNTER — Inpatient Hospital Stay (HOSPITAL_COMMUNITY): Payer: BLUE CROSS/BLUE SHIELD | Admitting: Physical Therapy

## 2015-12-23 ENCOUNTER — Inpatient Hospital Stay (HOSPITAL_COMMUNITY): Payer: BLUE CROSS/BLUE SHIELD | Admitting: *Deleted

## 2015-12-23 ENCOUNTER — Inpatient Hospital Stay (HOSPITAL_COMMUNITY): Payer: BLUE CROSS/BLUE SHIELD | Admitting: Occupational Therapy

## 2015-12-23 ENCOUNTER — Inpatient Hospital Stay (HOSPITAL_COMMUNITY): Payer: BLUE CROSS/BLUE SHIELD

## 2015-12-23 DIAGNOSIS — R1084 Generalized abdominal pain: Secondary | ICD-10-CM

## 2015-12-23 LAB — BASIC METABOLIC PANEL
Anion gap: 11 (ref 5–15)
BUN: 16 mg/dL (ref 6–20)
CHLORIDE: 89 mmol/L — AB (ref 101–111)
CO2: 27 mmol/L (ref 22–32)
CREATININE: 0.89 mg/dL (ref 0.61–1.24)
Calcium: 9.7 mg/dL (ref 8.9–10.3)
GFR calc Af Amer: >60 mL/min (ref >60)
GFR calc non Af Amer: >60 mL/min (ref >60)
GLUCOSE: 109 mg/dL — AB (ref 65–99)
POTASSIUM: 4.3 mmol/L (ref 3.5–5.1)
SODIUM: 127 mmol/L — AB (ref 135–145)

## 2015-12-23 LAB — CBC WITH DIFFERENTIAL/PLATELET
Basophils Absolute: 0 10*3/uL (ref 0.0–0.1)
Basophils Relative: 0 %
EOS ABS: 0 10*3/uL (ref 0.0–0.7)
EOS PCT: 0 %
HCT: 27.3 % — ABNORMAL LOW (ref 39.0–52.0)
Hemoglobin: 8.7 g/dL — ABNORMAL LOW (ref 13.0–17.0)
LYMPHS ABS: 1.5 10*3/uL (ref 0.7–4.0)
LYMPHS PCT: 16 %
MCH: 31 pg (ref 26.0–34.0)
MCHC: 31.9 g/dL (ref 30.0–36.0)
MCV: 97.2 fL (ref 78.0–100.0)
MONO ABS: 0.9 10*3/uL (ref 0.1–1.0)
MONOS PCT: 9 %
Neutro Abs: 7 10*3/uL (ref 1.7–7.7)
Neutrophils Relative %: 75 %
PLATELETS: 620 10*3/uL — AB (ref 150–400)
RBC: 2.81 MIL/uL — ABNORMAL LOW (ref 4.22–5.81)
RDW: 13.7 % (ref 11.5–15.5)
WBC: 9.4 10*3/uL (ref 4.0–10.5)

## 2015-12-23 MED ORDER — ALPRAZOLAM 0.5 MG PO TABS
0.5000 mg | ORAL_TABLET | Freq: Three times a day (TID) | ORAL | Status: DC | PRN
  Administered 2015-12-23: 0.5 mg via ORAL
  Filled 2015-12-23: qty 1

## 2015-12-23 MED ORDER — GI COCKTAIL ~~LOC~~
30.0000 mL | Freq: Three times a day (TID) | ORAL | Status: DC | PRN
  Administered 2015-12-23: 30 mL via ORAL
  Filled 2015-12-23 (×2): qty 30

## 2015-12-23 MED ORDER — ALUM & MAG HYDROXIDE-SIMETH 200-200-20 MG/5ML PO SUSP
15.0000 mL | ORAL | Status: DC | PRN
  Administered 2015-12-23: 15 mL via ORAL
  Filled 2015-12-23: qty 30

## 2015-12-23 NOTE — Progress Notes (Signed)
[place] PHYSICAL MEDICINE & REHABILITATION     PROGRESS NOTE  Subjective/Complaints:  Patient seen lying in bed this morning. He complains of abdominal pain. He states that he has had this before and underwent away. His brother also requests to speak to me on the phone, which I did again today.  ROS: + TTP left chest wall mass, abdominal pain. Denies CP, SOB, N/V/D.  Objective: Vital Signs: Blood pressure 121/71, pulse 74, temperature 98.5 F (36.9 C), temperature source Oral, resp. rate 17, height '6\' 6"'$  (1.981 m), weight 73.12 kg (161 lb 3.2 oz), SpO2 99 %. No results found.  Recent Labs  12/23/15 0442  WBC 9.4  HGB 8.7*  HCT 27.3*  PLT 620*    Recent Labs  12/23/15 0442  NA 127*  K 4.3  CL 89*  GLUCOSE 109*  BUN 16  CREATININE 0.89  CALCIUM 9.7   CBG (last 3)  No results for input(s): GLUCAP in the last 72 hours.  Wt Readings from Last 3 Encounters:  12/16/15 73.12 kg (161 lb 3.2 oz)  12/06/15 73.211 kg (161 lb 6.4 oz)    Physical Exam:  BP 121/71 mmHg  Pulse 74  Temp(Src) 98.5 F (36.9 C) (Oral)  Resp 17  Ht '6\' 6"'$  (1.981 m)  Wt 73.12 kg (161 lb 3.2 oz)  BMI 18.63 kg/m2  SpO2 99% Constitutional: He appears cachetic. NAD. Vital signs reviewed. Head: Normocephalic. Atraumatic.  Eyes: EOM and Conj are normal.  Cardiovascular: Normal rate and regular rhythm. no murmurs rubs,  Respiratory:  Effort normal. No distress.  GI: + TTP. Soft. Bowel sounds are normal. He exhibits no distension.  Musc: pain in left hip with PROM of leg. Large nodule left chest wall Neurological: He is alert and oriented.   Motor: B/l UE: 4+/5 prox, 5/5 distal.  RLE: 4+/5 proximally, 5/5 distally.  LLE: (pain) 2/5 proximally, 5/5 distally.  Skin:  Incision with staples C/D/I.  Left thigh is swollen and tender no ecchymosis Small nodule posterior scalp without erythema or warmth, nontender, likely lipoma  Assessment/Plan: 1. Functional deficits secondary to pathologic  left femur fracture/poorly differentiated adenocarcinoma, s/p IM nail placement which require 3+ hours per day of interdisciplinary therapy in a comprehensive inpatient rehab setting. Physiatrist is providing close team supervision and 24 hour management of active medical problems listed below. Physiatrist and rehab team continue to assess barriers to discharge/monitor patient progress toward functional and medical goals.  Function:  Bathing Bathing position   Position: Bed  Bathing parts Body parts bathed by patient: Right arm, Left arm, Chest, Abdomen, Front perineal area, Right upper leg, Left upper leg Body parts bathed by helper: Buttocks, Right lower leg, Left lower leg, Back  Bathing assist Assist Level: Touching or steadying assistance(Pt > 75%)      Upper Body Dressing/Undressing Upper body dressing   What is the patient wearing?: Pull over shirt/dress     Pull over shirt/dress - Perfomed by patient: Thread/unthread right sleeve, Thread/unthread left sleeve, Put head through opening Pull over shirt/dress - Perfomed by helper: Pull shirt over trunk        Upper body assist Assist Level: Touching or steadying assistance(Pt > 75%)   Set up : To obtain clothing/put away  Lower Body Dressing/Undressing Lower body dressing   What is the patient wearing?: Pants Underwear - Performed by patient: Thread/unthread right underwear leg, Thread/unthread left underwear leg, Pull underwear up/down Underwear - Performed by helper: Thread/unthread right underwear leg, Thread/unthread left underwear leg,  Pull underwear up/down Pants- Performed by patient: Thread/unthread right pants leg, Fasten/unfasten pants, Pull pants up/down Pants- Performed by helper: Thread/unthread left pants leg Non-skid slipper socks- Performed by patient: Don/doff right sock, Don/doff left sock     Socks - Performed by helper: Don/doff right sock, Don/doff left sock   Shoes - Performed by helper: Don/doff right  shoe, Don/doff left shoe, Fasten right, Fasten left          Lower body assist Assist for lower body dressing: Touching or steadying assistance (Pt > 75%)      Toileting Toileting Toileting activity did not occur: No continent bowel/bladder event Toileting steps completed by patient: Performs perineal hygiene Toileting steps completed by helper: Adjust clothing prior to toileting, Performs perineal hygiene, Adjust clothing after toileting (per Malachi Carl, NT)    Toileting assist     Transfers Chair/bed transfer   Chair/bed transfer method: Ambulatory Chair/bed transfer assist level: Supervision or verbal cues Chair/bed transfer assistive device: Medical sales representative     Max distance: 35 ft Assist level: Supervision or verbal cues   Wheelchair   Type: Manual Max wheelchair distance: >1000 ft Assist Level: No help, No cues, assistive device, takes more than reasonable amount of time  Cognition Comprehension Comprehension assist level: Understands complex 90% of the time/cues 10% of the time  Expression Expression assist level: Expresses complex 90% of the time/cues < 10% of the time  Social Interaction Social Interaction assist level: Interacts appropriately with others with medication or extra time (anti-anxiety, antidepressant).  Problem Solving Problem solving assist level: Solves basic 90% of the time/requires cueing < 10% of the time  Memory Memory assist level: Recognizes or recalls 90% of the time/requires cueing < 10% of the time     Medical Problem List and Plan: 1. Limited functional mobility secondary to pathologic left femur fracture/poorly differentiated adenocarcinoma. Status post intramedullary nail placement 12/09/2015.   Weightbearing as tolerated  Continue CIR  No recorded results to date in EHR for final results of biopsy that were sent off for adenocarcinoma 2. DVT Prophylaxis/Anticoagulation: SCDs.   Vascular study negative for DVT  on 5/17 3. Pain Management: OxyContin sustained release 40 mg every 12 hours, oxycodone and Robaxin as needed.   Monitor with increased mobility, will increase as necessary   -add lidoderm patch to nodule on left chest wall 4. Mood: team to provide ego support. Patient encouraged to be open with team regarding any need for support, counseling, etc 5. Neuropsych: This patient is capable of making decisions on his own behalf. 6. Skin/Wound Care: Routine skin checks 7. Fluids/Electrolytes/Nutrition: encourage po   Hyponatremia: 127 on 5/23  Will cont to monitor 8. Acute blood loss anemia.   Hb 8.7 on 5/23  Hemoccult stools Remain pending  Will continue to monitor 9. Constipation. Laxative assistance 10. Tobacco abuse. Counseling 11. Hypoalbuminemia:  Supplement started on 5/17 12. Posterior scalp lipoma  Stable in size, nontender, no warmth, no erythema  Will continue monitor for changes  LOS (Days) 7 A FACE TO FACE EVALUATION WAS PERFORMED  Marvin Hardy Lorie Phenix 12/23/2015 8:19 AM

## 2015-12-23 NOTE — Progress Notes (Signed)
Occupational Therapy Note  Patient Details  Name: Marvin Hardy MRN: [id number] Date of Birth: [birth year]  Today's Date: 12/23/2015 OT Missed Time: 18 Minutes Missed Time Reason: Pain  Pt missed 30 mins skilled OT treatment session secondary to pain.  Pt in bed upon arrival reporting pain 10/10 in abdomen and requesting to defer therapy at this time.  Pt reports concern of metastasis of his cancer, however no confirmation.  Encouraged to continue to partake in therapy as able to tolerate and encouraged intake of food as tolerated as lunch tray had not been touched.  Will continue to follow as able.   Simonne Come 12/23/2015, 2:59 PM

## 2015-12-23 NOTE — Progress Notes (Signed)
Occupational Therapy Session Note  Patient Details  Name: Marvin Hardy MRN: [id number] Date of Birth: 21-Mar-1961  Today's Date: 12/23/2015 OT Individual Time: 1300-1315 OT Individual Time Calculation (min): 15 min   Short Term Goals: Week 1:  OT Short Term Goal 1 (Week 1): Pt will bathe at sink, seated and standing supported, with setup assist OT Short Term Goal 2 (Week 1): Pt will demo ability to use AE as needed to extend reach during lower body dressing OT Short Term Goal 3 (Week 1): Pt will demonstrate ability to apply 3 principles of energy conservation during performance of BADL OT Short Term Goal 4 (Week 1): Pt will maintain dynamic standing balance supported at [facility] for 5 min during functional task with supervision.  Skilled Therapeutic Interventions/Progress Updates: Therapeutic activity with focus on pain management, repositioning in bed, discharge planning.   Pt reporting continued severe abdominal pain with anxiety d/t ambiguous etiology of new pain.   Pt aware of X-ray reporting no bowel obstruction or new trauma.  Pt reports suspecting metastasis of his cancer however not receiving reports confirming this which only aggravates his pain.   OT offered to alert PA to pt's respectful request for clarification.   Pt attempted bed mobility to transfer to chair or w/c but was too limited by pain to engage in prolonged activity.   Therapy terminated d/t pain; pt left in bed at end of session with all needs within reach.   Per PA, precise definition of metastatic process is ongoing and MD has been made aware of pt's needs.     Therapy Documentation Precautions:  Precautions Precautions: Fall Restrictions Weight Bearing Restrictions: Yes LLE Weight Bearing: Weight bearing as tolerated   General: General OT Amount of Missed Time: 60 Minutes PT Missed Treatment Reason: Pain   Pain: Pain Assessment Pain Assessment: 0-10 Pain Score: 10-Worst pain ever Pain Type: Acute pain Pain  Location: Abdomen Pain Orientation: Mid Pain Descriptors / Indicators: Cramping Pain Frequency: Constant Pain Onset: On-going Patients Stated Pain Goal: 4 Pain Intervention(s): Medication (See eMAR);Repositioned;Distraction;Emotional support;Environmental changes Multiple Pain Sites: No  Exercises:     Other Treatments:     See Function Navigator for Current Functional Status.   Therapy/Group: Individual Therapy  Landry Lookingbill 12/23/2015, 1:37 PM

## 2015-12-23 NOTE — Progress Notes (Signed)
Social Work Patient ID: Marvin Hardy, male   DOB: Aug 07, 1960, 55 y.o.   MRN: [id number] Spoke with [name]-pt's brother to get the name of MD they want pt to see. Have given Dr Oneita Kras name and number to Dan-PA and MD so referral can be made prior to pt's discharge from the  Hospital and an appointment can be made. [name] to get name of home health agency this worker can call to inquire about follow up therapies. Will order equipment once team decides what pt will need for home. Work on discharge needs.

## 2015-12-23 NOTE — Progress Notes (Signed)
Physical Therapy Session Note  Patient Details  Name: Marvin Hardy MRN: [id number] Date of Birth: [birth year]  Today's Date: 12/23/2015 PT Individual Time: 4403-4742 and 1133-1201 PT Individual Time Calculation (min): 10 min and 28 min   Short Term Goals: Week 1:  PT Short Term Goal 1 (Week 1): short term goals = LTG due to ELOS.   Skilled Therapeutic Interventions/Progress Updates:    Treatment 1: Pt received in bed noting 10/10 abdominal pain & had hot packs on area. Therapist informed RN of pt's c/o pain with RN noting pt denied pain medication & is awaiting x-ray transport. Discussed potential benefits of pain medication with pt but pt denied. X-ray transport arrived to carry pt off of unit; pt missed 50 minutes skilled PT.  Treatment 2: Pt received in bed & agreeable to PT, noting 10/10 abdominal pain but agreeable to bed level exercises. Pt performed BLE ankle pumps & LLE short arc quads & heel slides to pt tolerance. PT notified RN of pt's c/o pain but pt has already received pain medication. While in bed pt required assistance to transfer supine>long sit to take liquid medication. At end of session pt left in bed with all needs within reach.  Therapy Documentation Precautions:  Precautions Precautions: Fall Restrictions Weight Bearing Restrictions: Yes LLE Weight Bearing: Weight bearing as tolerated  General: PT Amount of Missed Time (min): 50 Minutes PT Missed Treatment Reason: Xray;Pain  Pain: Pain Assessment Pain Assessment: 0-10 Pain Score: 10-Worst pain ever Pain Location: Abdomen Pain Intervention(s): RN made aware   See Function Navigator for Current Functional Status.   Therapy/Group: Individual Therapy  Waunita Schooner 12/23/2015, 9:35 AM

## 2015-12-23 NOTE — Progress Notes (Signed)
Patient daughter, Mare Ferrari, called for update on patient status. Daughter contact information added to patient chart. Questions answered, support given. Daughter with some questions remaining of medical treatment and patient prognosis, specifically cancer related.  Will continue to monitor patient and to follow up with MD in AM.

## 2015-12-23 NOTE — Progress Notes (Signed)
During medication round '@0900'$ , nurse found pt in excruciating abdominal pain. Pt crying inconsolably and doubled over in a fetal position. Pt reported pain as 10/10. Stated that this is was the worst pain ever. Refused pain medication, stating that he would wait until he came back from ordered xray. Explained to pt that pain medication would help control  his discomfort, but would not obscure xray images. Pt still refusing med, but agreeable to non medication intervention, such as  cool compress to forehead, and heat pack to abdomen. Upon return from xray, pt given prn Oxy IR 15 mg as well as placement of Lidocaine patch to abdomen. Intermittent period of decreased discomfort, as indicated by pt's resting comfortably x 1 hour and participating in short therapy session with [place], PT  Silvestre Mesi, PA made aware of xray's result, and pt's discomfort as indicated above. New order received for GI cocktail (Maalox, Lidocaine and Donnatal).

## 2015-12-24 ENCOUNTER — Inpatient Hospital Stay (HOSPITAL_COMMUNITY): Payer: BLUE CROSS/BLUE SHIELD | Admitting: Physical Therapy

## 2015-12-24 ENCOUNTER — Telehealth: Payer: Self-pay | Admitting: Internal Medicine

## 2015-12-24 ENCOUNTER — Inpatient Hospital Stay (HOSPITAL_COMMUNITY): Payer: BLUE CROSS/BLUE SHIELD | Admitting: Occupational Therapy

## 2015-12-24 DIAGNOSIS — F4321 Adjustment disorder with depressed mood: Secondary | ICD-10-CM | POA: Insufficient documentation

## 2015-12-24 DIAGNOSIS — R609 Edema, unspecified: Secondary | ICD-10-CM

## 2015-12-24 DIAGNOSIS — R52 Pain, unspecified: Secondary | ICD-10-CM

## 2015-12-24 MED ORDER — FLUOXETINE HCL 10 MG PO CAPS
10.0000 mg | ORAL_CAPSULE | Freq: Every day | ORAL | Status: DC
  Administered 2015-12-24 – 2015-12-27 (×4): 10 mg via ORAL
  Filled 2015-12-24 (×4): qty 1

## 2015-12-24 NOTE — Progress Notes (Signed)
Physical Therapy Session Note  Patient Details  Name: Marvin Hardy MRN: [id number] Date of Birth: [birth year]  Today's Date: 12/24/2015 PT Individual Time: 1547-1610 PT Individual Time Calculation (min): 23 min   Short Term Goals: Week 1:  PT Short Term Goal 1 (Week 1): short term goals = LTG due to ELOS.   Skilled Therapeutic Interventions/Progress Updates:    Pt received in bed reporting 8/10 LLE pain. Discussed d/c plans with pt & he reports he plans to remove seats in car, make pallet in vehicle & ride to [place] in that manner. Educated pt on decreased safety with this plan and PT recommendations for EMS transport. Discussed need for rest breaks on trip to [place] as well. Pt agreeable to bed level exercises & completed BLE heel slides (AAROM/AROM LLE) 2 x 20. Pt reported increasing LLE & abdominal pain with activity & refused further participation. Pt very frustrated during session, reporting "no one can tell him what is wrong but they still want him to do therapy". Educated pt on need for PT follow LLE surgery. At end of session pt left in bed with LLE elevated on pillow & all needs within reach; encouraged pt to eat to have energy to participate in therapy.   Therapy Documentation Precautions:  Precautions Precautions: Fall Restrictions Weight Bearing Restrictions: Yes LLE Weight Bearing: Weight bearing as tolerated  Pain: Pain Assessment Pain Assessment: 0-10 Pain Score: 8  Pain Location: Leg Pain Orientation: Left Pain Intervention(s):  (pt repositioned himself in bed)    See Function Navigator for Current Functional Status.   Therapy/Group: Individual Therapy  Waunita Schooner 12/24/2015, 8:01 AM

## 2015-12-24 NOTE — Plan of Care (Signed)
Problem: RH Bed Mobility Goal: LTG Patient will perform bed mobility with assist (PT) LTG: Patient will perform bed mobility with assistance, with/without cues (PT).  With use of hospital bed features; downgrade 2/2 slow progress  Problem: RH Bed to Chair Transfers Goal: LTG Patient will perform bed/chair transfers w/assist (PT) LTG: Patient will perform bed/chair transfers with assistance, with/without cues (PT).  With LRAD; downgrade 2/2 slow progress  Problem: RH Car Transfers Goal: LTG Patient will perform car transfers with assist (PT) LTG: Patient will perform car transfers with assistance (PT).  With LRAD; downgrade 2/2 slow progress  Problem: RH Stairs Goal: LTG Patient will ambulate up and down stairs w/assist (PT) LTG: Patient will ambulate up and down # of stairs with assistance (PT)  12 steps for access to second story apartment; downgrade due to slow progress

## 2015-12-24 NOTE — Progress Notes (Signed)
Patient verbalizing to nursing "yall are all liers and are not telling me the truth". Requested patient to explain what information he requested and to review all medications to assist the patient in understanding pain management care plan. Patient began asking questions and then became irritable and said "I'm good" and refused to talk about medications. Patient reports he is ready to "get out of this place" . Encouraged patient to ask any question of nursing to assist him in meeting his expectations of care. Patient reported he was ready to go home. Attempted to provide emotional support. Continue with plan of care.  Mliss Sax

## 2015-12-24 NOTE — Consult Note (Signed)
Initial Psychodiagnostic Examination - [place]   Mr. 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    During today's session, Mr. Marvin Hardy acknowledged some frustration associated with not knowing the extent of his condition (e.g. biopsy results).  However, he also reported remaining optimistic and mentioned that the fact that he does not know gives him hope that he may have some options or that his life expectancy may be longer than he originally thought.  He said that he typically uses prayer, reads scriptures, and talks to family members to help him cope with his situation.  He also noted that talking with staff members is helpful in keeping spirits elevated, even when it is only small talk.  Mr. Marvin Hardy was provided with time and space to process his emotions surrounding his medical condition and was given validation regarding his emotions; his reactions were normalized.  He mentioned that he occasionally "snaps" at staff members when he is extreme pain; he stated that he would like staff members to listen to him during those times regarding how to position his body to reduce pain.    Impressions and Recommendations:  Mr. Marvin Hardy seems to be having mild depressive symptoms related to adjustment to illness.  There was no evidence of a more pervasive psychological condition.  He was encouraged to continue having periodic "break downs" as that represents a healthy way to release emotions during this difficult time.  Mr. Marvin Hardy reported benefitting from the opportunity to speak with the neuropsychologist and therefore, a follow-up will be scheduled next week to continue to assist with coping.  It is hoped that he will have the results of his biopsy by  then and that the neuropsychologist can help him with any adjustment issues associated with receiving that news.    DIAGNOSIS:   Adjustment disorder with depressed mood  Marlane Hatcher, Psy.D.  Clinical Neuropsychologist

## 2015-12-24 NOTE — Patient Care Conference (Signed)
Inpatient RehabilitationTeam Conference and Plan of Care Update Date: 12/24/2015   Time: 2:00 PM    Patient Name: Marvin Hardy      Medical Record Number: [id number]  Date of Birth: 05-Feb-1961 Sex: Male         Room/Bed: 4W24C/4W24C-01 Payor Info: Payor: [place] / Plan: BCBS OTHER / Product Type: *No Product type* /    Admitting Diagnosis: L FEMUR FX   Admit Date/Time:  12/16/2015  4:37 PM Admission Comments: No comment available   Primary Diagnosis:  <principal problem not specified> Principal Problem: <principal problem not specified>  Patient Active Problem List   Diagnosis Date Noted  . Swelling   . Adjustment disorder with depressed mood   . Generalized abdominal pain   . Lipoma   . Hyponatremia   . Acute blood loss anemia   . Hypoalbuminemia due to protein-calorie malnutrition ([place])   . Adenocarcinoma ([place]) 12/16/2015  . 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 Effusion of knee   . Fracture   . Mass of soft tissue   . Pain   . Femur fracture ([place]) 12/06/2015  . Femur fracture, left ([place]) 12/06/2015  . Closed fracture of shaft of left femur ([place])   . Swelling of right testicle     Expected Discharge Date: Expected Discharge Date: 12/27/15  Team Members Present: Physician leading conference: Dr. Delice Lesch Social Worker Present: Ovidio Kin, LCSW Nurse Present: Dorien Chihuahua, RN PT Present: Barrie Folk, PT;Victoria Sabra Heck, PT OT Present: Willeen Cass, OT SLP Present: Windell Moulding, SLP PPS Coordinator present : Daiva Nakayama, RN, CRRN     Current Status/Progress Goal Weekly Team Focus  Medical   Limited functional mobility secondary to pathologic left femur fracture/poorly differentiated adenocarcinoma. Status post intramedullary nail placement 12/09/2015.  Improvine mobility, electrolytes, improvement in Hb  See above   Bowel/Bladder   Continent of bowel an bladder. LBM 12/20/15   Pt to remain continent of bowel  and bladder  Monitor    Swallow/Nutrition/ Hydration             ADL's   Overall Min A for B&D (limited by pain), Min A sit<>stand  and transfers w/extra time  Overall Mod I  Activity tolerance, pain managment, transfers, AE training, adapted bathing/dressing, d/c planning   Mobility   supervision for ambulation with RW maximum 50 ft, has not practiced stair negotiation, w/c mobility supervision/mod I in community >1000 ft, Min A for LLE with bed mobility; pt limited by signitificant pain  househould ambulatory goals with supervision, negotiate full flight of stairs with supervision to access sister's apartment, w/c level for community mobility  increasing pt's activity tolerance, pt education, gait training, w/c mobility   Communication             Safety/Cognition/ Behavioral Observations            Pain   Scheduled Oxycondone '40mg'$  q 12hrs, Oxy IR '15mg'$  q 4hrs  <5  Encourage pt to request pain medication more frequently so he can participate more fully   Skin   Left hip w/dry dressing, unremarkable. L scapula nodule, L spinal nodule pt refusing Lidocaine patch to area.   No additional skin breakdown  Assess and monitor skin q shift      *See Care Plan and progress notes for long and short-term goals.  Barriers to Discharge: Hyponatremia, ABLA, poor endruance, mobility, transfers, Heme/Onc transfer of care    Possible Resolutions to  Barriers:  Pt edu, follow bx results, therapies, hemoccult stools,Heme/Onc follow up    Discharge Planning/Teaching Needs:  Family to come Sat and take to sister's home in [place]. MD to contact oncologist in [place] to try to get new pt appointment. Wife has rented a [employer] for him to go to [place] in trip takes 8;45 hours.      Team Discussion:  Pain issues MD managing with medications. Hemoccult ordered. Stomach pain limits him in therapies. Downgraded goals to supervision level. Neuro-psych seeing tomorrow for added support. PT-[place] to do car  transfer on Sat. Pt wants answers regarding his cancer and prognosis  Revisions to Treatment Plan:  Downgraded goals to supervision level   Continued Need for Acute Rehabilitation Level of Care: The patient requires daily medical management by a physician with specialized training in physical medicine and rehabilitation for the following conditions: Daily direction of a multidisciplinary physical rehabilitation program to ensure safe treatment while eliciting the highest outcome that is of practical value to the patient.: Yes Daily medical management of patient stability for increased activity during participation in an intensive rehabilitation regime.: Yes Daily analysis of laboratory values and/or radiology reports with any subsequent need for medication adjustment of medical intervention for : Post surgical problems;Other;Mood/behavior problems  Elease Hashimoto 12/24/2015, 3:07 PM

## 2015-12-24 NOTE — Progress Notes (Signed)
Physical Therapy Session Note  Patient Details  Name: Marvin Hardy MRN: [id number] Date of Birth: 02-28-1961  Today's Date: 12/24/2015 PT Individual Time: 0900-0955 AND 1310-1400 PT Individual Time Calculation (min): 55 min AND 79mn  Short Term Goals: Week 1:  PT Short Term Goal 1 (Week 1): short term goals = LTG due to ELOS.   Skilled Therapeutic Interventions/Progress Updates:    Patient received supine in bed and agreeable to PT at bed level. He declined OOB activity due to continued abdominal pain 8/10. RN notified of patient's pain and administered meds during treatment session  Supine therex: SLR 2x 12 with Min A on LLE due to pain, SAQ 2x 12 BLE, Heel slides x 12 BLE with significant decrease in Hip ROM in LLE, Hip add/abd 2x12 BLE , bridges with push through BLE R>L x 10 with 2 second hold,  Ankle PF against manual resistance x 20 BLE. Ankle DF against manual resistance x 20 BLE.  PT instructed patient in Bed mobility for roll R and [facility] features and min verbal cues for proper LE/UE placement to decrease pain in hip and improve sequencing of movement.  Patient left supine in bed with call bell within reach.   Session 2   Patient received supine in bed declining PT, stating that he had just finished eating some soup and needed his stomach to settle before he attempted an out of bed activity. PT educated the patient on continued participation with plan of care and importance of increased movement. Patient requested 10 minutes of rest and reported that he would participate in OOB activity after that.   PT Returned and instructed patient in bed mobility for supine to sit with Min A to assist LLE out of bed. Squat pivot transfer to [facility] supervision A from  PT with cues for UE placement.   PT transported CIR Day room. Patient patient participated in [place] party to improved BUE and BLE endurance and ROM seated in WC 2 sets of 10 minutes. Patient  performed standing tolerance and weight shifting to music with BUE support x 6 minutes. Patient required supervision A while standing for improved safety wiyth balance and CGA with sit<>stand for proper Weight shift and UE positioning.   Patient performed WC mobility on carpet and tile floor for 1565ffrom day room and patient's room without cues from PT, but noted to require increased time and effort for obstacle navigation in hall.   [facility] reach at end of session.   Therapy Documentation Precautions:  Precautions Precautions: Fall Restrictions Weight Bearing Restrictions: Yes LLE Weight Bearing: Weight bearing as tolerated General:   Vital Signs:   Pain: Pain Assessment Pain Assessment: 0-10 Pain Score: 8  Pain Location: Abdomen Pain Orientation: Mid;Lower Pain Descriptors / Indicators: Aching;Sharp Patients Stated Pain Goal: 6 Multiple Pain Sites: No  See Function Navigator for Current Functional Status.   Therapy/Group: Individual Therapy  AuLorie Phenix/24/2017, 9:58 AM

## 2015-12-24 NOTE — Progress Notes (Signed)
Occupational Therapy Session Note  Patient Details  Name: Marvin Hardy MRN: [id number] Date of Birth: 1960-12-26  Today's Date: 12/24/2015 OT Individual Time: 1400-1417 OT Individual Time Calculation (min): 17 min (make up session)   Short Term Goals: Week 1:  OT Short Term Goal 1 (Week 1): Pt will bathe at sink, seated and standing supported, with setup assist OT Short Term Goal 2 (Week 1): Pt will demo ability to use AE as needed to extend reach during lower body dressing OT Short Term Goal 3 (Week 1): Pt will demonstrate ability to apply 3 principles of energy conservation during performance of BADL OT Short Term Goal 4 (Week 1): Pt will maintain dynamic standing balance supported at [facility] for 5 min during functional task with supervision.  Skilled Therapeutic Interventions/Progress Updates:    Upon entering the room, pt seated in wheelchair with 7/10 c/o pain in abdomen. Pt requesting to return to bed with assist to manage L leg rest of wheelchair. Pt performed sit <>stand with wheelchair with supervision. Pt ambulating with RW and close supervision for transfer into bed. Pt needing assistance to bring L LE into bed from floor. Pt declined further OT intervention at this time secondary to increased pain and fatigue. OT discussed and educated pt on other possible way to manage pain and anxiety regarding therapy sessions. Pt verbalized understanding. Call bell and all needed items within reach upon exiting the room.   Therapy Documentation Precautions:  Precautions Precautions: Fall Restrictions Weight Bearing Restrictions: Yes LLE Weight Bearing: Weight bearing as tolerated ADL: ADL ADL Comments: see functional navigator  See Function Navigator for Current Functional Status.   Therapy/Group: Individual Therapy  Phineas Semen 12/24/2015, 2:34 PM

## 2015-12-24 NOTE — Progress Notes (Signed)
Occupational Therapy Note  Patient Details  Name: Marvin Hardy MRN: [id number] Date of Birth: Sep 07, 1960  Today's Date: 12/24/2015 OT Individual Time: 9518-8416 OT Individual Time Calculation (min): 8 min   Pt missed 37 min due to pain and fatigue.  Pt expressed frustration and sadness about his new onset of abdomen discomfort. Pt stating he just wants to leave and move on with his CA treatment and frustrated why 'they" cant give him answers here. Discussed therapy goals and tolerance to traveling to [place] via car and transfers in public places on the way for stops. Pt requested Gatorade to drink- placed an order for some for the pt. Made RN aware and discussed with team.   Willeen Cass [facility] 12/24/2015, 11:42 AM

## 2015-12-24 NOTE — Telephone Encounter (Signed)
Pt aware of np appt on 01/07/16'@1'$ :45

## 2015-12-24 NOTE — Progress Notes (Signed)
Social Work Patient ID: Marvin Hardy, male   DOB: [birth year], 55 y.o.   MRN: [id number] Spoke with wife-Rose via telephone to discuss discharge needs and plans. She reports they are trying to get a Lucianne Lei and place a mattress in it so pt can lay down. She has a wheelchair and rollator walker for him to use. She will make sure the prescriptions will be filled on Friday night and be ready in the am Sat for discharge. She reports the ride is 8 hours and 45 minutes to [place]. Also spoke with brother-[name] who reports can he ride in a reclined seat due to [employer] and vans are gone He had concerns about the tumor on pt';s side and had questions. Have asked Dan-PA or MD to call him and answer his questions. Dan-PA to call brother today. MD has contacted Dr Wende Crease office in [place] to set up Oncology appointment. He is awaiting a return call. Have contacted PT agency to set up follow up for pt will fax referral. Will continue to work on discharge plans for Sat. Pt is exhausted from rehab party today  Will see pt in am since do not want to wake him up.

## 2015-12-24 NOTE — Progress Notes (Signed)
[place] PHYSICAL MEDICINE & REHABILITATION     PROGRESS NOTE  Subjective/Complaints:  Pt laying in bed this AM. Pt states his abdomen feels better after he received some medications last night.    ROS: + TTP left chest wall mass. Denies abd pain, CP, SOB, N/V/D.  Objective: Vital Signs: Blood pressure 117/76, pulse 88, temperature 98.6 F (37 C), temperature source Oral, resp. rate 18, height '6\' 6"'$  (1.981 m), weight 71.9 kg (158 lb 8.2 oz), SpO2 100 %. Dg Abd 2 Views  12/23/2015  CLINICAL DATA:  Upper abdominal pain beginning early today. EXAM: ABDOMEN - 2 VIEW COMPARISON:  None. FINDINGS: Nonobstructive bowel gas pattern. No free air organomegaly. No suspicious calcification. Postoperative changes partially imaged in the proximal left femur. IMPRESSION: No acute findings. Electronically Signed   By: Rolm Baptise M.D.   On: 12/23/2015 09:46    Recent Labs  12/23/15 0442  WBC 9.4  HGB 8.7*  HCT 27.3*  PLT 620*    Recent Labs  12/23/15 0442  NA 127*  K 4.3  CL 89*  GLUCOSE 109*  BUN 16  CREATININE 0.89  CALCIUM 9.7   CBG (last 3)  No results for input(s): GLUCAP in the last 72 hours.  Wt Readings from Last 3 Encounters:  12/24/15 71.9 kg (158 lb 8.2 oz)  12/06/15 73.211 kg (161 lb 6.4 oz)    Physical Exam:  BP 117/76 mmHg  Pulse 88  Temp(Src) 98.6 F (37 C) (Oral)  Resp 18  Ht '6\' 6"'$  (1.981 m)  Wt 71.9 kg (158 lb 8.2 oz)  BMI 18.32 kg/m2  SpO2 100% Constitutional: He appears cachetic. NAD. Vital signs reviewed. Head: Normocephalic. Atraumatic.  Eyes: EOM and Conj are normal.  Cardiovascular: Normal rate and regular rhythm. no murmurs rubs,  Respiratory:  Effort normal. No distress.  GI:  Soft. Bowel sounds are normal. He exhibits no distension.  Musc: pain in left hip with PROM of leg. Large nodule left chest wall (stable in size) Neurological: He is alert and oriented.   Motor: B/l UE: 4+/5 prox, 5/5 distal.  RLE: 4+/5 proximally, 5/5 distally.   LLE: (pain inhibition) 2/5 proximally, 5/5 distally.  Skin:  Incision with staples C/D/I.  Left thigh is swollen and tender no ecchymosis Small nodule posterior scalp without erythema or warmth, nontender, lipoma  Assessment/Plan: 1. 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 Physiatrist is providing close team supervision and 24 hour management of active medical problems listed below. Physiatrist and rehab team continue to assess barriers to discharge/monitor patient progress toward functional and medical goals.  Function:  Bathing Bathing position   Position: Bed  Bathing parts Body parts bathed by patient: Right arm, Left arm, Chest, Abdomen, Front perineal area, Right upper leg, Left upper leg Body parts bathed by helper: Buttocks, Right lower leg, Left lower leg, Back  Bathing assist Assist Level: Touching or steadying assistance(Pt > 75%)      Upper Body Dressing/Undressing Upper body dressing   What is the patient wearing?: Pull over shirt/dress     Pull over shirt/dress - Perfomed by patient: Thread/unthread right sleeve, Thread/unthread left sleeve, Put head through opening Pull over shirt/dress - Perfomed by helper: Pull shirt over trunk        Upper body assist Assist Level: Touching or steadying assistance(Pt > 75%)   Set up : To obtain  clothing/put away  Lower Body Dressing/Undressing Lower body dressing   What is the patient wearing?: Pants Underwear - Performed by patient: Thread/unthread right underwear leg, Thread/unthread left underwear leg, Pull underwear up/down Underwear - Performed by helper: Thread/unthread right underwear leg, Thread/unthread left underwear leg, Pull underwear up/down Pants- Performed by patient: Thread/unthread right pants leg, Fasten/unfasten pants,  Pull pants up/down Pants- Performed by helper: Thread/unthread left pants leg Non-skid slipper socks- Performed by patient: Don/doff right sock, Don/doff left sock     Socks - Performed by helper: Don/doff right sock, Don/doff left sock   Shoes - Performed by helper: Don/doff right shoe, Don/doff left shoe, Fasten right, Fasten left          Lower body assist Assist for lower body dressing: Touching or steadying assistance (Pt > 75%)      Toileting Toileting Toileting activity did not occur: No continent bowel/bladder event Toileting steps completed by patient: Performs perineal hygiene Toileting steps completed by helper: Adjust clothing prior to toileting, Performs perineal hygiene, Adjust clothing after toileting (per Malachi Carl, NT)    Toileting assist     Transfers Chair/bed transfer   Chair/bed transfer method: Ambulatory Chair/bed transfer assist level: Supervision or verbal cues Chair/bed transfer assistive device: Medical sales representative     Max distance: 35 ft Assist level: Supervision or verbal cues   Wheelchair   Type: Manual Max wheelchair distance: >1000 ft Assist Level: No help, No cues, assistive device, takes more than reasonable amount of time  Cognition Comprehension Comprehension assist level: Understands complex 90% of the time/cues 10% of the time  Expression Expression assist level: Expresses complex 90% of the time/cues < 10% of the time  Social Interaction Social Interaction assist level: Interacts appropriately with others with medication or extra time (anti-anxiety, antidepressant).  Problem Solving Problem solving assist level: Solves basic 90% of the time/requires cueing < 10% of the time  Memory Memory assist level: Recognizes or recalls 90% of the time/requires cueing < 10% of the time     Medical Problem List and Plan: 1. Limited functional mobility secondary to pathologic left femur fracture/poorly differentiated  adenocarcinoma. Status post intramedullary nail placement 12/09/2015.   Weightbearing as tolerated  Continue CIR  No recorded results to date in EHR for final results of biopsy that were sent off for adenocarcinoma   Left chest wall mass stable in size  Left message with Heme/Onc in [place], per pt request, awaiting call back from physician 2. DVT Prophylaxis/Anticoagulation: SCDs.   Vascular study negative for DVT on 5/17 3. Pain Management: OxyContin sustained release 40 mg every 12 hours, oxycodone and Robaxin as needed.   Monitor with increased mobility, will increase as necessary   -added lidoderm patch to nodule on left chest wall 4. Mood: team to provide ego support. Patient encouraged to be open with team regarding any need for support, counseling, etc 5. Neuropsych: This patient is capable of making decisions on his own behalf. 6. Skin/Wound Care: Routine skin checks 7. Fluids/Electrolytes/Nutrition: encourage po   Hyponatremia: 127 on 5/23  Will order labs for tomorrow  8. Acute blood loss anemia.   Hb 8.7 on 5/23  Hemoccult stools Remain pending  Will order labs for tomorrow 9. Constipation. Laxative assistance 10. Tobacco abuse. Counseling 11. Hypoalbuminemia:  Supplement started on 5/17 12. Posterior scalp lipoma  Stable in size, nontender, no warmth, no erythema  Will continue monitor for changes  LOS (Days) 8 A FACE TO FACE EVALUATION  WAS PERFORMED  Shanay Woolman Lorie Phenix 12/24/2015 11:00 AM

## 2015-12-24 NOTE — Plan of Care (Signed)
Problem: RH Balance Goal: LTG Patient will maintain dynamic sitting balance (PT) LTG: Patient will maintain dynamic sitting balance with assistance during mobility activities (PT)  Downgraded 2/2 slow progress Goal: LTG Patient will maintain dynamic standing balance (PT) LTG: Patient will maintain dynamic standing balance with assistance during mobility activities (PT)  Downgraded 2/2 slow progress

## 2015-12-24 NOTE — Plan of Care (Signed)
Problem: RH BOWEL ELIMINATION Goal: RH STG MANAGE BOWEL W/MEDICATION W/ASSISTANCE STG Manage Bowel with Medication with Assistance. Min A  Outcome: Not Progressing Pt refused sorbitol, suppository. LBM 5/20

## 2015-12-24 NOTE — Plan of Care (Signed)
Problem: RH BOWEL ELIMINATION Goal: RH STG MANAGE BOWEL WITH ASSISTANCE STG Manage Bowel with Assistance. Mod I  Outcome: Not Progressing Patient requires medication. No BM since 5-20

## 2015-12-25 ENCOUNTER — Inpatient Hospital Stay (HOSPITAL_COMMUNITY): Payer: BLUE CROSS/BLUE SHIELD | Admitting: Physical Therapy

## 2015-12-25 ENCOUNTER — Inpatient Hospital Stay (HOSPITAL_COMMUNITY): Payer: BLUE CROSS/BLUE SHIELD

## 2015-12-25 ENCOUNTER — Encounter (HOSPITAL_COMMUNITY): Payer: BLUE CROSS/BLUE SHIELD

## 2015-12-25 ENCOUNTER — Inpatient Hospital Stay (HOSPITAL_COMMUNITY): Payer: BLUE CROSS/BLUE SHIELD | Admitting: Occupational Therapy

## 2015-12-25 DIAGNOSIS — Z515 Encounter for palliative care: Secondary | ICD-10-CM

## 2015-12-25 DIAGNOSIS — R64 Cachexia: Secondary | ICD-10-CM

## 2015-12-25 DIAGNOSIS — D72829 Elevated white blood cell count, unspecified: Secondary | ICD-10-CM | POA: Insufficient documentation

## 2015-12-25 DIAGNOSIS — Z7189 Other specified counseling: Secondary | ICD-10-CM

## 2015-12-25 LAB — CBC WITH DIFFERENTIAL/PLATELET
Basophils Absolute: 0 10*3/uL (ref 0.0–0.1)
Basophils Relative: 0 %
EOS ABS: 0 10*3/uL (ref 0.0–0.7)
EOS PCT: 0 %
HCT: 25.2 % — ABNORMAL LOW (ref 39.0–52.0)
Hemoglobin: 8.4 g/dL — ABNORMAL LOW (ref 13.0–17.0)
LYMPHS ABS: 1.4 10*3/uL (ref 0.7–4.0)
LYMPHS PCT: 9 %
MCH: 32.7 pg (ref 26.0–34.0)
MCHC: 33.3 g/dL (ref 30.0–36.0)
MCV: 98.1 fL (ref 78.0–100.0)
MONO ABS: 1.2 10*3/uL — AB (ref 0.1–1.0)
MONOS PCT: 7 %
Neutro Abs: 13.9 10*3/uL — ABNORMAL HIGH (ref 1.7–7.7)
Neutrophils Relative %: 84 %
PLATELETS: 544 10*3/uL — AB (ref 150–400)
RBC: 2.57 MIL/uL — AB (ref 4.22–5.81)
RDW: 13.8 % (ref 11.5–15.5)
WBC: 16.6 10*3/uL — AB (ref 4.0–10.5)

## 2015-12-25 LAB — BASIC METABOLIC PANEL
Anion gap: 10 (ref 5–15)
BUN: 20 mg/dL (ref 6–20)
CHLORIDE: 88 mmol/L — AB (ref 101–111)
CO2: 27 mmol/L (ref 22–32)
CREATININE: 0.83 mg/dL (ref 0.61–1.24)
Calcium: 9.6 mg/dL (ref 8.9–10.3)
GFR calc Af Amer: >60 mL/min (ref >60)
GFR calc non Af Amer: >60 mL/min (ref >60)
GLUCOSE: 98 mg/dL (ref 65–99)
POTASSIUM: 4.4 mmol/L (ref 3.5–5.1)
SODIUM: 125 mmol/L — AB (ref 135–145)

## 2015-12-25 MED ORDER — FLUOXETINE HCL 10 MG PO CAPS: 10.0000 mg | ORAL_CAPSULE | Freq: Every day | ORAL | Status: AC

## 2015-12-25 MED ORDER — ALPRAZOLAM 0.5 MG PO TABS: 0.5000 mg | ORAL_TABLET | Freq: Three times a day (TID) | ORAL | Status: AC | PRN

## 2015-12-25 MED ORDER — METHOCARBAMOL 500 MG PO TABS: 500.0000 mg | ORAL_TABLET | Freq: Four times a day (QID) | ORAL | Status: AC | PRN

## 2015-12-25 MED ORDER — LIDOCAINE 5 % EX PTCH: 2.0000 | MEDICATED_PATCH | CUTANEOUS | Status: AC

## 2015-12-25 MED ORDER — OXYCODONE HCL 10 MG PO TABS
10.0000 mg | ORAL_TABLET | ORAL | Status: AC | PRN
Start: 2015-12-25 — End: ?

## 2015-12-25 MED ORDER — SODIUM CHLORIDE 1 G PO TABS
1.0000 g | ORAL_TABLET | Freq: Three times a day (TID) | ORAL | Status: DC
  Administered 2015-12-25 – 2015-12-27 (×5): 1 g via ORAL
  Filled 2015-12-25 (×8): qty 1

## 2015-12-25 MED ORDER — PANTOPRAZOLE SODIUM 40 MG PO TBEC: 40.0000 mg | DELAYED_RELEASE_TABLET | Freq: Every day | ORAL | Status: AC

## 2015-12-25 MED ORDER — POLYETHYLENE GLYCOL 3350 17 G PO PACK: 17.0000 g | PACK | Freq: Every day | ORAL | Status: AC

## 2015-12-25 MED ORDER — OXYCODONE HCL ER 40 MG PO T12A: 40.0000 mg | EXTENDED_RELEASE_TABLET | Freq: Two times a day (BID) | ORAL | Status: AC

## 2015-12-25 NOTE — Progress Notes (Addendum)
Patient more withdrawn than previous two HS shifts with this RN with minimal conversation. Verbalized "Only two more days until I'm out of this place. No one cares about me, no one does anything for me." Patient would not expand any more with the conversation, RN gave emotional support.  Patient denied any further needs at this time. Will continue to monitor.   Patient denied prn bowel medications at this time.  Patient educated, LBM 5/20.  Patient said he would be open to taking meds in the AM.  Will continue to monitor.

## 2015-12-25 NOTE — Discharge Summary (Signed)
Discharge summary job # [id number]

## 2015-12-25 NOTE — Plan of Care (Signed)
Problem: RH BOWEL ELIMINATION Goal: RH STG MANAGE BOWEL W/MEDICATION W/ASSISTANCE STG Manage Bowel with Medication with Assistance. Min A  Outcome: Not Progressing Patient refusing PRN meds at this time; LBM 5/20.

## 2015-12-25 NOTE — Progress Notes (Signed)
Patient refused OT therapy. Patient continues to be irritable and has angry affect while communicating with nursing staff. Patient continues to report staff interrupts him and does not allow rest breaks. Reviewed with patient staff will attempt to provide rest breaks, give medications at 1 time if possible. Continue with plan of care. Mliss Sax

## 2015-12-25 NOTE — Discharge Summary (Signed)
NAMEMarland Kitchen  Marvin Hardy, Marvin Hardy NO.:  [id number]  MEDICAL RECORD NO.:  [id number]  LOCATION:  4W24C                        FACILITY:  [facility]  PHYSICIAN:  Delice Lesch, MD        DATE OF BIRTH:  [birth year]  DATE OF ADMISSION:  12/15/2015 DATE OF DISCHARGE:  12/27/2015                              DISCHARGE SUMMARY   DISCHARGE DIAGNOSES: 1. Poorly differentiated adenocarcinoma with pathologic left femur     fracture, status post intramedullary nailing. 2. SCDs for DVT prophylaxis. 3. Pain management. 4. Depression. 5. Decreased nutritional storage. 6. Acute blood loss anemia. 7. Constipation. 8. Tobacco abuse. 9. Posterior scalp lipoma. 10.Leukocytosis. Improved  HISTORY OF PRESENT ILLNESS:  This is a 55 year old right-handed male, history of tobacco abuse, who presented Dec 06, 2015, when he felt a pop in his left leg while twisting while at work.  Denied any fall.  The patient had been living with a friend, was independent prior to admission.  X-rays and imaging revealed left displaced transverse fractures of the mid femoral diaphysis as well as irregular lytic lesion and periosteal reaction involving the mid femoral diaphysis at the fracture site concerning for metastatic disease.  CT abdomen and pelvis as well as CT of the chest showed a 2.5 x 3.6 right lower lobe lung mass suspicious for neoplasm.  Bilateral adrenal masses.  MRI of the brain did not show any parenchymal brain abnormalities, but did show a large 4 cm mass medial to the left pterygoid muscle consistent with metastasis. Lytic lesions of the femoral neck in intertrochanteric region.  No evidence of skeletal metastasis elsewhere.  Ultrasound-guided biopsy showed poorly differentiated adenocarcinoma, possibly primary lung. Oncology Service, Dr. Julien Nordmann, was consulted, but followup as an outpatient for plan of care.  The patient underwent intramedullary nail placement left femur on Dec 09, 2015, per Dr.  Ninfa Linden.  Hospital course, pain management.  Acute blood loss anemia 9.4, monitored.  The patient was weightbearing as tolerated.  Physical and occupational therapy ongoing.  The patient was admitted for comprehensive rehab program.  PAST MEDICAL HISTORY:  See discharge diagnoses.  SOCIAL HISTORY:  The patient had been living with a friend who was independent prior to admission and working.  Currently his plan was to move [place] to be closer to family and assistance as needed.  PHYSICAL EXAMINATION:  VITAL SIGNS:  Blood pressure 109/54, pulse 71, temperature 98, respirations 16. GENERAL:  This was an alert male, flat affect.  He was somewhat withdrawn person, place, and time.  He was oriented. LUNGS:  Decreased breath sounds.  Clear to auscultation. CARDIAC:  Regular rate and rhythm without murmur. ABDOMEN:  Soft, nontender.  Good bowel sounds. EXTREMITIES:  Hip incision clean and dry.  Staples were intact.  REHABILITATION HOSPITAL COURSE:  The patient was admitted to inpatient rehab services with therapies initiated on a 3-hour daily basis, consisting of physical therapy, occupational therapy, and rehabilitation nursing.  The following issues were addressed during the patient's rehabilitation stay.  Pertaining to Mr. [place]'s left pathologic femur fracture, he had undergone intramedullary nail placement, weightbearing as tolerated, neurovascular sensation intact.  Initial biopsy had shown poorly differentiated adenocarcinoma.  ETHR that is  bold type for final results of biopsy were pending.  Oncology Services Dr. Julien Nordmann was aware of noted findings.  The patient's discharge plan was to go to [place] to be closer to family.  Family arranging Oncology Services in [place] and information to be sent to Oncology Services in regard to ongoing plan of care.  The patient remained with SCDs for DVT prophylaxis, vascular studies negative.  Pain management with use of OxyContin  sustained release 40 mg every 12 hours as well as a Lidoderm patch and p.r.n. Robaxin as well as oxycodone immediate release.  The patient was needing some encouragement to continue his pain medications.  Bouts of hyponatremia 125, he was on salt tablets.  Acute blood loss anemia 8.4, monitored closely.  The patient did have a history of tobacco abuse, received counseling regard to cessation of nicotine products. Leukocytosis 16,000.  Urine study was pending.  Chest x-ray showed no evidence of acute pulmonary consolidation. There was noted right lower lobe pulmonary mass suspicious for primary malignancy. Follow-up WBC of 12,700. The patient with some initial bouts of abdominal pain, abdominal Flat plates were negative, bowel program was regulated. Palliative Care was consulted for ongoing patient's goals of care.  The patient was not participated with palliative care team and was making his request to be discharged to [place] to be closer to family and follow up Oncology Services while in [place].  The patient received weekly collaborative interdisciplinary team conferences to discuss estimated length of stay, family teaching, any barriers to his discharge.  Ongoing energy conservation techniques.  Needed some encouragement to participate.  Required some extra time to recover from sessions.  He can do straight leg raises with minimal assistance. Worked on sitting at edge of bed and pain control and the energy conservation techniques.  Performed wheelchair mobility on carpeted tile floors 150 feet at supervision.  Teaching was to be completed prior to the patient's departure to [place] with family.  DISCHARGE MEDICATIONS: 1. Tylenol as needed. 2. Xanax 0.5 mg p.o. t.i.d. as needed. 3. Colace 100 mg p.o. b.i.d. 4. Prozac 10 mg p.o. daily. 5. Lidoderm patch 2 patches for 12 hours every 24 hours. 6. Robaxin 500 mg p.o. every 6 hours as needed muscle spasms. 7. Oxycodone immediate release  10-15 mg every 4 as needed pain,     dispense of 90 tablets. 8. OxyContin sustained release 40 mg p.o. every 12 hours. 9. Protonix 40 mg p.o. daily. 10.MiraLAX 17 g daily.  DIET:  Regular.  SPECIAL INSTRUCTIONS:  The patient to follow up with Oncology Services in [place], the family was arranging followup.  Information could be obtained by Oncology Services at [facility] with Dr. Curt Bears for any information that could be related to Oncology Services while in [place]; Dr. Jean Rosenthal, Orthopedic Services as needed; and Dr. Delice Lesch as needed.     Lauraine Rinne, P.A.   ______________________________ Delice Lesch, MD    DA/MEDQ  D:  12/25/2015  T:  12/25/2015  Job:  [id number]  cc:   Eilleen Kempf, M.D. Lind Guest. Ninfa Linden, M.D.

## 2015-12-25 NOTE — Progress Notes (Signed)
Occupational Therapy Session Note  Patient Details  Name: Marvin Hardy MRN: [id number] Date of Birth: [birth year]  Today's Date: 12/25/2015 OT Individual Time: 1100-1120 OT Individual Time Calculation (min): 20 min    Short Term Goals: Week 1:  OT Short Term Goal 1 (Week 1): Pt will bathe at sink, seated and standing supported, with setup assist OT Short Term Goal 2 (Week 1): Pt will demo ability to use AE as needed to extend reach during lower body dressing OT Short Term Goal 3 (Week 1): Pt will demonstrate ability to apply 3 principles of energy conservation during performance of BADL OT Short Term Goal 4 (Week 1): Pt will maintain dynamic standing balance supported at [facility] for 5 min during functional task with supervision.  Skilled Therapeutic Interventions/Progress Updates: ADL-retraining with focus on pain management, bed mobility, reinforcement training with use of AE (leg lifter, reacher).   Pt received supine in bed, HOB elevated and sleeping with television on,   With tactile cues pt woke up and verbalized (unintelligibly).   OT repositioned and turned off television remote placed on pt's left upper thigh to attend to pt's conversation however movement and pressure from device startled and provoked most severe pain.   Pt required extra time to recover and was encouraged to transfer out of bed to allow change in linens.   Patient initially complied, using leg lifter and reacher to remove sheets and lift his left leg but ceased movement due to pain and fatigue and requested termination of session.    Pt declared therapeutic interventions attempted as unproductive and only provoking more pain.  Session terminated at pt's request; PA and RN alerted to continued decline in function and poor tolerance to activity out of bed or in bed.    OT recommended change therapy schedule to QD due to multiple refusals and limited pain tolerance.    Pt anticipates discharge to his sister's home in [place] on  Saturday.     Therapy Documentation Precautions:  Precautions Precautions: Fall Restrictions Weight Bearing Restrictions: Yes LLE Weight Bearing: Weight bearing as tolerated   General: General OT Amount of Missed Time: 40 Minutes  Pain: Pain Assessment Pain Assessment: 0-10 Pain Score: 10-Worst pain ever Pain Type: Acute pain Pain Location: Hip Pain Orientation: Left Pain Descriptors / Indicators: Aching Pain Frequency: Occasional Pain Onset: Gradual Patients Stated Pain Goal: 4 Pain Intervention(s): Medication (See eMAR);Repositioned;Emotional support (oxycodone '15mg'$  po)   ADL: ADL ADL Comments: see functional navigator  See Function Navigator for Current Functional Status.   Therapy/Group: Individual Therapy  [place] 12/25/2015, 11:38 AM

## 2015-12-25 NOTE — Consult Note (Signed)
Consultation Note Date: 12/25/2015   Patient Name: Marvin Hardy  DOB: Nov 02, 1960  MRN: [id number]  Age / Sex: 55 y.o., male  PCP: No Pcp Per Patient Referring Physician: Jamse Arn, MD  Reason for Consultation: Establishing goals of care  HPI/Patient Profile: 55 y.o. male  with past medical history of Tobacco abuse and recent diagnosis of metastatic adenocarcinoma admitted on 12/16/2015 with pathologic fracture of the left leg as well as 2.5 x 3.6 right lower lobe lung mass bilateral adrenal masses and large mass medial to the left pterygoid consistent with metastasis.  He has become increasingly frustrated and has been documented throughout chart to be saying he does not trust current care he has been receiving (accusing staff of lying to him) and cannot wait to be discharged this weekend.  Clinical Assessment and Goals of Care: I met today with Marvin Hardy. He laid throughout most of the encounter with his eyes closed and not fully participate in conversation. He did open his eyes long enough to tell me that he wants to be discharged from the hospital as planned on Saturday. At that time he is planning on moving to [place] where he has multiple family members including his sister with whom he is going to live. He states his family is working to arrange for an Materials engineer to see him once he arrives in [place]. He does want to make this appointment for follow-up in order to discuss further options and prognosis moving forward with his newly diagnosed adenocarcinoma.  SUMMARY OF RECOMMENDATIONS   - Very limited interaction due to patient's unwillingness to participate in conversation. - He did not want to participate in further advance care planning nor did we discuss CODE STATUS.  He laid throughout most of the encounter with his eyes closed and would not answer most questions or participate to any meaningful  extent in conversation. - He was clear that his goal is to be discharged from the hospital, transition to live with his sister in [place], and follow with oncologist there to discuss prognosis as well as options for his newly diagnosed adenocarcinoma. - I advised him that with metastatic disease, this is not something that is going to be cured and that the focus of his care moving forward should be on interventions that we will add time or quality to his life. - He did not wish to discuss further with me.  Code Status/Advance Care Planning:  Full code   Symptom Management:   Pain: Reports pain is better as he has been taking his medication as prescribed.  Palliative Prophylaxis:   Aspiration, Bowel Regimen, Delirium Protocol and Frequent Pain Assessment  Additional Recommendations (Limitations, Scope, Preferences):  Full Scope Treatment  Psycho-social/Spiritual:   Desire for further Chaplaincy support:no  Additional Recommendations: Caregiving  Support/Resources  Prognosis:   Unable to determine at this time. However, he has metastatic disease.  If he does not pursue further disease modifying therapy, I suspect his prognosis would be less than 6 months he would likely qualify for  hospice support if so desired in the future. We did not discuss this today as he reports his plan is to follow-up with oncology once he arrives in [place].  Discharge Planning: Planning for transition to his sister's house in [place] with oncologic follow-up there      Primary Diagnoses: Present on Admission:  . Adenocarcinoma ([place])  I have reviewed the medical record, interviewed the patient and family, and examined the patient. The following aspects are pertinent.  Past Medical History  Diagnosis Date  . Adenocarcinoma ([place]) 12/16/2015   Social History   Social History  . Marital Status: Single    Spouse Name: N/A  . Number of Children: N/A  . Years of Education: N/A   Social History  Main Topics  . Smoking status: Current Every Day Smoker    Types: Cigars  . Smokeless tobacco: Not on file     Comment: Smoking 1 cigar per day for the past 25 yrs  . Alcohol Use: 0.0 oz/week    0 Standard drinks or equivalent per week     Comment: Beer on weekends (patient is not sure how much)  . Drug Use: No  . Sexual Activity: Not on file   Other Topics Concern  . Not on file   Social History Narrative   Family History  Problem Relation Age of Onset  . Hypertension Mother   . Heart disease Mother   . Diabetes Mother   . Lung cancer Father    Scheduled Meds: . docusate sodium  100 mg Oral BID  . FLUoxetine  10 mg Oral Daily  . lidocaine  2 patch Transdermal Q24H  . oxyCODONE  40 mg Oral Q12H  . pantoprazole  40 mg Oral Daily  . polyethylene glycol  17 g Oral Daily  . protein supplement  1 scoop Oral TID WC  . senna-docusate  1 tablet Oral BID  . sodium chloride  1 g Oral TID WC   Continuous Infusions:  PRN Meds:.acetaminophen, ALPRAZolam, bisacodyl, gi cocktail, methocarbamol, MUSCLE RUB, ondansetron **OR** ondansetron (ZOFRAN) IV, oxyCODONE, sorbitol Medications Prior to Admission:  Prior to Admission medications   Medication Sig Start Date End Date Taking? Authorizing Provider  acetaminophen (TYLENOL) 500 MG tablet Take 1 tablet (500 mg total) by mouth every 6 (six) hours as needed. Patient taking differently: Take 500 mg by mouth every 6 (six) hours as needed for moderate pain.  09/04/15  Yes Kayla Rose, PA-C  docusate sodium (COLACE) 100 MG capsule Take 1 capsule (100 mg total) by mouth 2 (two) times daily. 12/15/15  Yes Burgess Estelle, MD  ibuprofen (ADVIL,MOTRIN) 800 MG tablet Take 1 tablet (800 mg total) by mouth every 8 (eight) hours as needed for moderate pain. 12/15/15  Yes Milagros Loll, MD  senna-docusate (SENOKOT-S) 8.6-50 MG tablet Take 1 tablet by mouth at bedtime. 12/15/15  Yes Burgess Estelle, MD  ALPRAZolam Duanne Moron) 0.5 MG tablet Take 1 tablet (0.5 mg total)  by mouth 3 (three) times daily as needed for anxiety. 12/25/15   Lavon Paganini Angiulli, PA-C  alum & mag hydroxide-simeth (MAALOX/MYLANTA) 200-200-20 MG/5ML suspension Take 30 mLs by mouth every 4 (four) hours as needed for indigestion. Patient not taking: Reported on 12/16/2015 12/15/15   Burgess Estelle, MD  bisacodyl (DULCOLAX) 10 MG suppository Place 1 suppository (10 mg total) rectally daily. Patient not taking: Reported on 12/16/2015 12/15/15   Burgess Estelle, MD  FLUoxetine (PROZAC) 10 MG capsule Take 1 capsule (10 mg total) by mouth daily. 12/25/15  Lavon Paganini Angiulli, PA-C  lidocaine (LIDODERM) 5 % Place 2 patches onto the skin daily. Remove & Discard patch within 12 hours or as directed by MD 12/25/15   Lavon Paganini Angiulli, PA-C  methocarbamol (ROBAXIN) 500 MG tablet Take 1 tablet (500 mg total) by mouth every 6 (six) hours as needed for muscle spasms. 12/25/15   Lavon Paganini Angiulli, PA-C  oxyCODONE (OXYCONTIN) 40 mg 12 hr tablet Take 1 tablet (40 mg total) by mouth every 12 (twelve) hours. Patient not taking: Reported on 12/16/2015 12/15/15   Burgess Estelle, MD  oxyCODONE (OXYCONTIN) 40 mg 12 hr tablet Take 1 tablet (40 mg total) by mouth every 12 (twelve) hours. 12/25/15   Lavon Paganini Angiulli, PA-C  oxyCODONE 10 MG TABS Take 1-1.5 tablets (10-15 mg total) by mouth every 4 (four) hours as needed for severe pain ((for MODERATE breakthrough pain)). Patient not taking: Reported on 12/16/2015 12/15/15   Burgess Estelle, MD  oxyCODONE 10 MG TABS Take 1-1.5 tablets (10-15 mg total) by mouth every 4 (four) hours as needed for severe pain ((for MODERATE breakthrough pain)). 12/25/15   Lavon Paganini Angiulli, PA-C  pantoprazole (PROTONIX) 40 MG tablet Take 1 tablet (40 mg total) by mouth daily. 12/25/15   Lavon Paganini Angiulli, PA-C  polyethylene glycol (MIRALAX / GLYCOLAX) packet Take 17 g by mouth daily. Patient not taking: Reported on 12/16/2015 12/15/15   Burgess Estelle, MD  polyethylene glycol (MIRALAX / GLYCOLAX) packet Take 17 g by  mouth daily. 12/25/15   Lavon Paganini Angiulli, PA-C   Allergies  Allergen Reactions  . Aleve [Naproxen Sodium] Itching        Review of Systems  Physical Exam  General: Frail male lying in bed. Appears older than stated age. Significant muscular wasting including temporal wasting and orbital wasting noted.Marland Kitchen  HEENT: No bruits, no goiter, no JVD Heart: Regular rate and rhythm. No murmur appreciated. Lungs: Good air movement, clear Abdomen: Soft, nontender, nondistended, positive bowel sounds. Nodule noted on chest wall Ext: No significant edema Skin: Warm and dry Neuro: Grossly intact, nonfocal. Lies throughout most encounter with eyes closed and will not participate in conversation.  Vital Signs: BP 115/74 mmHg  Pulse 83  Temp(Src) 98.3 F (36.8 C) (Oral)  Resp 18  Ht 6' 6"  (1.981 m)  Wt 71.9 kg (158 lb 8.2 oz)  BMI 18.32 kg/m2  SpO2 99% Pain Assessment: 0-10   Pain Score: 10-Worst pain ever   SpO2: SpO2: 99 % O2 Device:SpO2: 99 % O2 Flow Rate: .   IO: Intake/output summary:  Intake/Output Summary (Last 24 hours) at 12/25/15 1413 Last data filed at 12/25/15 1238  Gross per 24 hour  Intake      0 ml  Output    900 ml  Net   -900 ml    LBM: Last BM Date: 12/20/15 (attempted sorbitol) Baseline Weight: Weight: 73.12 kg (161 lb 3.2 oz) Most recent weight: Weight: 71.9 kg (158 lb 8.2 oz)     Palliative Assessment/Data:   Flowsheet Rows        Most Recent Value   Intake Tab    Referral Department  -- [physical medicine]   Unit at Time of Referral  Other (Comment) [[facility]]   Palliative Care Primary Diagnosis  Cancer   Date Notified  12/25/15   Palliative Care Type  New Palliative care   Reason for referral  Clarify Goals of Care   Date of Admission  12/16/15   Date first seen by Palliative Care  12/25/15   # of days Palliative referral response time  0 Day(s)   # of days IP prior to Palliative referral  9   Clinical Assessment    Palliative Performance Scale Score   50%   Pain Max last 24 hours  8   Pain Min Last 24 hours  2   Psychosocial & Spiritual Assessment    Palliative Care Outcomes    Patient/Family meeting held?  Yes   Who was at the meeting?  Patient   Palliative Care Outcomes  Clarified goals of care      Time In: 1230 Time Out: 1325 Time Total: 55 Greater than 50%  of this time was spent counseling and coordinating care related to the above assessment and plan.  Signed by: Micheline Rough, MD   Please contact Palliative Medicine Team phone at [phone] for questions and concerns.  For individual provider: See Shea Evans

## 2015-12-25 NOTE — Progress Notes (Signed)
Occupational Therapy Note  Patient Details  Name: Macallister Ashmead MRN: [id number] Date of Birth: Nov 28, 1960  Today's Date: 12/25/2015 OT Missed Time: 70 Minutes Missed Time Reason: Pain;Patient fatigue;X-Ray  Pt at first was agreeable to practicing a car transfer in prep for d/c on Sat however pt took so long to initiate coming to EOB (never did) and then x ray came for imaging unable to complete session.    Willeen Cass [facility] 12/25/2015, 3:41 PM

## 2015-12-25 NOTE — Progress Notes (Signed)
Patient continues to refuse dressing change to right thigh and hip. Dressing clean and dry. Offered pain meds every 4-6 hours . Patient will take at times. Continue to provide emotional support as patient allows. Mliss Sax

## 2015-12-25 NOTE — Consult Note (Signed)
  Neuropsychology Psychosocial - Confidential [facility]   Mr. Marvin Hardy is a 55 year old man, who was previously seen for a psychodiagnostic evaluation to assess for potential mood disruption in the setting of metastatic cancer.  During that visit, he disclosed having symptoms of depression that were thought to be representative of an adjustment disorder with depressed mood.  A follow-up with the neuropsychologist was suggested to continue to assist with coping prior to discharge.  That was the purpose of today's session.    Today, Marvin Hardy was relatively unengaged with the session.  He kept his eyes closed most of the time and reported feeling the need to "rest."  He also complained of abdominal pain and noted that he had been experiencing marked pain over the past several days with extreme pain most of the day two days ago.  He spent much of today's session expressing his disappointment with his care team; he stated that he felt as though they were keeping information from him and that they were not adequately investigating and treating his pain.  However, he denied depressive symptoms or current concerns.  He stated that he has accepted that he does not know more about his health condition and he is prepared to face whatever comes.  He also said that he does not look back on his life and feel regret and that he continues to feel that he is living each day the way he wants to (e.g. working toward improving his personal relationship with God and being closer emotionally to family members).  Despite his reported acceptance, he noted that he has essentially given up at this point and just wants to go home, though he denied suicidal ideation.  He was not interested in continued participation in therapies while on the unit.  Finally, he stated that he has no interest in eating at this time.    Given Marvin Hardy current emotional and physical state, his care team may  consider requesting consultation with palliative care.  Even though it is close to his discharge date, they may be able to provide him with comfort and help him think through making some decisions about preferences for care going forward (e.g. living will, etc.).  Staff members could continue to approach him to participate in therapies until he is discharged, though given his outlook this morning, he may not engage.    DIAGNOSIS:   Adjustment disorder with depressed mood  Marlane Hatcher, Psy.D.  Clinical Neuropsychologist

## 2015-12-25 NOTE — Progress Notes (Signed)
Physical Therapy Session Note  Patient Details  Name: Marvin Hardy MRN: [id number] Date of Birth: 02-11-1961  Today's Date: 12/25/2015 PT Individual Time: 0901-1000 AND 1400-1445 PT Individual Time Calculation (min): 59 min  AND 45 min   Short Term Goals: Week 1:  PT Short Term Goal 1 (Week 1): short term goals = LTG due to ELOS.   Skilled Therapeutic Interventions/Progress Updates:    Session 1  Patient received supine in bed and agreeable to a limited amount of PT due to pain.  PT instructed patient in Bed mobility with min A to manage LLE during supine to sit transfer as well as mod cues for UE positioning and improved pelvic positioning prior to moving LLE. Heavy use of bed rails noted with bed mobility, even with cues to decrease use of bed rails for transfer.   Patient performed Bed>WC transfer with Supervision A from PT  With min cues for improved positioning and safety of transfer. Mod A required with WC parts management. Including attaching Bilateral ELR and placing pillow under LLE for improved comfort. Patient transported to rehab gym in [facility] for energy conservation.  RN administered meds during therapeutic rest break prior to stair training.   PT instructed patient Stair training on 8, 4 inch steps. Min A progressing to supervision A throughout stair navigation. Mod verbal and visual cues for improved UE placement on  1 rail to simulate access to home environment  And proper step to gait pattern.   UE therex including chest press x 15  And ball tap 2 x 4 minutes with 4 lb bar weight. PT provided min cues for improved ROM and proper breathing technique to prevent increased abdominal pain.    Patient performed WC mobility for 163f without cues from PT in controlled environment, but noted to require increased effort.    Bed transfer with Supervision A. Patient performed bed mob with min A for LLE management.    Session 2   Patient received supine in bed and agreeable to Bed  level exercises, but declined OOB activity initially.    Supine therex.  SLR x 10 RLE; x 8 LLE AAROM Hip abduction/adduction x 10 BLE with min A on LLE for decreased friction on bed.  Ankle PF x 15 BLE with Manual resistance Ankle DF x 15 BLE with manual resistance.  Hip/knee extension on RLE with manual resistance. X 8   PT provided constant verbal and visual instruction for improved ROM and control with eccentric movement.   Patient performed bed mobility for supine to sit with min A for LLE management. Gait training for 223fin room with supervision A from PT. Patient left supine in bed with call bell within reach at end of session.    Therapy Documentation Precautions:  Precautions Precautions: Fall Restrictions Weight Bearing Restrictions: Yes LLE Weight Bearing: Weight bearing as tolerated    Pain: Pain Assessment Pain Assessment: 0-10 Pain Score: 9  Pain Location: Hip Pain Orientation: Left Pain Descriptors / Indicators: Sharp Patients Stated Pain Goal: 6 Pain Intervention(s): Ambulation/increased activity  See Function Navigator for Current Functional Status.   Therapy/Group: Individual Therapy  AuLorie Phenix/25/2017, 9:58 AM

## 2015-12-25 NOTE — Progress Notes (Signed)
[facility] PHYSICAL MEDICINE & REHABILITATION     PROGRESS NOTE  Subjective/Complaints:  Pt laying in bed, being shaved this AM.  He slept well overnight.  He has questions about his xray results.  He states his abdomen feels "okay".   ROS: + TTP left chest wall mass. Denies abd pain, CP, SOB, N/V/D.  Objective: Vital Signs: Blood pressure 115/74, pulse 83, temperature 98.3 F (36.8 C), temperature source Oral, resp. rate 18, height '6\' 6"'$  (1.981 m), weight 71.9 kg (158 lb 8.2 oz), SpO2 99 %. Dg Abd 2 Views  12/23/2015  CLINICAL DATA:  Upper abdominal pain beginning early today. EXAM: ABDOMEN - 2 VIEW COMPARISON:  None. FINDINGS: Nonobstructive bowel gas pattern. No free air organomegaly. No suspicious calcification. Postoperative changes partially imaged in the proximal left femur. IMPRESSION: No acute findings. Electronically Signed   By: Rolm Baptise M.D.   On: 12/23/2015 09:46    Recent Labs  12/23/15 0442 12/25/15 0510  WBC 9.4 16.6*  HGB 8.7* 8.4*  HCT 27.3* 25.2*  PLT 620* 544*    Recent Labs  12/23/15 0442 12/25/15 0510  NA 127* 125*  K 4.3 4.4  CL 89* 88*  GLUCOSE 109* 98  BUN 16 20  CREATININE 0.89 0.83  CALCIUM 9.7 9.6   CBG (last 3)  No results for input(s): GLUCAP in the last 72 hours.  Wt Readings from Last 3 Encounters:  12/24/15 71.9 kg (158 lb 8.2 oz)  12/06/15 73.211 kg (161 lb 6.4 oz)    Physical Exam:  BP 115/74 mmHg  Pulse 83  Temp(Src) 98.3 F (36.8 C) (Oral)  Resp 18  Ht '6\' 6"'$  (1.981 m)  Wt 71.9 kg (158 lb 8.2 oz)  BMI 18.32 kg/m2  SpO2 99% Constitutional: He appears cachetic. NAD. Vital signs reviewed. Head: Normocephalic. Atraumatic.  Eyes: EOM and Conj are normal.  Cardiovascular: Normal rate and regular rhythm. no murmurs rubs,  Respiratory:  Effort normal. No distress.  GI:  Soft. Bowel sounds are normal. He exhibits no distension.  Musc: pain in left hip with PROM of leg. Large nodule left chest wall (stable in size),  muscle wasting diffusely.  Neurological: He is alert and oriented.   Motor: B/l UE: 4+/5 prox, 5/5 distal.  RLE: 4+/5 proximally, 5/5 distally.  LLE: (pain inhibition) 2/5 proximally, 5/5 distally.  Skin:  Incision with staples C/D/I.  Left thigh is swollen and tender no ecchymosis Small nodule posterior scalp without erythema or warmth, nontender, lipoma  Assessment/Plan: 1. 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 Physiatrist is providing close team supervision and 24 hour management of active medical problems listed below. Physiatrist and rehab team continue to assess barriers to discharge/monitor patient progress toward functional and medical goals.  Function:  Bathing Bathing position   Position: Bed  Bathing parts Body parts bathed by patient: Right arm, Left arm, Chest, Abdomen, Front perineal area, Right upper leg, Left upper leg Body parts bathed by helper: Buttocks, Right lower leg, Left lower leg, Back  Bathing assist Assist Level: Touching or steadying assistance(Pt > 75%)      Upper Body Dressing/Undressing Upper body dressing   What is the patient wearing?: Pull over shirt/dress     Pull over shirt/dress - Perfomed by patient: Thread/unthread right sleeve, Thread/unthread left sleeve, Put head through opening Pull over shirt/dress - Perfomed by helper: Pull shirt  over trunk        Upper body assist Assist Level: Touching or steadying assistance(Pt > 75%)   Set up : To obtain clothing/put away  Lower Body Dressing/Undressing Lower body dressing   What is the patient wearing?: Pants Underwear - Performed by patient: Thread/unthread right underwear leg, Thread/unthread left underwear leg, Pull underwear up/down Underwear - Performed by helper: Thread/unthread right underwear leg,  Thread/unthread left underwear leg, Pull underwear up/down Pants- Performed by patient: Thread/unthread right pants leg, Fasten/unfasten pants, Pull pants up/down Pants- Performed by helper: Thread/unthread left pants leg Non-skid slipper socks- Performed by patient: Don/doff right sock, Don/doff left sock     Socks - Performed by helper: Don/doff right sock, Don/doff left sock   Shoes - Performed by helper: Don/doff right shoe, Don/doff left shoe, Fasten right, Fasten left          Lower body assist Assist for lower body dressing: Touching or steadying assistance (Pt > 75%)      Toileting Toileting Toileting activity did not occur: No continent bowel/bladder event Toileting steps completed by patient: Performs perineal hygiene Toileting steps completed by helper: Adjust clothing prior to toileting, Performs perineal hygiene, Adjust clothing after toileting (per Malachi Carl, NT)    Toileting assist     Transfers Chair/bed transfer   Chair/bed transfer method: Squat pivot Chair/bed transfer assist level: Supervision or verbal cues Chair/bed transfer assistive device: Armrests     Locomotion Ambulation     Max distance: 35 ft Assist level: Supervision or verbal cues   Wheelchair   Type: Manual Max wheelchair distance: 150 Assist Level: No help, No cues, assistive device, takes more than reasonable amount of time  Cognition Comprehension Comprehension assist level: Understands complex 90% of the time/cues 10% of the time  Expression Expression assist level: Expresses complex 90% of the time/cues < 10% of the time  Social Interaction Social Interaction assist level: Interacts appropriately with others with medication or extra time (anti-anxiety, antidepressant).  Problem Solving Problem solving assist level: Solves basic 90% of the time/requires cueing < 10% of the time  Memory Memory assist level: Recognizes or recalls 90% of the time/requires cueing < 10% of the time      Medical Problem List and Plan: 1. Limited functional mobility secondary to pathologic left femur fracture/poorly differentiated adenocarcinoma. Status post intramedullary nail placement 12/09/2015.   Weightbearing as tolerated  Continue CIR  No recorded results to date in EHR for final results of biopsy that were sent off for adenocarcinoma   Left chest wall mass stable in size  Left message with Heme/Onc in [place], per pt request, still awaiting call back from physician 2. DVT Prophylaxis/Anticoagulation: SCDs.   Vascular study negative for DVT on 5/17 3. Pain Management: OxyContin sustained release 40 mg every 12 hours, oxycodone and Robaxin as needed.   Monitor with increased mobility, will increase as necessary   -added lidoderm patch to nodule on left chest wall 4. Mood: team to provide ego support. Patient encouraged to be open with team regarding any need for support, counseling, etc 5. Neuropsych: This patient is capable of making decisions on his own behalf. 6. Skin/Wound Care: Routine skin checks 7. Fluids/Electrolytes/Nutrition: encourage po   Hyponatremia: 125 on 5/25, salt tabs ordered  Will order labs for tomorrow 8. Acute blood loss anemia.   Hb 8.4 on 5/25  Hemoccult stools Remain pending 9. Constipation. Laxative assistance 10. Tobacco abuse. Counseling 11. Hypoalbuminemia:  Supplement started on 5/17 12. Posterior scalp lipoma  Stable in size, nontender, no warmth, no erythema  Will continue monitor for changes 13. Leukocytosis: Afebrile  Will order CXR  Will order UA/Ucx   LOS (Days) 9 A FACE TO FACE EVALUATION WAS PERFORMED  Ayvah Caroll Lorie Phenix 12/25/2015 9:24 AM

## 2015-12-25 NOTE — Progress Notes (Signed)
Social Work Patient ID: Marvin Hardy, male   DOB: 02/06/1961, 55 y.o.   MRN: [id number] Met with pt to inform team conference and working with family on discharge plans for Sat. Wanting to make sure all arrangements are made and discharge goes smoothly. He reports his pain is a little better but did not want to discuss it. He closed his eyes when he was finished with this worker.

## 2015-12-26 ENCOUNTER — Inpatient Hospital Stay (HOSPITAL_COMMUNITY): Payer: BLUE CROSS/BLUE SHIELD

## 2015-12-26 ENCOUNTER — Inpatient Hospital Stay (HOSPITAL_COMMUNITY): Payer: BLUE CROSS/BLUE SHIELD | Admitting: Physical Therapy

## 2015-12-26 LAB — CBC WITH DIFFERENTIAL/PLATELET
Basophils Absolute: 0 10*3/uL (ref 0.0–0.1)
Basophils Relative: 0 %
EOS ABS: 0 10*3/uL (ref 0.0–0.7)
EOS PCT: 0 %
HCT: 25.4 % — ABNORMAL LOW (ref 39.0–52.0)
Hemoglobin: 8.1 g/dL — ABNORMAL LOW (ref 13.0–17.0)
LYMPHS ABS: 1.4 10*3/uL (ref 0.7–4.0)
LYMPHS PCT: 11 %
MCH: 30.8 pg (ref 26.0–34.0)
MCHC: 31.9 g/dL (ref 30.0–36.0)
MCV: 96.6 fL (ref 78.0–100.0)
MONO ABS: 0.9 10*3/uL (ref 0.1–1.0)
MONOS PCT: 7 %
Neutro Abs: 10.3 10*3/uL — ABNORMAL HIGH (ref 1.7–7.7)
Neutrophils Relative %: 82 %
PLATELETS: 537 10*3/uL — AB (ref 150–400)
RBC: 2.63 MIL/uL — ABNORMAL LOW (ref 4.22–5.81)
RDW: 13.5 % (ref 11.5–15.5)
WBC: 12.7 10*3/uL — ABNORMAL HIGH (ref 4.0–10.5)

## 2015-12-26 LAB — URINALYSIS, ROUTINE W REFLEX MICROSCOPIC
GLUCOSE, UA: NEGATIVE mg/dL
HGB URINE DIPSTICK: NEGATIVE
Ketones, ur: NEGATIVE mg/dL
Leukocytes, UA: NEGATIVE
Nitrite: NEGATIVE
Protein, ur: NEGATIVE mg/dL
SPECIFIC GRAVITY, URINE: 1.033 — AB (ref 1.005–1.030)
pH: 5.5 (ref 5.0–8.0)

## 2015-12-26 LAB — OCCULT BLOOD X 1 CARD TO LAB, STOOL: FECAL OCCULT BLD: NEGATIVE

## 2015-12-26 LAB — BASIC METABOLIC PANEL
Anion gap: 12 (ref 5–15)
BUN: 22 mg/dL — AB (ref 6–20)
CHLORIDE: 88 mmol/L — AB (ref 101–111)
CO2: 26 mmol/L (ref 22–32)
CREATININE: 0.8 mg/dL (ref 0.61–1.24)
Calcium: 9.7 mg/dL (ref 8.9–10.3)
GFR calc Af Amer: >60 mL/min (ref >60)
GFR calc non Af Amer: >60 mL/min (ref >60)
GLUCOSE: 100 mg/dL — AB (ref 65–99)
POTASSIUM: 4.4 mmol/L (ref 3.5–5.1)
SODIUM: 126 mmol/L — AB (ref 135–145)

## 2015-12-26 MED ORDER — ACETAMINOPHEN 325 MG PO TABS
650.0000 mg | ORAL_TABLET | Freq: Four times a day (QID) | ORAL | Status: DC | PRN
  Administered 2015-12-26: 650 mg via ORAL
  Filled 2015-12-26: qty 2

## 2015-12-26 NOTE — Progress Notes (Signed)
Social Work  Discharge Note  The overall goal for the admission was met for:   Discharge location: Yes-GOING TO SISTER'S HOME IN [place]  Length of Stay: Yes-11 DAYS  Discharge activity level: Yes-SUPERVISION LEVEL  Home/community participation: Yes  Services provided included: MD, RD, PT, OT, RN, CM, Pharmacy, Neuropsych and SW  Financial Services: Private Insurance: [employer] [place]  Follow-up services arranged: Outpatient: [facility] WILL CONTACT YOU TO SET UP APPOINTMENT  Comments (or additional information):ROSE TO COME AND GET PRESCRIPTIONS FILLED PRIOR TO DISCHARGE TOMORROW. [name] HAS ARRANGED A CAR/VAN AND PT'S DAUGHTER-TAMIKA IS COMING TO TRANSPORT TO PT'S SISTER'S HOME IN [place]. PT JUST WANTS TO LEAVE HERE AND GET TO [place] AND TO SEE ONCOLOIGIST SO HE CAN OBTAIN SOME ANSWERS ABOUT HIS CANCER. HE IS AWARE DR PATEL HAS MADE MULTIPLE ATTEMPTS TO GET HIM A APPOINTMENT WITH DR Wende Crease, FAMILY ALSO PURSUING THIS. GIVEN COPY OF HIS MEDICAL RECORD TO TAKE DOWN THERE. [facility] HAS GOTTEN EQUIPMENT FOR HIM TO USE-WHEELCHAIR AND ROLLING WALKER. [place]-PT WORKING WITH HIM HERE WITH DO CAR TRANSFER SAT WHEN FAMILY HERE TO TAKE TO [place].  Patient/Family verbalized understanding of follow-up arrangements: Yes  Individual responsible for coordination of the follow-up plan: SELF, Marvin Hardy?  Confirmed correct DME delivered: Marvin Hardy 12/26/2015    Marvin Hardy

## 2015-12-26 NOTE — Progress Notes (Signed)
[facility] PHYSICAL MEDICINE & REHABILITATION     PROGRESS NOTE  Subjective/Complaints:  Patient sitting up in bed this morning. He states he slept well last night. He also states he feels much better after having a good BM this morning.  ROS: + TTP left chest wall mass. Denies abd pain, CP, SOB, N/V/D.  Objective: Vital Signs: Blood pressure 114/69, pulse 81, temperature 99.5 F (37.5 C), temperature source Oral, resp. rate 18, height '6\' 6"'$  (1.981 m), weight 71.9 kg (158 lb 8.2 oz), SpO2 94 %. Dg Chest Port 1 View  12/25/2015  CLINICAL DATA:  Leukocytosis. EXAM: PORTABLE CHEST 1 VIEW COMPARISON:  None. FINDINGS: Cardiomediastinal silhouette is normal. Mediastinal contours appear intact. There is no evidence of pleural effusion or pneumothorax. 2.7 cm rounded opacity in the right lung base likely corresponds to previously demonstrated by CT pulmonary mass, un chest abdomen and pelvis CT dated 12/06/2015. There is straightening of the left heart border, usually associated with left lower lobe atelectasis. Mild emphysematous changes of the lungs are noted. Osseous structures are without acute abnormality. Soft tissues are grossly normal. IMPRESSION: No evidence of acute pulmonary consolidation. Right lower lobe pulmonary mass, highly suspicious for primary malignancy. Probable left lower lobe atelectasis. Electronically Signed   By: Fidela Salisbury M.D.   On: 12/25/2015 14:04    Recent Labs  12/25/15 0510 12/26/15 0410  WBC 16.6* 12.7*  HGB 8.4* 8.1*  HCT 25.2* 25.4*  PLT 544* 537*    Recent Labs  12/25/15 0510 12/26/15 0410  NA 125* 126*  K 4.4 4.4  CL 88* 88*  GLUCOSE 98 100*  BUN 20 22*  CREATININE 0.83 0.80  CALCIUM 9.6 9.7   CBG (last 3)  No results for input(s): GLUCAP in the last 72 hours.  Wt Readings from Last 3 Encounters:  12/24/15 71.9 kg (158 lb 8.2 oz)  12/06/15 73.211 kg (161 lb 6.4 oz)    Physical Exam:  BP 114/69 mmHg  Pulse 81  Temp(Src) 99.5 F  (37.5 C) (Oral)  Resp 18  Ht '6\' 6"'$  (1.981 m)  Wt 71.9 kg (158 lb 8.2 oz)  BMI 18.32 kg/m2  SpO2 94% Constitutional: He appears cachetic. NAD. Vital signs reviewed. Head: Normocephalic. Atraumatic.  Eyes: EOM and Conj are normal.  Cardiovascular: Normal rate and regular rhythm. no murmurs rubs,  Respiratory:  Effort normal. No distress.  GI:  Soft. Bowel sounds are normal. He exhibits no distension.  Musc: pain in left hip with PROM of leg. Large nodule left chest wall (stable in size), muscle wasting diffusely.  Neurological: He is alert and oriented.   Motor: B/l UE: 4+/5 prox, 5/5 distal.  RLE: 4+/5 proximally, 5/5 distally.  LLE: (pain inhibition) 2/5 proximally, 5/5 distally.  Skin:  Incision with staples C/D/I.  Left thigh is swollen and tender no ecchymosis Small nodule posterior scalp without erythema or warmth, nontender, lipoma  Assessment/Plan: 1. 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 Physiatrist is providing close team supervision and 24 hour management of active medical problems listed below. Physiatrist and rehab team continue to assess barriers to discharge/monitor patient progress toward functional and medical goals.  Function:  Bathing Bathing position   Position: Bed  Bathing parts Body parts bathed by patient: Right arm, Left arm, Chest, Abdomen, Front perineal area, Right upper leg, Left upper leg Body parts bathed by helper:  Buttocks, Right lower leg, Left lower leg, Back  Bathing assist Assist Level: Touching or steadying assistance(Pt > 75%)      Upper Body Dressing/Undressing Upper body dressing   What is the patient wearing?: Pull over shirt/dress     Pull over shirt/dress - Perfomed by patient: Thread/unthread right sleeve, Thread/unthread left sleeve, Put  head through opening Pull over shirt/dress - Perfomed by helper: Pull shirt over trunk        Upper body assist Assist Level: Touching or steadying assistance(Pt > 75%)   Set up : To obtain clothing/put away  Lower Body Dressing/Undressing Lower body dressing   What is the patient wearing?: Pants Underwear - Performed by patient: Thread/unthread right underwear leg, Thread/unthread left underwear leg, Pull underwear up/down Underwear - Performed by helper: Thread/unthread right underwear leg, Thread/unthread left underwear leg, Pull underwear up/down Pants- Performed by patient: Thread/unthread right pants leg, Fasten/unfasten pants, Pull pants up/down Pants- Performed by helper: Thread/unthread left pants leg Non-skid slipper socks- Performed by patient: Don/doff right sock, Don/doff left sock     Socks - Performed by helper: Don/doff right sock, Don/doff left sock   Shoes - Performed by helper: Don/doff right shoe, Don/doff left shoe, Fasten right, Fasten left          Lower body assist Assist for lower body dressing: Touching or steadying assistance (Pt > 75%)      Toileting Toileting Toileting activity did not occur: No continent bowel/bladder event Toileting steps completed by patient: Performs perineal hygiene Toileting steps completed by helper: Adjust clothing prior to toileting, Performs perineal hygiene, Adjust clothing after toileting (per Malachi Carl, NT)    Toileting assist     Transfers Chair/bed transfer   Chair/bed transfer method: Squat pivot Chair/bed transfer assist level: Supervision or verbal cues Chair/bed transfer assistive device: Armrests     Locomotion Ambulation     Max distance: 91f Assist level: Supervision or verbal cues   Wheelchair   Type: Manual Max wheelchair distance: 1539fAssist Level: No help, No cues, assistive device, takes more than reasonable amount of time  Cognition Comprehension Comprehension assist level:  Understands complex 90% of the time/cues 10% of the time  Expression Expression assist level: Expresses complex 90% of the time/cues < 10% of the time  Social Interaction Social Interaction assist level: Interacts appropriately with others with medication or extra time (anti-anxiety, antidepressant).  Problem Solving Problem solving assist level: Solves basic 90% of the time/requires cueing < 10% of the time  Memory Memory assist level: Recognizes or recalls 90% of the time/requires cueing < 10% of the time     Medical Problem List and Plan: 1. 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 Status post intramedullary nail placement 12/09/2015.   Weightbearing as tolerated  Continue CIR, plan for DC tomorrow  No recorded results to date in EHR for final results of biopsy that were sent off for adenocarcinoma   Left chest wall mass stable in size  Left message with Heme/Onc in [place], per pt request, still awaiting call back from physician 2. DVT Prophylaxis/Anticoagulation: SCDs.   Vascular study negative for DVT on 5/17 3. Pain Management: OxyContin sustained release 40 mg every 12 hours, oxycodone and Robaxin as needed.   Monitor with increased mobility, will increase as necessary   -added lidoderm patch posterior to nodule on left chest wall 4. Mood: team to provide ego support. Patient encouraged to be open with team regarding any need for  support, counseling, etc 5. Neuropsych: This patient is capable of making decisions on his own behalf. 6. Skin/Wound Care: Routine skin checks 7. Fluids/Electrolytes/Nutrition: encourage po   Hyponatremia: 126 on 5/26, stable, salt tabs ordered on 5/25  Likely secondary to CA. 8. Acute blood loss anemia.   Hb 8.4 on 5/25  Hemoccult stools Remain pending 9. Constipation. Laxative assistance 10. Tobacco abuse. Counseling 11. Hypoalbuminemia:  Supplement started on  5/17 12. Posterior scalp lipoma  Stable in size, nontender, no warmth, no erythema  Will continue monitor for changes 13. Leukocytosis: Afebrile  Chest x-ray from 5/25 unremarkable for acute process  UA from 5/26 relatively unremarkable, urine culture pending   LOS (Days) 10 A FACE TO FACE EVALUATION WAS PERFORMED  Ankit Lorie Phenix 12/26/2015 9:58 AM

## 2015-12-26 NOTE — Discharge Instructions (Signed)
Inpatient Rehab Discharge Instructions  Marvin Hardy Discharge date and time: No discharge date for patient encounter.   Activities/Precautions/ Functional Status: Activity: activity as tolerated Diet: regular diet Wound Care: keep wound clean and dry Functional status:  ___ No restrictions     ___ Walk up steps independently ___ 24/7 supervision/assistance   ___ Walk up steps with assistance ___ Intermittent supervision/assistance  ___ Bathe/dress independently ___ Walk with walker     _x__ Bathe/dress with assistance ___ Walk Independently    ___ Shower independently ___ Walk with assistance    ___ Shower with assistance ___ No alcohol     ___ Return to work/school ________  Special Instructions:  Follow up in [place] concerning oncology and orthopedic services   COMMUNITY REFERRALS UPON DISCHARGE:      Outpatient: PT  Agency:[facility] Phone:[phone]   Date of Last Service:12/27/2015  Appointment Date/Time:WILL CONTACT YOU TO SCHEDULE APPOINTMENT  Medical Equipment/Items Ordered:HAS BORROWED EQUIPMENT-WHEELCHAIR AND ROLLING WALKER   Other:MD MADE MULTIPLE ATEMPTS TO SET UP PT WITH ONCOLOGIST-DR JAMI IN [place] FAMILY TO FOLLOW UP WITH THIS DISABILITY APPLICATION GIVEN TO PATIENT TO COMPLETE  My questions have been answered and I understand these instructions. I will adhere to these goals and the provided educational materials after my discharge from the hospital.  Patient/Caregiver Signature _______________________________ Date __________  Clinician Signature _______________________________________ Date __________  Please bring this form and your medication list with you to all your follow-up doctor's appointments.

## 2015-12-26 NOTE — Progress Notes (Signed)
Physical Therapy Session Note  Patient Details  Name: Marvin Hardy MRN: [id number] Date of Birth: August 07, 1960  Today's Date: 12/26/2015 PT Individual Time: 8768-1157 PT Individual Time Calculation (min): 60 min   Short Term Goals: Week 1:  PT Short Term Goal 1 (Week 1): short term goals = LTG due to ELOS.   Skilled Therapeutic Interventions/Progress Updates:    Bed mob for sit<>supine with min A from PT to manage LLE. Patient required min cues for UE placement for improved pelvic movement to come to sitting EOB. SUpervision A with squat pivot transfer for WC<>bed with min cues for UE and LLE placement for improved safety of transfer.  WC mobility without cues from PT, 146f x 2. Patient noted to require increased time and effort with doorway management.  Car transfer with min A from PT to manage LLE. PT provided mod verbal instruction with UE placement and improved technique to decrease pain on LLE and improve ease of transfer into car.  WC mob in gift shop x 50 ft. Min A from PT for obstacle navigation due to increased WC size with LLE elevated.   PT [place] due to endurance deficits and increase pain in LLE. Patient stated that he preferred to travel in sister's [place].  Patient returned to bed and left supine with call bell within reach.   Therapy Documentation Precautions:  Precautions Precautions: Fall Restrictions Weight Bearing Restrictions: Yes LLE Weight Bearing: Weight bearing as tolerated General:   Vital Signs: Therapy Vitals Temp: 98.5 F (36.9 C) Temp Source: Oral Pulse Rate: 85 Resp: 18 BP: 112/71 mmHg Patient Position (if appropriate): Lying Oxygen Therapy SpO2: 99 % O2 Device: Not Delivered Pain: Pain Assessment Pain Score: 2   See Function Navigator for Current Functional Status.   Therapy/Group: Individual Therapy  ALorie Phenix5/26/2017, 6:40 PM

## 2015-12-26 NOTE — Progress Notes (Signed)
Occupational Therapy Discharge Summary  Patient Details  Name: Marvin Hardy MRN: [id number] Date of Birth: 08/29/1960  Patient has met 4 of 10 long term goals due to ability to compensate for deficits and improved awareness.  Patient to discharge at [facility] Assist level.  Patient's care partner is independent to provide the necessary physical assistance at discharge.    Reasons goals not met: Decline in medical status relating to suspected progression of cancer.  Pt unable to participate fully due to new onset of pain at abdomin.  Recommendation:  Patient will benefit from ongoing skilled OT services in home health setting to continue to advance functional skills in the area of Reduce care partner burden.  Equipment: No equipment provided (patient reports access to [facility])  Reasons for discharge: discharge from hospital  Patient/family agrees with progress made and goals achieved: Yes  OT Discharge Precautions/Restrictions  Precautions Precautions: Fall Restrictions Weight Bearing Restrictions: Yes LLE Weight Bearing: Weight bearing as tolerated  Pain Pain Assessment Pain Assessment: 0-10 Pain Score: 10-Worst pain ever Pain Type: Acute pain Pain Location: Abdomen Pain Orientation: Mid Pain Descriptors / Indicators: Cramping;Jabbing Pain Onset: Unable to tell Multiple Pain Sites: Yes 2nd Pain Site Pain Score: 8 Pain Type: Acute pain Pain Location: Leg Pain Orientation: Left;Upper;Lateral Pain Descriptors / Indicators: Operative site guarding Pain Onset: With Activity Pain Intervention(s): Repositioned;Elevated extremity;Distraction;Emotional support   ADL ADL ADL Comments: see Functional Assessment Tool   Vision/Perception  Vision- History Baseline Vision/History: No visual deficits Patient Visual Report: No change from baseline Vision- Assessment Vision Assessment?: No apparent visual deficits   Cognition Overall Cognitive Status: Within Functional Limits  for tasks assessed Arousal/Alertness: Awake/alert Orientation Level: Oriented X4 Attention: Alternating Alternating Attention: Appears intact Memory: Appears intact Awareness: Appears intact Problem Solving: Appears intact Behaviors: Other (comment) Safety/Judgment: Appears intact Comments: Deterioration of patience with staff d/t pain   Sensation Sensation Light Touch: Appears Intact Stereognosis: Appears Intact Hot/Cold: Appears Intact Proprioception: Appears Intact Coordination Gross Motor Movements are Fluid and Coordinated: No Fine Motor Movements are Fluid and Coordinated: Yes Coordination and Movement Description: difficulty with coordination in left leg due to pain  Motor  Motor Motor: Within Functional Limits Motor - Skilled Clinical Observations: generalized weakness and continued acute pain   Mobility  Bed Mobility Bed Mobility: Rolling Right;Rolling Left;Supine to Sit;Sit to Supine Rolling Right: 4: Min guard Rolling Left: 4: Min guard Supine to Sit: 4: Min assist Supine to Sit Details: Verbal cues for sequencing;Verbal cues for technique;Verbal cues for precautions/safety;Visual cues/gestures for sequencing;Tactile cues for weight shifting;Tactile cues for placement;Verbal cues for safe use of DME/AE;Manual facilitation for weight shifting;Manual facilitation for placement Sit to Supine: 4: Min assist Sit to Supine - Details: Verbal cues for sequencing;Verbal cues for technique;Verbal cues for precautions/safety;Verbal cues for safe use of DME/AE;Manual facilitation for weight shifting;Tactile cues for weight shifting;Manual facilitation for placement;Manual facilitation for weight bearing Transfers Transfers: Sit to Stand;Stand to Sit Sit to Stand: 5: Supervision Sit to Stand Details: Verbal cues for sequencing;Verbal cues for technique;Verbal cues for precautions/safety;Verbal cues for safe use of DME/AE Stand to Sit: 5: Supervision Stand to Sit Details  (indicate cue type and reason): Verbal cues for sequencing;Verbal cues for precautions/safety;Verbal cues for technique;Verbal cues for gait pattern;Verbal cues for safe use of DME/AE   Trunk/Postural Assessment  Cervical Assessment Cervical Assessment: Within Functional Limits Thoracic Assessment Thoracic Assessment: Within Functional Limits Lumbar Assessment Lumbar Assessment: Within Functional Limits Postural Control Postural Control: Within Functional Limits   Balance Balance  Balance Assessed: Yes Dynamic Sitting Balance Dynamic Sitting - Balance Support: During functional activity;Bilateral upper extremity supported Dynamic Sitting - Level of Assistance: 6: Modified independent (Device/Increase time) Static Standing Balance Static Standing - Balance Support: During functional activity;No upper extremity supported Static Standing - Level of Assistance: 5: Stand by assistance Dynamic Standing Balance Dynamic Standing - Balance Support: During functional activity;Bilateral upper extremity supported Dynamic Standing - Level of Assistance: 4: Min assist Dynamic Standing - Balance Activities: Lateral lean/weight shifting;Reaching for objects   Extremity/Trunk Assessment RUE Assessment RUE Assessment: Within Functional Limits LUE Assessment LUE Assessment: Within Functional Limits   See Function Navigator for Current Functional Status.  [facility] 12/28/2015, 11:33 AM

## 2015-12-26 NOTE — Progress Notes (Signed)
Orthopedic Tech Progress Note Patient Details:  Marvin Hardy 1960/09/10 [id number]  Ortho Devices Type of Ortho Device: Knee Immobilizer Ortho Device/Splint Interventions: Application   Marvin Hardy 12/26/2015, 4:36 PM

## 2015-12-26 NOTE — Progress Notes (Signed)
Occupational Therapy Session Note  Patient Details  Name: Marvin Hardy MRN: [id number] Date of Birth: 07-31-1961  Today's Date: 12/26/2015 OT Individual Time: 1045-1130 OT Individual Time Calculation (min): 45 min   Short Term Goals: Week 1:  OT Short Term Goal 1 (Week 1): Pt will bathe at sink, seated and standing supported, with setup assist OT Short Term Goal 2 (Week 1): Pt will demo ability to use AE as needed to extend reach during lower body dressing OT Short Term Goal 3 (Week 1): Pt will demonstrate ability to apply 3 principles of energy conservation during performance of BADL OT Short Term Goal 4 (Week 1): Pt will maintain dynamic standing balance supported at [facility] for 5 min during functional task with supervision.  Skilled Therapeutic Interventions/Progress Updates: ADL-retraining at bed level with focus on pain management, bed mobility, adapted dressing, re-ed on use of AE (reacher, leg lifter), and discharge planning.   Pt received supine in bed, alert and receptive for out-of-bed activity recommended by OT.   Initial plan was to attempt car transfer again.    Pt requested clothing for comfort d/t report of poor thermoregulation (cold).    Pt dressed upper body with only setup assist but required assist managing left leg to don shorts.    Pt related that assist provided from multiple caregivers provokes pain when left leg is "jostled" and bumped.   OT educated pt on use of orthotics to manage pain particularly during travel to his sister's house in [place].    OT provided knee-immobilizer trial which helped some but pt stated he would prefer more movement at his knee.   OT offered to relay possible benefit of hinged knee brace to PT and PA.    During conversation, OT advised pt on proximity to [place] from [place], [place] (sister's home), with anecdotal evidence of benefit of medicinal marijuana from this writer's experience with loss of his mother to cancer, 07/2015.   Pt received info and  expressed interest and support.   OT offered to relay info to SW for resources if feasible.   Pt left in bed at end of session with all needs within reach.     Therapy Documentation Precautions:  Precautions Precautions: Fall Restrictions Weight Bearing Restrictions: Yes LLE Weight Bearing: Weight bearing as tolerated  Pain: Pain Assessment Pain Assessment: 0-10 Pain Score: 7  Pain Type: Acute pain Pain Location: Leg Pain Orientation: Left;Upper Pain Intervention(s): Repositioned;Distraction;Emotional support  ADL: ADL ADL Comments: see functional navigator  See Function Navigator for Current Functional Status.   Therapy/Group: Individual Therapy  [place] 12/26/2015, 11:51 AM

## 2015-12-27 ENCOUNTER — Inpatient Hospital Stay (HOSPITAL_COMMUNITY): Payer: BLUE CROSS/BLUE SHIELD | Admitting: Physical Therapy

## 2015-12-27 LAB — URINE CULTURE: CULTURE: NO GROWTH

## 2015-12-27 NOTE — Progress Notes (Signed)
[facility] PHYSICAL MEDICINE & REHABILITATION     PROGRESS NOTE  Subjective/Complaints:  Feels ok, Left hip staples removed yesterday  ROS:  Denies abd pain, CP, SOB, N/V/D.  Objective: Vital Signs: Blood pressure 109/68, pulse 82, temperature 98.2 F (36.8 C), temperature source Oral, resp. rate 17, height '6\' 6"'$  (1.981 m), weight 71.9 kg (158 lb 8.2 oz), SpO2 98 %. Dg Chest Port 1 View  12/25/2015  CLINICAL DATA:  Leukocytosis. EXAM: PORTABLE CHEST 1 VIEW COMPARISON:  None. FINDINGS: Cardiomediastinal silhouette is normal. Mediastinal contours appear intact. There is no evidence of pleural effusion or pneumothorax. 2.7 cm rounded opacity in the right lung base likely corresponds to previously demonstrated by CT pulmonary mass, un chest abdomen and pelvis CT dated 12/06/2015. There is straightening of the left heart border, usually associated with left lower lobe atelectasis. Mild emphysematous changes of the lungs are noted. Osseous structures are without acute abnormality. Soft tissues are grossly normal. IMPRESSION: No evidence of acute pulmonary consolidation. Right lower lobe pulmonary mass, highly suspicious for primary malignancy. Probable left lower lobe atelectasis. Electronically Signed   By: Fidela Salisbury M.D.   On: 12/25/2015 14:04    Recent Labs  12/25/15 0510 12/26/15 0410  WBC 16.6* 12.7*  HGB 8.4* 8.1*  HCT 25.2* 25.4*  PLT 544* 537*    Recent Labs  12/25/15 0510 12/26/15 0410  NA 125* 126*  K 4.4 4.4  CL 88* 88*  GLUCOSE 98 100*  BUN 20 22*  CREATININE 0.83 0.80  CALCIUM 9.6 9.7   CBG (last 3)  No results for input(s): GLUCAP in the last 72 hours.  Wt Readings from Last 3 Encounters:  12/24/15 71.9 kg (158 lb 8.2 oz)  12/06/15 73.211 kg (161 lb 6.4 oz)    Physical Exam:  BP 109/68 mmHg  Pulse 82  Temp(Src) 98.2 F (36.8 C) (Oral)  Resp 17  Ht '6\' 6"'$  (1.981 m)  Wt 71.9 kg (158 lb 8.2 oz)  BMI 18.32 kg/m2  SpO2 98% Constitutional: He appears  cachetic. NAD. Vital signs reviewed. Head: Normocephalic. Atraumatic.  Eyes: EOM and Conj are normal.  Cardiovascular: Normal rate and regular rhythm. no murmurs rubs,  Respiratory:  Effort normal. No distress.  GI:  Soft. Bowel sounds are normal. He exhibits no distension.  Musc: pain in left hip with PROM of leg. Large nodule left chest wall (stable in size), muscle wasting diffusely.  Neurological: He is alert and oriented.   Motor: B/l UE: 4+/5 prox, 5/5 distal.  RLE: 4+/5 proximally, 5/5 distally.  LLE: (pain inhibition) 2/5 proximally, 5/5 distally.  Skin:  Incision  C/D/I.  No calf swelling or tenderness Small nodule posterior scalp without erythema or warmth, nontender, lipoma  Assessment/Plan: 1. 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, s/p IM nail placement  Stable for D/C today F/u Onc in [place]  See D/C summary See D/C instructions Function:  Bathing Bathing position   Position: Bed  Bathing parts Body parts bathed by patient: Right arm, Left arm, Chest, Abdomen, Front perineal area, Right upper leg, Left upper leg Body parts bathed by helper: Buttocks, Right lower leg, Left lower leg, Back  Bathing assist Assist Level: Touching or steadying assistance(Pt > 75%)      Upper Body Dressing/Undressing Upper body dressing   What is the patient wearing?: Pull over shirt/dress     Pull over shirt/dress - Perfomed by patient: Thread/unthread right sleeve, Thread/unthread left sleeve, Put head through opening Pull over  shirt/dress - Perfomed by helper: Pull shirt over trunk        Upper body assist Assist Level: Touching or steadying assistance(Pt > 75%)   Set up : To obtain clothing/put away  Lower Body Dressing/Undressing Lower body dressing   What is the patient wearing?: Pants Underwear - Performed by patient: Thread/unthread right underwear leg, Thread/unthread left underwear leg, Pull underwear  up/down Underwear - Performed by helper: Thread/unthread right underwear leg, Thread/unthread left underwear leg, Pull underwear up/down Pants- Performed by patient: Thread/unthread right pants leg, Fasten/unfasten pants, Pull pants up/down Pants- Performed by helper: Thread/unthread left pants leg Non-skid slipper socks- Performed by patient: Don/doff right sock, Don/doff left sock     Socks - Performed by helper: Don/doff right sock, Don/doff left sock   Shoes - Performed by helper: Don/doff right shoe, Don/doff left shoe, Fasten right, Fasten left          Lower body assist Assist for lower body dressing: Touching or steadying assistance (Pt > 75%)      Toileting Toileting Toileting activity did not occur: No continent bowel/bladder event Toileting steps completed by patient: Performs perineal hygiene Toileting steps completed by helper: Adjust clothing prior to toileting, Performs perineal hygiene, Adjust clothing after toileting (per Malachi Carl, NT)    Toileting assist     Transfers Chair/bed transfer   Chair/bed transfer method: Squat pivot Chair/bed transfer assist level: Supervision or verbal cues Chair/bed transfer assistive device: Armrests     Locomotion Ambulation     Max distance: 41f Assist level: Supervision or verbal cues   Wheelchair   Type: Manual Max wheelchair distance: 1536fAssist Level: No help, No cues, assistive device, takes more than reasonable amount of time  Cognition Comprehension Comprehension assist level: Understands complex 90% of the time/cues 10% of the time  Expression Expression assist level: Expresses complex 90% of the time/cues < 10% of the time  Social Interaction Social Interaction assist level: Interacts appropriately with others with medication or extra time (anti-anxiety, antidepressant).  Problem Solving Problem solving assist level: Solves basic 90% of the time/requires cueing < 10% of the time  Memory Memory assist  level: Recognizes or recalls 90% of the time/requires cueing < 10% of the time     Medical Problem List and Plan: 1. 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 Status post intramedullary nail placement 12/09/2015.   Weightbearing as tolerated  Continue CIR, plan for DC today  No recorded results to date in EHR for final results of biopsy that were sent off for adenocarcinoma   Left chest wall mass stable in size  Left message with Heme/Onc in [place], per pt request, still awaiting call back from physician 2. DVT Prophylaxis/Anticoagulation: SCDs.   Vascular study negative for DVT on 5/17 3. Pain Management: OxyContin sustained release 40 mg every 12 hours, oxycodone and Robaxin as needed.   Monitor with increased mobility, will increase as necessary   -added lidoderm patch posterior to nodule on left chest wall 4. Mood: team to provide ego support. Patient encouraged to be open with team regarding any need for support, counseling, etc 5. Neuropsych: This patient is capable of making decisions on his own behalf. 6. Skin/Wound Care: Routine skin checks 7. Fluids/Electrolytes/Nutrition: encourage po   Hyponatremia: 126 on 5/26, stable, salt tabs ordered on 5/25  Likely secondary to CA. 8. Acute blood loss anemia.   Hb 8.4 on 5/25  Hemoccult stools Remain pending 9. Constipation. Laxative assistance  10. Tobacco abuse. Counseling 11. Hypoalbuminemia:  Supplement started on 5/17 12. Posterior scalp lipoma  Stable in size, nontender, no warmth, no erythema  Will continue monitor for changes 13. Leukocytosis: Afebrile  Chest x-ray from 5/25 unremarkable for acute process  UA from 5/26 relatively unremarkable, urine culture pending   LOS (Days) 11 A FACE TO FACE EVALUATION WAS PERFORMED  [name] 12/27/2015 8:17 AM

## 2015-12-27 NOTE — Progress Notes (Signed)
Physical Therapy Discharge Summary  Patient Details  Name: Marvin Hardy MRN: [id number] Date of Birth: 1960/12/02  Today's Date: 12/27/2015 PT Individual Time:  -   [id number] Individual treatment time: 60 min     Patient has met 7 of 10 long term goals due to improved activity tolerance, improved balance, improved postural control, increased strength, increased range of motion and ability to compensate for deficits.  Patient to discharge at a wheelchair level Modified Independent.   Patient's care partner requires assistance to provide the necessary physical assistance at discharge.  Reasons goals not met: Patient continues to experience significant pain in LLE preventing amulation of >25f as well as WC mobility in community environment.   Recommendation:  Patient will benefit from ongoing skilled PT services in home health setting to continue to advance safe functional mobility, address ongoing impairments in Strength, balance, endurance, bed mobility, gait, WC mobility, and minimize fall risk.  Equipment: Wheelchair. Elevating Leg rests. Knee immobilizer  Reasons for discharge: treatment goals met and discharge from hospital  Patient/family agrees with progress made and goals achieved: Yes   PT treatment:  Bed mobility with min A with LLE positioning management. PT instructed patient in application of KI for improved control for L knee with transfers. Bed<>WC transfer with supervision A using squat pivot technique Patient and brother educated in safe ascent/descent of stairs using one rail and proper step to gait pattern to decrease pain on LLE and improved success. They were also educated in safes ascent and descent of curb with WC with anterior ascent and posterior descent. Patient's brother was able to demonstrate with min cues from PT.  Patient performed WC mobility for 2074fin controlled environment without cues from PT.   Patient instructed in car transfer into SUV to d/c. PT  provided min-mod A for LLE management and max cues for safe transfer into car and transition from one captain's chair to another to allow LLE to be supported. Sit >stand prior to transfer into car performed with supervision from PT.   Patient left in car with family for D/c from hospital.   PT Discharge Precautions/Restrictions Precautions Precautions: Fall Restrictions Weight Bearing Restrictions: Yes LLE Weight Bearing: Weight bearing as tolerated Vital Signs Therapy Vitals Temp: 98.2 F (36.8 C) Temp Source: Oral Pulse Rate: 82 Resp: 17 BP: 109/68 mmHg Patient Position (if appropriate): Lying Oxygen Therapy SpO2: 98 % O2 Device: Not Delivered Pain   8/10 in LLE hip and knee Vision/Perception     Cognition Orientation Level: Oriented X4 Safety/Judgment: Appears intact Sensation Sensation Light Touch: Appears Intact Stereognosis: Appears Intact Hot/Cold: Appears Intact Proprioception: Appears Intact Coordination Gross Motor Movements are Fluid and Coordinated: No Fine Motor Movements are Fluid and Coordinated: Yes Coordination and Movement Description: difficulty with coordination in left Le due to pain Motor  Motor Motor: Within Functional Limits Motor - Skilled Clinical Observations: generalized weakness and continued acute pain  Mobility Bed Mobility Bed Mobility: Rolling Right;Rolling Left;Supine to Sit;Sit to Supine Rolling Right: 4: Min guard Rolling Left: 4: Min guard Supine to Sit: 4: Min assist Supine to Sit Details: Verbal cues for sequencing;Verbal cues for technique;Verbal cues for precautions/safety;Visual cues/gestures for sequencing;Tactile cues for weight shifting;Tactile cues for placement;Verbal cues for safe use of DME/AE;Manual facilitation for weight shifting;Manual facilitation for placement Sit to Supine: 4: Min assist Sit to Supine - Details: Verbal cues for sequencing;Verbal cues for technique;Verbal cues for precautions/safety;Verbal cues  for safe use of DME/AE;Manual facilitation for weight shifting;Tactile cues for weight  shifting;Manual facilitation for placement;Manual facilitation for weight bearing Transfers Transfers: Yes Sit to Stand: 5: Supervision Sit to Stand Details: Verbal cues for sequencing;Verbal cues for technique;Verbal cues for precautions/safety;Verbal cues for safe use of DME/AE Stand to Sit: 5: Supervision Stand to Sit Details (indicate cue type and reason): Verbal cues for sequencing;Verbal cues for precautions/safety;Verbal cues for technique;Verbal cues for gait pattern;Verbal cues for safe use of DME/AE Stand Pivot Transfers: 5: Supervision Stand Pivot Transfer Details: Verbal cues for sequencing;Verbal cues for technique;Verbal cues for precautions/safety;Verbal cues for safe use of DME/AE;Verbal cues for gait pattern Squat Pivot Transfers: 5: Supervision Squat Pivot Transfer Details: Verbal cues for sequencing;Verbal cues for technique;Verbal cues for precautions/safety Locomotion  Ambulation Ambulation: Yes Ambulation/Gait Assistance: 4: Min assist Ambulation Distance (Feet): 30 Feet Assistive device: Rolling walker Ambulation/Gait Assistance Details: Verbal cues for sequencing;Verbal cues for technique;Verbal cues for precautions/safety;Verbal cues for gait pattern;Verbal cues for safe use of DME/AE;Tactile cues for weight shifting Gait Gait: Yes Gait Pattern: Impaired Gait Pattern: Step-to pattern;Decreased step length - right;Antalgic;Abducted - left;Poor foot clearance - left;Poor foot clearance - right Stairs / Additional Locomotion Stairs: Yes Stairs Assistance: 4: Min guard Stairs Assistance Details: Verbal cues for sequencing;Verbal cues for technique;Tactile cues for weight shifting;Tactile cues for placement;Tactile cues for weight beaing;Verbal cues for precautions/safety;Verbal cues for gait pattern;Verbal cues for safe use of DME/AE;Manual facilitation for weight shifting Stair  Management Technique: Two rails Number of Stairs: 12 Height of Stairs: 6 Wheelchair Mobility Wheelchair Mobility: Yes Wheelchair Assistance: 6: Modified independent (Device/Increase time) Wheelchair Assistance Details: Verbal cues for technique;Verbal cues for Information systems manager: Both upper extremities Wheelchair Parts Management: Supervision/cueing Distance: 119f  Trunk/Postural Assessment  Cervical Assessment Cervical Assessment: Within Functional Limits Thoracic Assessment Thoracic Assessment: Within Functional Limits (has large mass on left side of trunk) Lumbar Assessment Lumbar Assessment: Within Functional Limits Postural Control Postural Control: Within Functional Limits  Balance Balance Balance Assessed: Yes Dynamic Sitting Balance Dynamic Sitting - Balance Support: During functional activity;Bilateral upper extremity supported Dynamic Sitting - Level of Assistance: 6: Modified independent (Device/Increase time) Static Standing Balance Static Standing - Balance Support: During functional activity;No upper extremity supported Static Standing - Level of Assistance: 5: Stand by assistance Dynamic Standing Balance Dynamic Standing - Balance Support: During functional activity;Bilateral upper extremity supported Dynamic Standing - Level of Assistance: 4: Min assist Dynamic Standing - Balance Activities: Lateral lean/weight shifting;Reaching for objects Extremity Assessment      RLE Assessment RLE Assessment: Within Functional Limits LLE Assessment LLE Assessment: Exceptions to WFL LLE AROM (degrees) LLE Overall AROM Comments: Lacking ~ 5 degrees terminal knee extension in sitting. all other motions tested WNL LLE Strength LLE Overall Strength Comments: Grossly 3+/5 with significant increase in pain with all movent.     See Function Navigator for Current Functional Status.  ALorie Phenix5/27/2017, 7:34 AM

## 2015-12-27 NOTE — Progress Notes (Signed)
12/27/15 1105 nursing Patient to be discharged by physical therapist today; family in room RN informed of the plan. Discharge papers done yesterday per patient. RN checked discharge papers and prescription and complete no further questions from family. RN checked with social work re: question of walker and wheelchair by family. Per social work patients girlfriend has the wheelchair and walker. RN explained to family. No further questions.

## 2015-12-27 NOTE — Plan of Care (Signed)
Problem: RH Ambulation Goal: LTG Patient will ambulate in controlled environment (PT) LTG: Patient will ambulate in a controlled environment, # of feet with assistance (PT).  Outcome: Not Met (add Reason) Increased pain in patient LLE prevented gait with RW of >36f  Goal: LTG Patient will ambulate in home environment (PT) LTG: Patient will ambulate in home environment, # of feet with assistance (PT).  Outcome: Not Met (add Reason) Increased pain in patient's LLE prevented gait with RW of >338f  Problem: RH Wheelchair Mobility Goal: LTG Patient will propel w/c in community environment (PT) LTG: Patient will propel wheelchair in community environment, # of feet with assist (PT)  Outcome: Not Met (add Reason) Increased pain in patient's LLE prevented WC mobility in uneven surface of >5066f

## 2015-12-27 NOTE — Progress Notes (Signed)
Staples removed from left hip surgical incisions as ordered.  No drainage from areas.  No redness or tenderness.  Patient tolerated procedure without complaint.  Areas left open to air.    Fredna Dow M

## 2016-01-07 ENCOUNTER — Ambulatory Visit: Payer: BLUE CROSS/BLUE SHIELD | Admitting: Internal Medicine

## 2016-01-07 ENCOUNTER — Telehealth: Payer: Self-pay | Admitting: *Deleted

## 2016-01-07 ENCOUNTER — Other Ambulatory Visit: Payer: BLUE CROSS/BLUE SHIELD

## 2016-01-07 NOTE — Progress Notes (Signed)
This encounter was created in error - please disregard.

## 2016-01-07 NOTE — Telephone Encounter (Signed)
Oncology Nurse Navigator Documentation  Oncology Nurse Navigator Flowsheets 01/07/2016  Patient Visit Type Other  Treatment Phase Abnormal Scans  Barriers/Navigation Needs Coordination of Care  Interventions Coordination of Care  Coordination of Care Appts  Acuity Level 1  Time Spent with Patient 15   Patient was a no show today.  I called to check on him.  I left my name and phone number to call.

## 2016-01-13 ENCOUNTER — Telehealth: Payer: Self-pay | Admitting: *Deleted

## 2016-01-13 NOTE — Telephone Encounter (Signed)
Oncology Nurse Navigator Documentation  Oncology Nurse Navigator Flowsheets 01/13/2016  Navigator Encounter Type Telephone  Telephone Outgoing Call  Treatment Phase Pre-Tx/Tx Discussion  Barriers/Navigation Needs Coordination of Care  Interventions Coordination of Care  Coordination of Care Appts  Acuity Level 1  Time Spent with Patient 15   I called patient today to re-schedule.  I was unable to reach or leave a vm message.

## 2016-01-22 ENCOUNTER — Telehealth: Payer: Self-pay | Admitting: *Deleted

## 2016-01-22 NOTE — Telephone Encounter (Signed)
Oncology Nurse Navigator Documentation  Oncology Nurse Navigator Flowsheets 01/22/2016  Navigator Encounter Type Telephone  Telephone Outgoing Call  Treatment Phase Pre-Tx/Tx Discussion  Barriers/Navigation Needs Coordination of Care  Interventions Coordination of Care  Coordination of Care Appts  Acuity Level 1  Time Spent with Patient 15   I called to re-schedule patient.  I was unable to reach nor leave a message.  I will update referring physician.

## 2016-01-28 ENCOUNTER — Telehealth: Payer: Self-pay | Admitting: *Deleted

## 2016-01-28 ENCOUNTER — Encounter: Payer: Self-pay | Admitting: *Deleted

## 2016-01-28 NOTE — Progress Notes (Signed)
I was able to reach to wife.  She stated Marvin Hardy passed.  I stated we were sorry to hear of her loss and I will update Dr. Beryle Beams.

## 2016-01-28 NOTE — Telephone Encounter (Signed)
Oncology Nurse Navigator Documentation  Oncology Nurse Navigator Flowsheets 01/28/2016  Navigator Encounter Type Telephone  Telephone Outgoing Call  Treatment Phase Pre-Tx/Tx Discussion  Barriers/Navigation Needs Coordination of Care  Interventions Coordination of Care  Coordination of Care Appts  Acuity Level 2  Time Spent with Patient 54   I called daughter to get in touch with father.  She asked that I call the mother.  I called the number but no one answered.  I called the daughter back and she did not answer the phone and disconnected the call.

## 2016-01-28 NOTE — Telephone Encounter (Signed)
Oncology Nurse Navigator Documentation  Oncology Nurse Navigator Flowsheets 01/28/2016  Navigator Encounter Type Telephone  Telephone Outgoing Call  Treatment Phase Pre-Tx/Tx Discussion  Barriers/Navigation Needs Coordination of Care  Interventions Coordination of Care  Coordination of Care Appts  Acuity Level 1  Time Spent with Patient 15   I called to contact Mr. Penson, I was unable to reach.  Dr. Beryle Beams and Dr. Julien Nordmann aware.

## 2016-01-31 DEATH — deceased

## 2017-01-31 IMAGING — US US SCROTUM
1 series · 13 of 25 positions shown · non-contrast
Comparison: None.

CLINICAL DATA: Right scrotal swelling for 2 months

EXAM:
ULTRASOUND OF SCROTUM
TECHNIQUE: Complete ultrasound examination of the testicles, epididymis, and
other scrotal structures was performed.

[Series 1: us scrotum · 0.09mm/px · 13 of 44 slices shown]
[im 1/44]
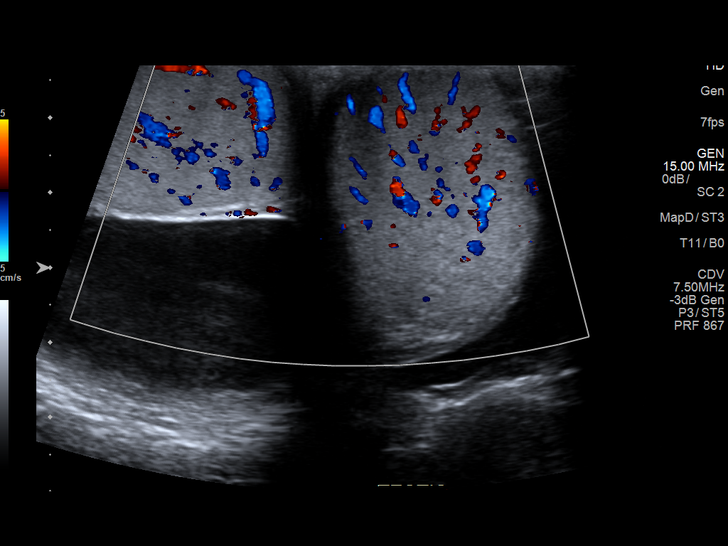
[im 4/44]
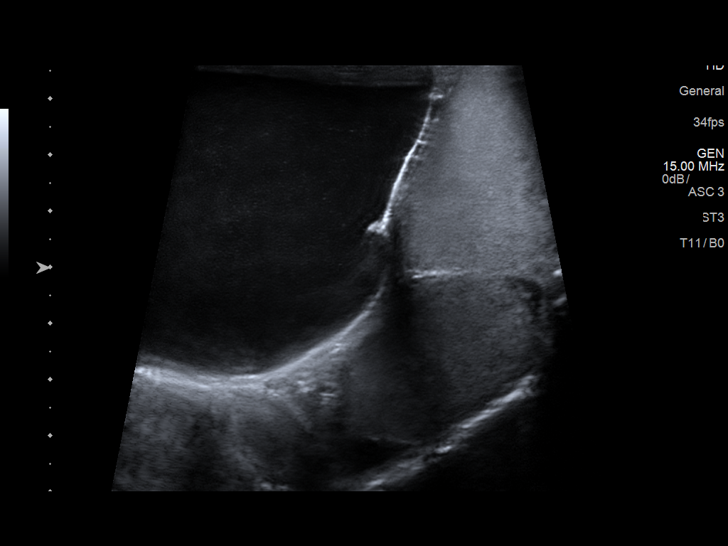
[im 8/44]
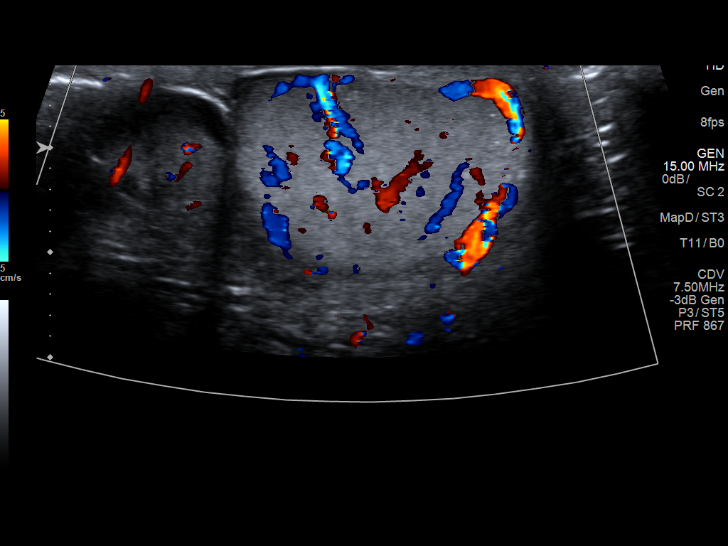
[im 11/44]
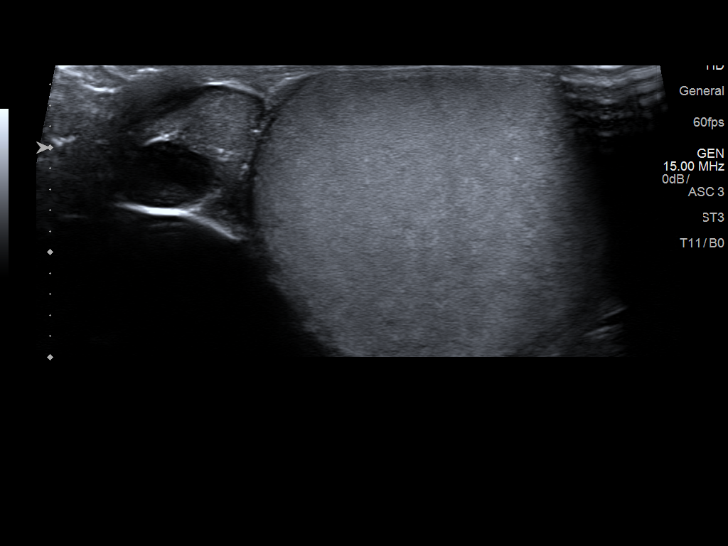
[im 15/44]
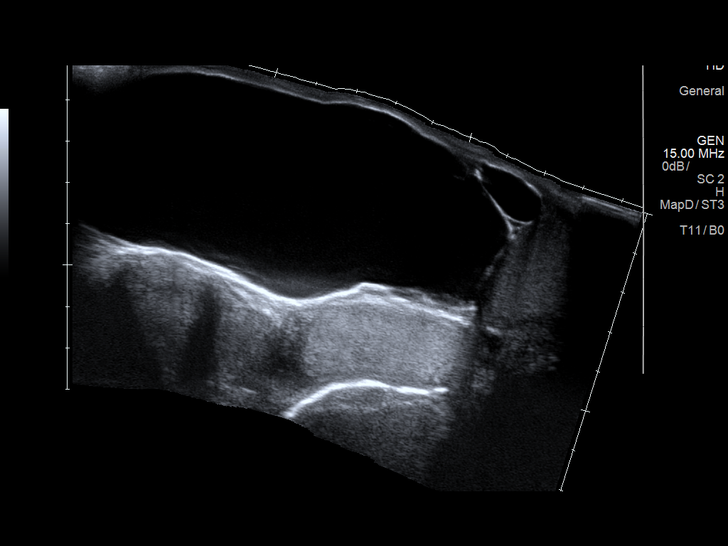
[im 18/44]
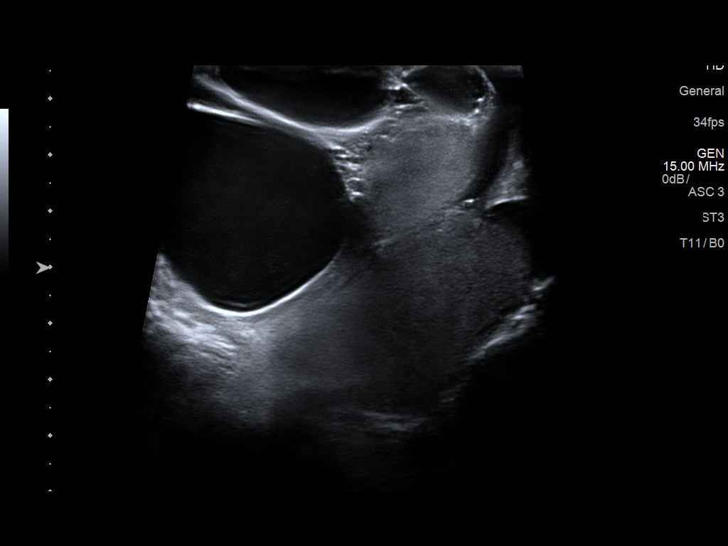
[im 22/44]
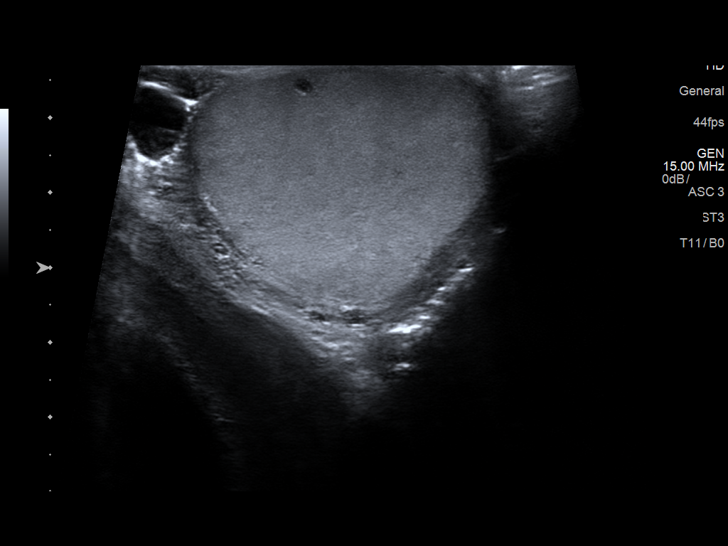
[im 26/44]
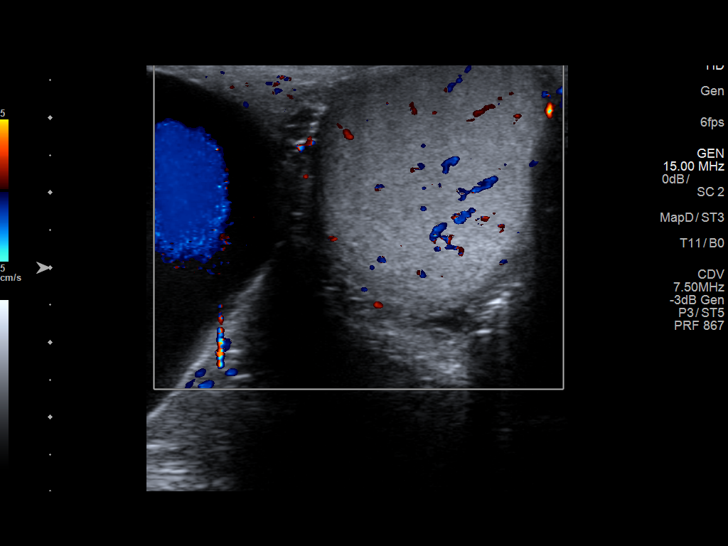
[im 29/44]
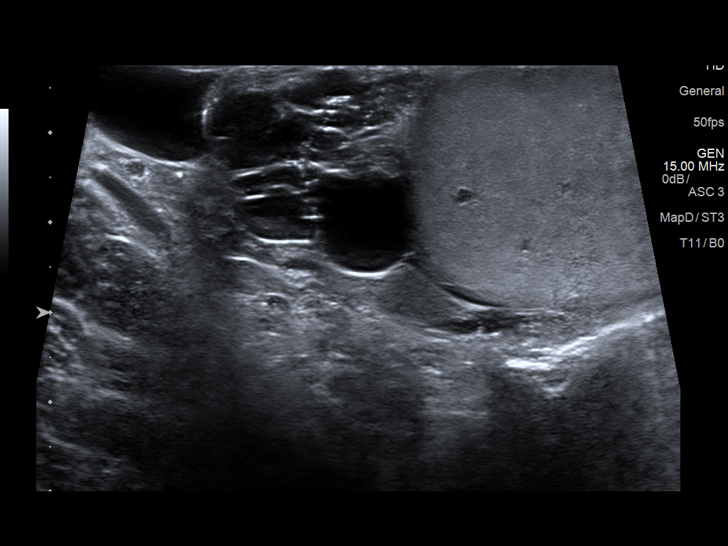
[im 33/44]
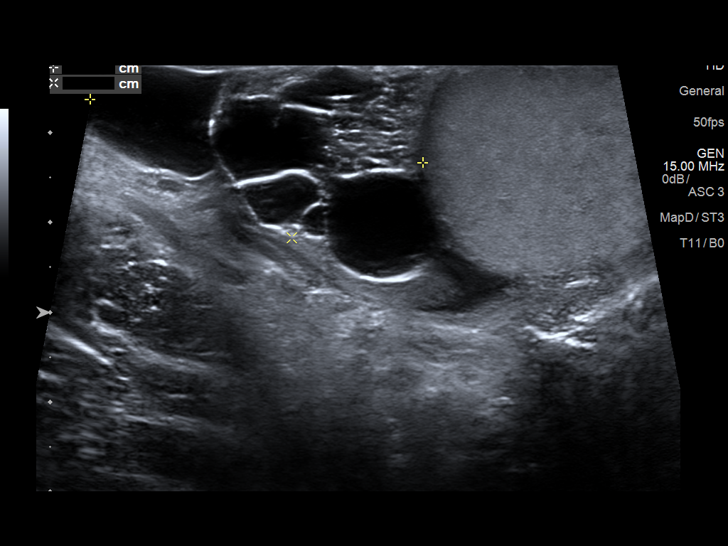
[im 36/44]
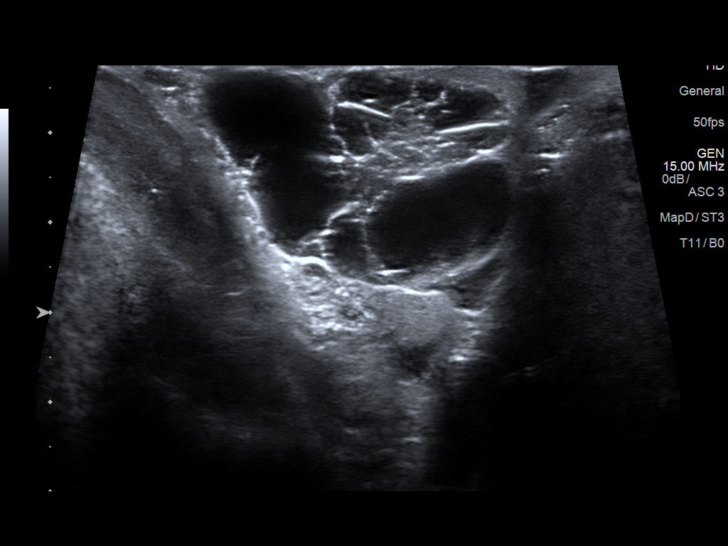
[im 40/44]
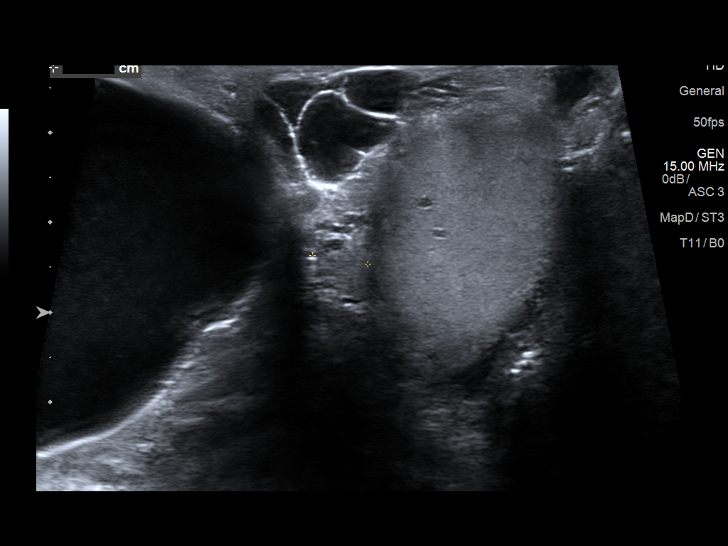
[im 44/44]
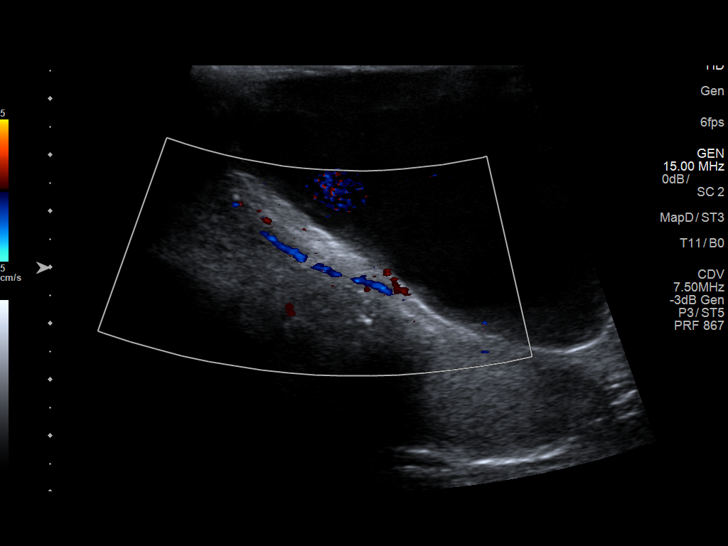

[13 of 25 positions shown; findings below may reference images not displayed]

FINDINGS: Right testicle

Measurements: 3.5 x 3.1 x 2.7 cm. There is a 3 x 3 x 2 mm cyst in
the right testis. No noncystic mass or microlithiasis visualized.
Color flow is seen in the right testis.

Left testicle

Measurements: 4.3 x 3.5 x 2.8 cm. There is a 3 x 3 x 2 mm cyst in
the left testis. No noncystic mass or microlithiasis visualized.
Color flow is seen in the left testis.

Right epididymis:  Normal in size and appearance.

Left epididymis: No inflammatory focus. There are multiple cysts
arising from the epididymal head, largest measuring 1 x 1 x 0.8 cm.

Hydrocele: There is a large hydrocele on the right. There is a much
smaller hydrocele on the left.

Varicocele:  None visualized.

No scrotal wall thickening or scrotal abscess.
IMPRESSION: Sizable right hydrocele of uncertain etiology. Small left hydrocele.

There are several epididymal head cysts/spermatoceles on the left.
There is no epididymal inflammation on either side.

There is a tiny cyst in each testis. Etiology uncertain. No
noncystic testicular mass is identified. Given these small
intratesticular cysts, a followup study in 3-4 months to assess for
stability would be reasonable. Color flow is appreciated in each
testis.

## 2017-08-21 IMAGING — CT CT CHEST W/ CM
1 series · 15 of 29 positions shown, 19 images · IV contrast (iopamidol)
Comparison: None.

CLINICAL DATA: Multiple myeloma.  Fell today.

EXAM:
CT CHEST, ABDOMEN, AND PELVIS WITH CONTRAST
TECHNIQUE: Multidetector CT imaging of the chest, abdomen and pelvis was
performed following the standard protocol during bolus
administration of intravenous contrast.
CONTRAST:  100mL [id number] IOPAMIDOL (SR95RT-E11) INJECTION 61%

[Series 401: delays, idose (3) · axial · 0.60mm/px · z∈[-622,-462]mm · 15 of 36 slices shown, 19 images]
[im 2/36  mediastinal]
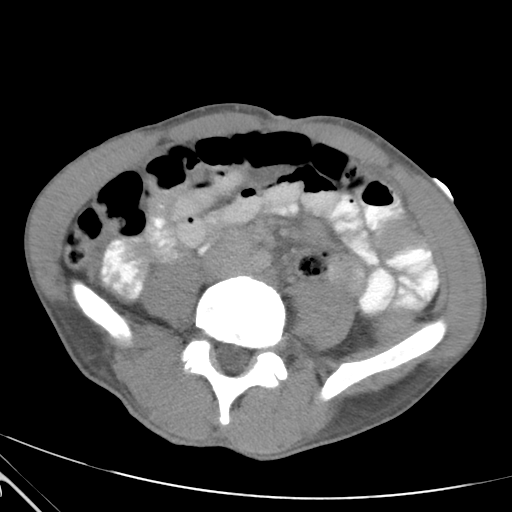
[im 2/36  lung]
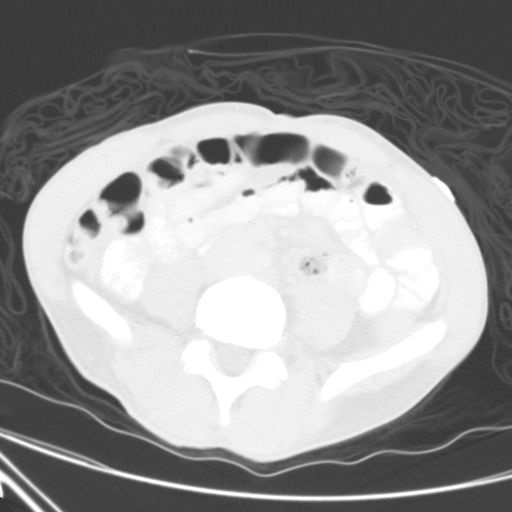
[im 4/36  lung]
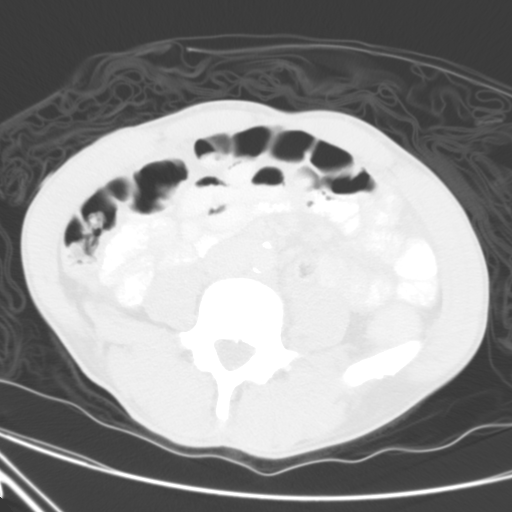
[im 7/36  lung]
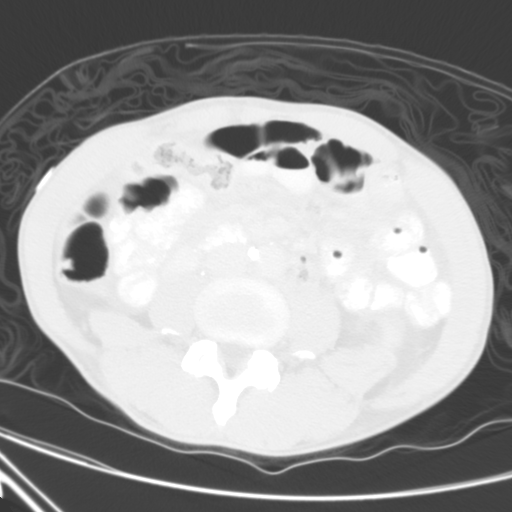
[im 10/36  lung]
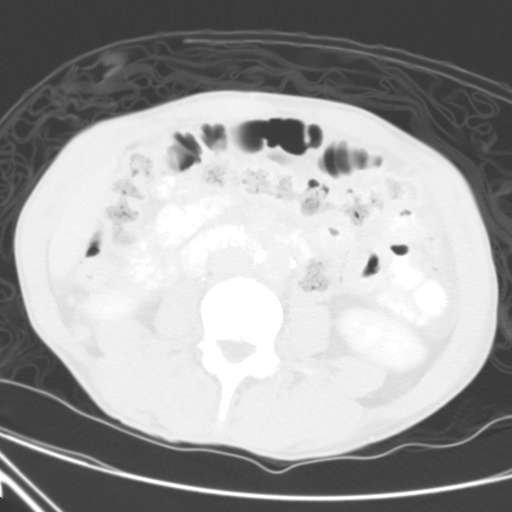
[im 12/36  mediastinal]
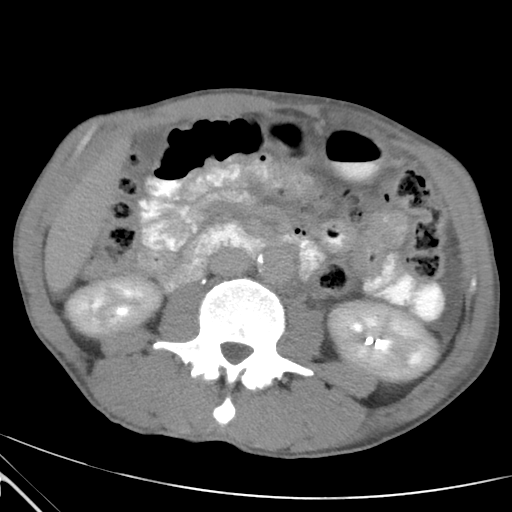
[im 12/36  lung]
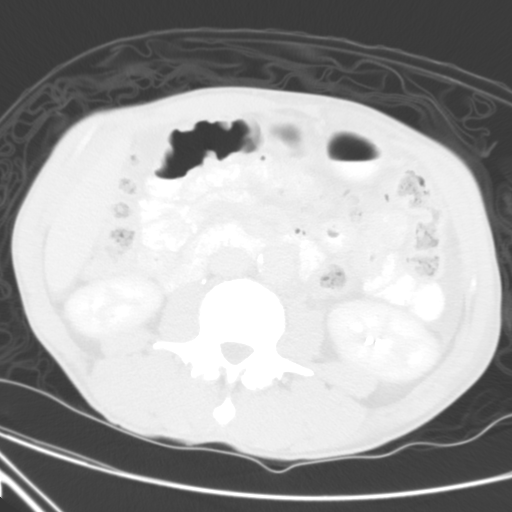
[im 13/36  lung]
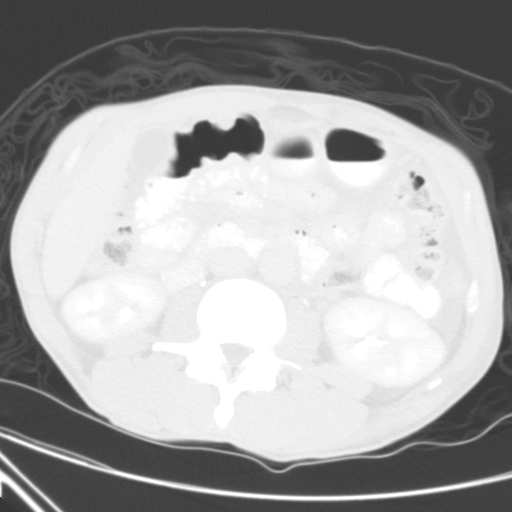
[im 16/36  lung]
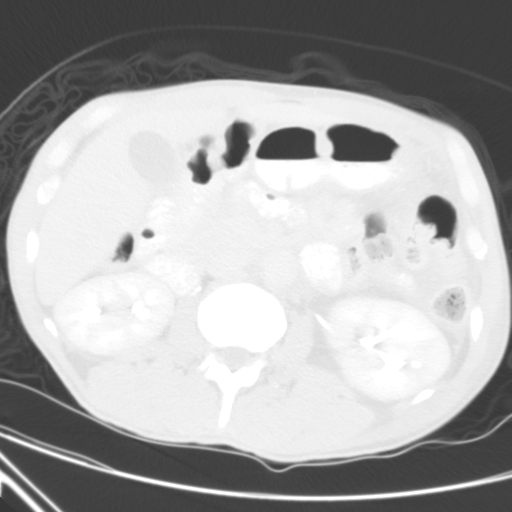
[im 19/36  lung]
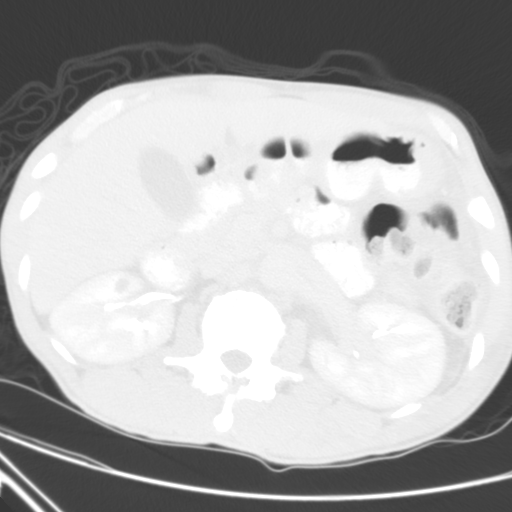
[im 21/36  mediastinal]
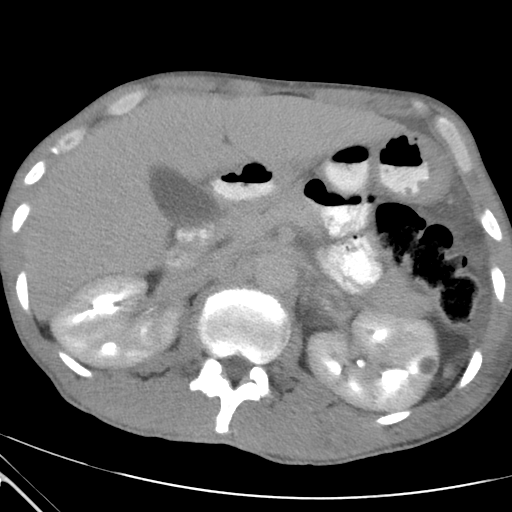
[im 21/36  lung]
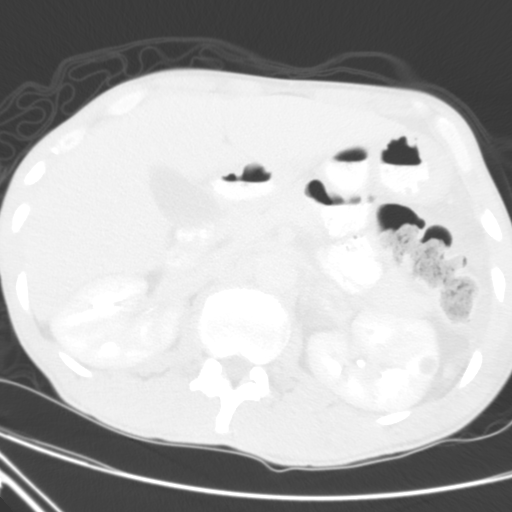
[im 23/36  lung]
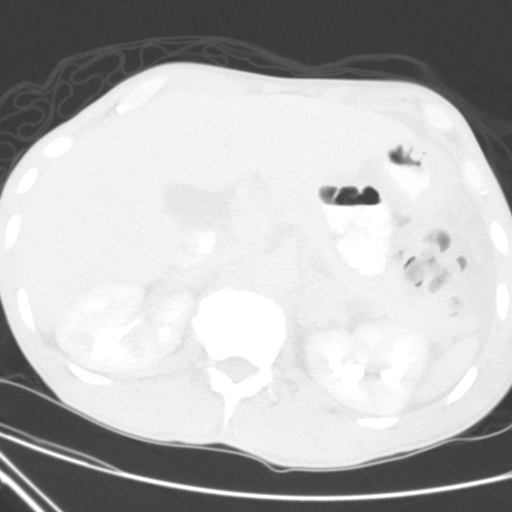
[im 24/36  lung]
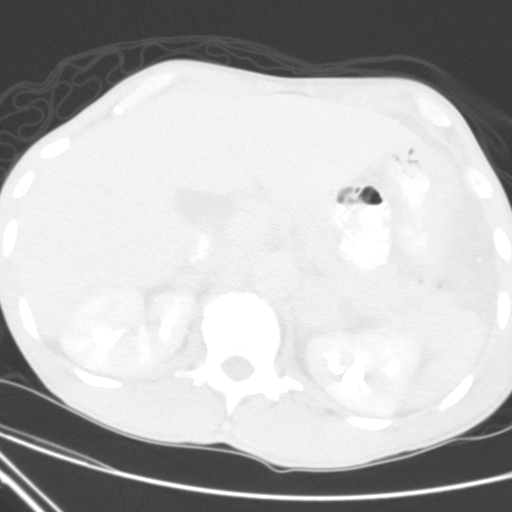
[im 26/36  lung]
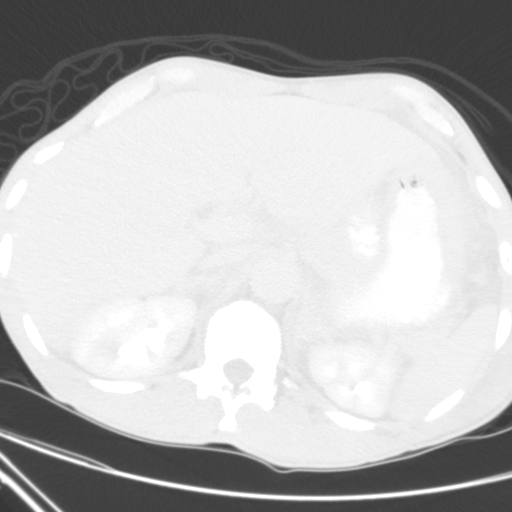
[im 29/36  mediastinal]
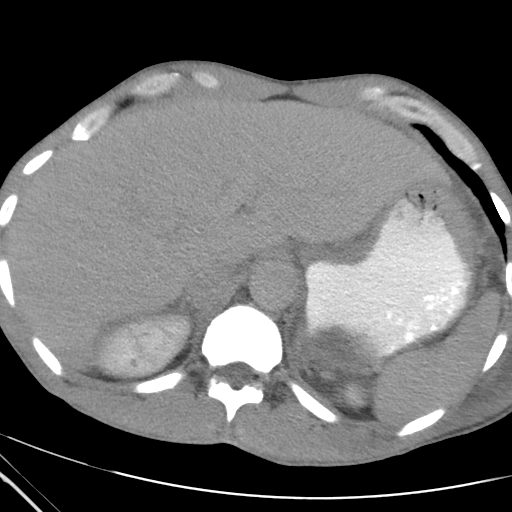
[im 29/36  lung]
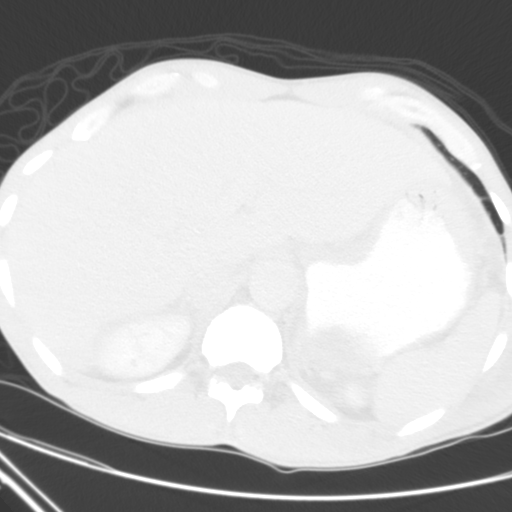
[im 32/36  lung]
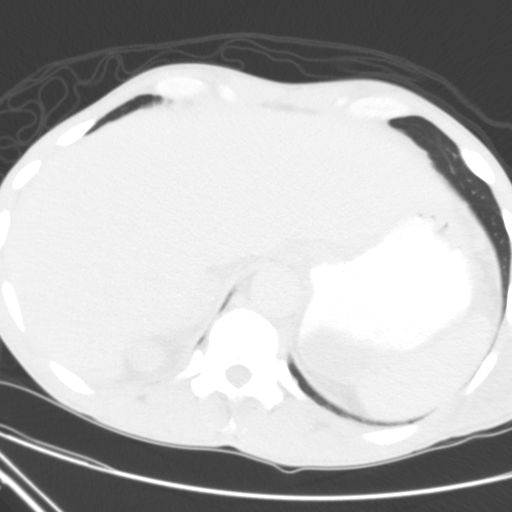
[im 34/36  lung]
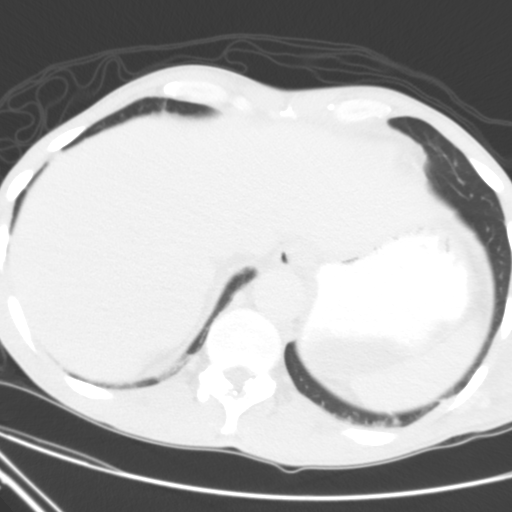

[15 of 29 positions shown; findings below may reference images not displayed]

FINDINGS: CT CHEST

No pneumothorax. No effusions. No acute fracture. No intrathoracic
vascular injury.

No mediastinal or hilar adenopathy. Axillary regions are
unremarkable.

There is a 2.5 x 3.6 cm mass in the posterior basal segment of the
right lower lobe, suspicious for neoplasm. Extensive centrilobular
emphysematous changes are present in the upper lobes.

CT ABDOMEN AND PELVIS

No acute traumatic findings. No peritoneal blood or free air. Liver,
spleen, pancreas and kidneys are unremarkable and intact. Aorta and
IVC are normal in caliber, intact.

There are adrenal masses bilaterally, measuring approximately 2 cm.
There also is a 2.2 cm mass at the upper pole the right kidney which
might represent a far posterior right adrenal nodule. The adrenals
are poorly defined due to the marked paucity of surrounding fat.

Bowel is unremarkable.

Small volume ascites.

No acute fracture is evident in the chest, abdomen or pelvis.

There is a destructive bone lesion with associated soft tissue mass
at the anterior aspect of the left femur greater trochanter, seen to
best advantage on coronal images 76 series 304. This soft tissue
component measures 1.9 x 2.5 cm. There also are lytic defects of the
left femoral subcapital and intertrochanteric regions which are
worrisome for risk of pathologic fracture.
IMPRESSION: 1. No evidence of acute traumatic injury in the chest, abdomen or
pelvis.
[DATE] x 3.6 cm right lower lobe lung mass, suspicious for neoplasm.
3. Bilateral adrenal masses. There also is a 2.2 cm right suprarenal
mass more posteriorly which may represent an additional adrenal
mass.
4. Lytic lesions of the left femoral neck and intertrochanteric
region, worrisome for risk for pathologic fracture.

## 2017-08-21 IMAGING — CR DG FEMUR 2+V*L*
5 series · 5 of 5 positions shown · non-contrast
Comparison: None.

CLINICAL DATA: Patient twisted leg with mid femur pain. Initial
encounter.

EXAM:
LEFT FEMUR 2 VIEWS

[femur ap (1 of 2)]
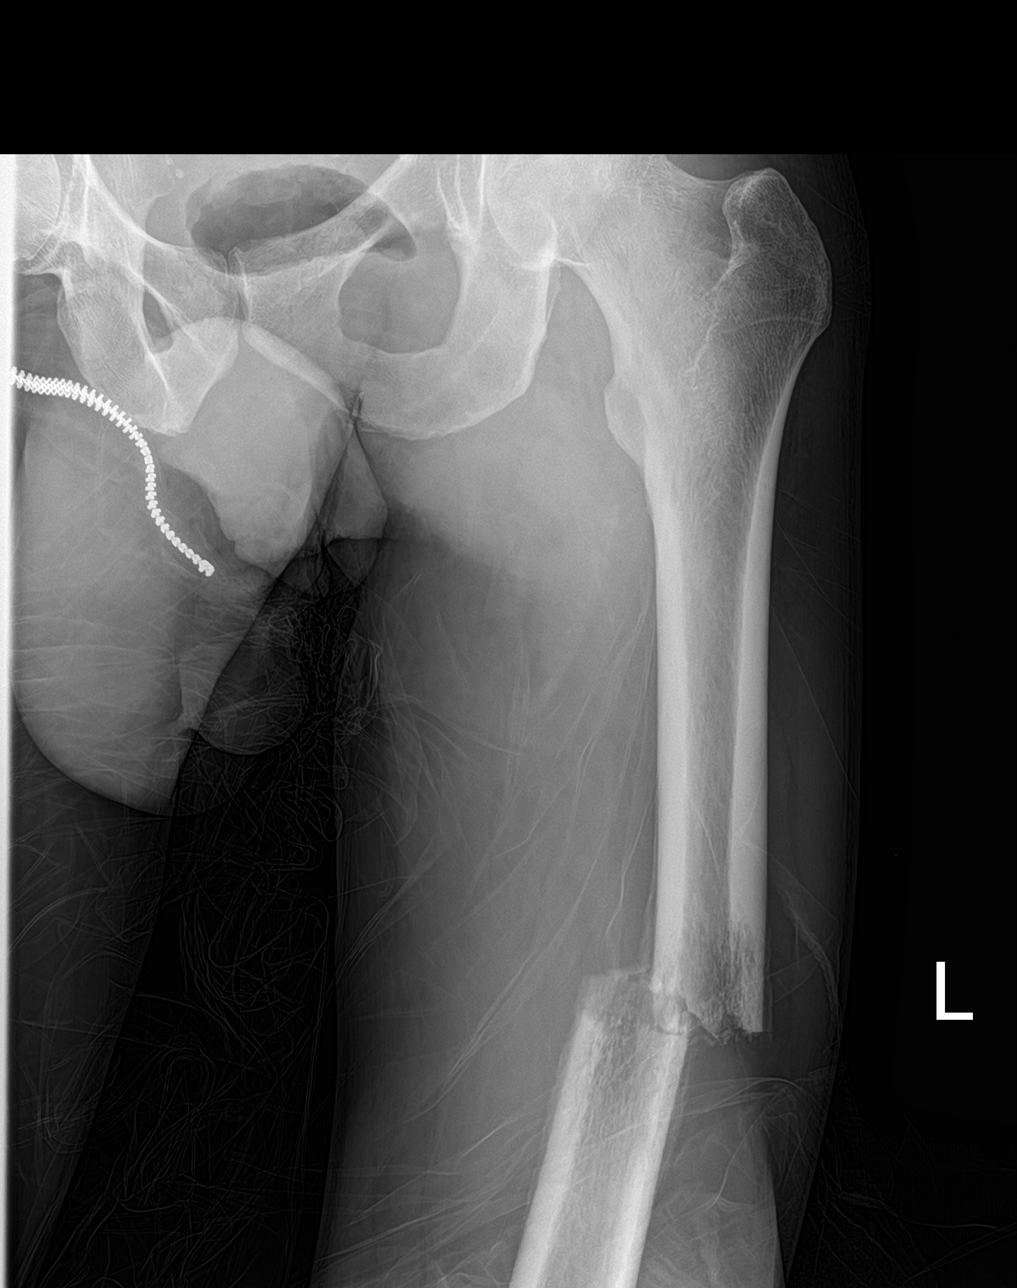

[femur ap (2 of 2)]
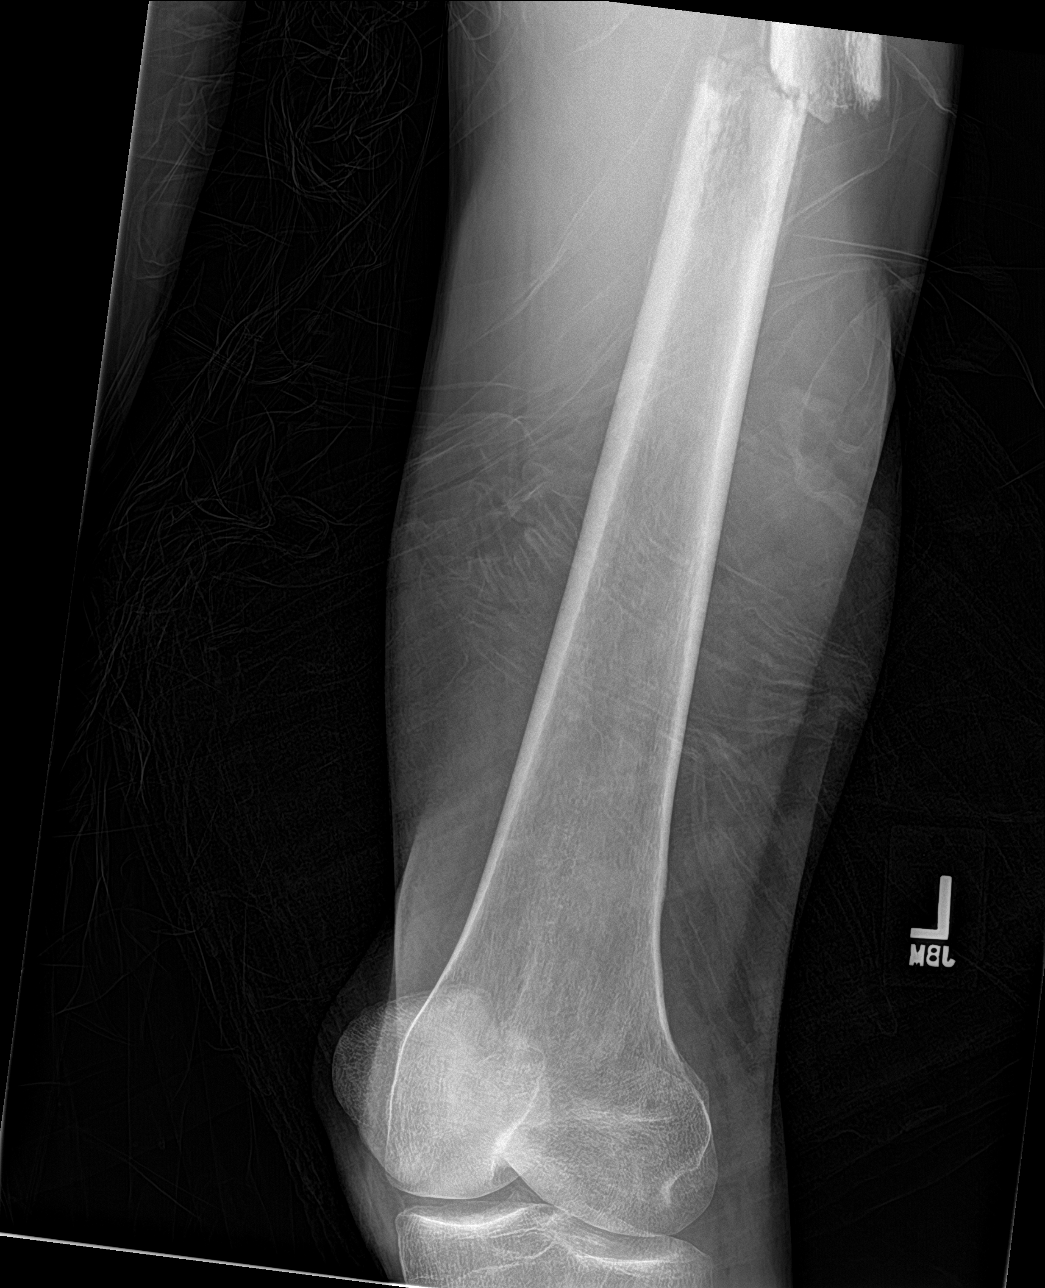

[femur lat (1 of 3)]
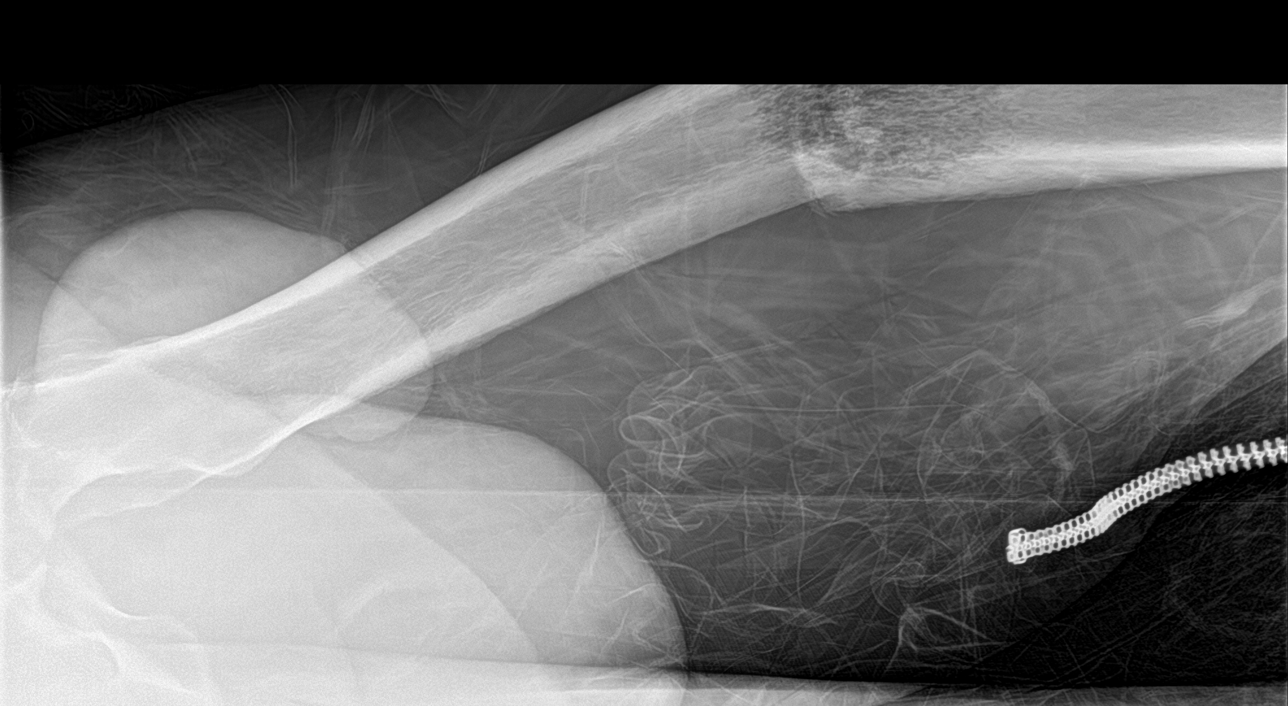

[femur lat (2 of 3)]
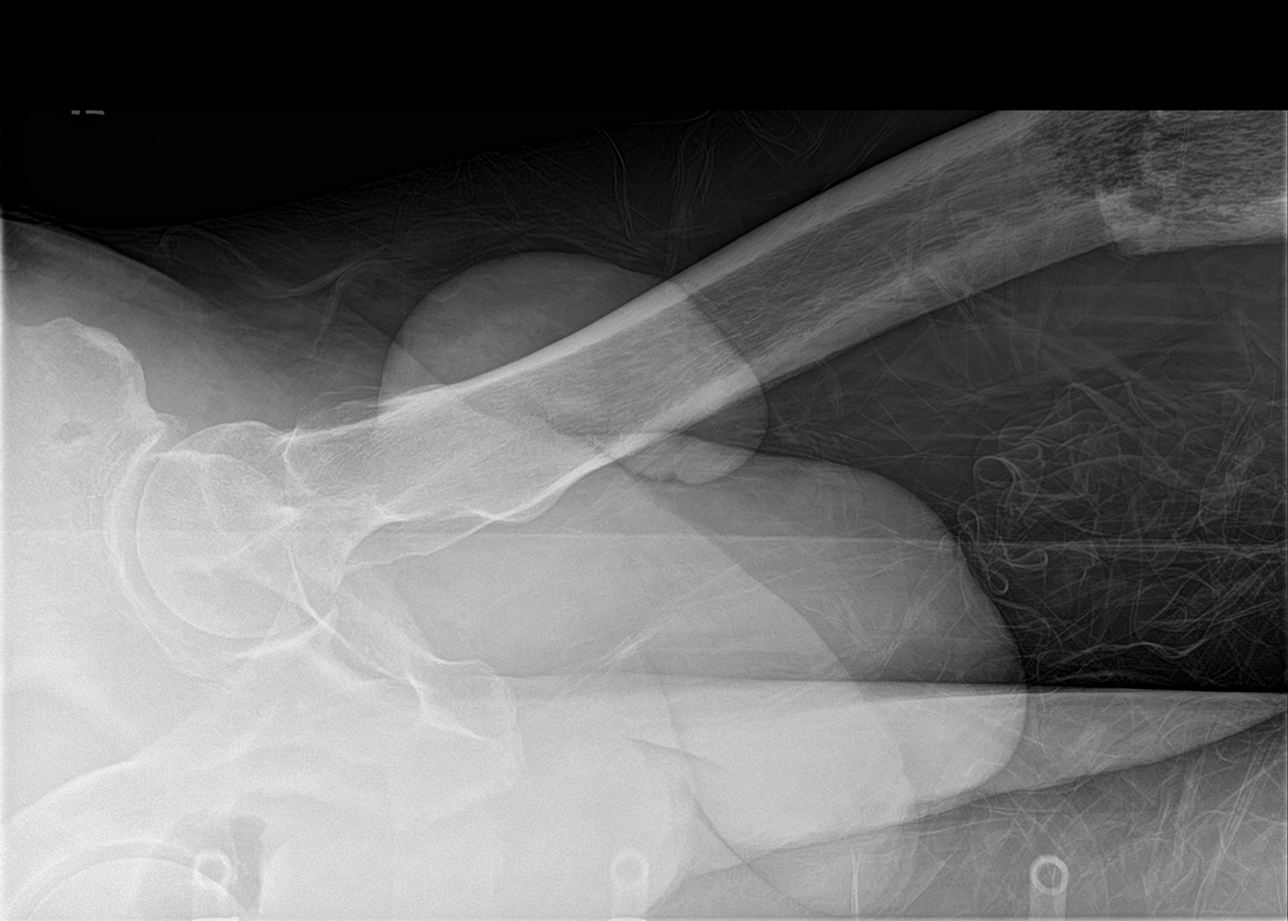

[femur lat (3 of 3)]
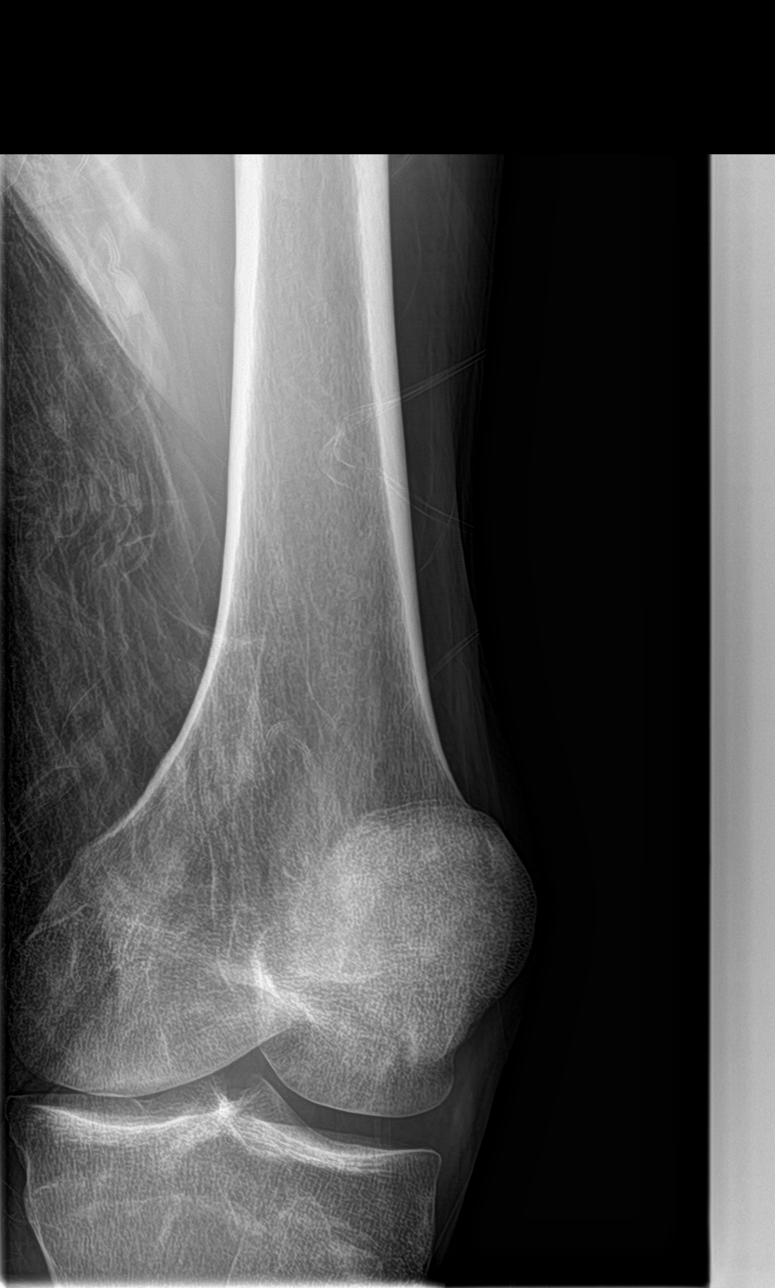

[5 of 5 positions shown; findings below may reference images not displayed]

FINDINGS: There is a transverse displaced fracture through the mid femoral
diaphysis. There is an irregular lytic lesion involving the mid
femoral diaphysis at the fracture site with overlying periosteal
reaction. Overlying soft tissue swelling.
IMPRESSION: Displaced transverse fracture through the mid femoral diaphysis.

Irregular lytic lesion and periosteal reaction involving the mid
femoral diaphysis at the fracture site is concerning for the
possibility of metastatic disease or multiple myeloma.
